# Patient Record
Sex: Male | Born: 1945 | Race: Black or African American | Hispanic: No | Marital: Married | Smoking: Never smoker
Health system: Southern US, Community
[De-identification: ages and names within clinical notes are randomized; demographics above are authoritative.]

## PROBLEM LIST (undated history)

## (undated) DIAGNOSIS — Z7401 Bed confinement status: Secondary | ICD-10-CM

## (undated) DIAGNOSIS — H409 Unspecified glaucoma: Secondary | ICD-10-CM

## (undated) DIAGNOSIS — J189 Pneumonia, unspecified organism: Secondary | ICD-10-CM

## (undated) DIAGNOSIS — R4182 Altered mental status, unspecified: Secondary | ICD-10-CM

## (undated) DIAGNOSIS — E119 Type 2 diabetes mellitus without complications: Secondary | ICD-10-CM

## (undated) DIAGNOSIS — R195 Other fecal abnormalities: Secondary | ICD-10-CM

## (undated) DIAGNOSIS — F32A Depression, unspecified: Secondary | ICD-10-CM

## (undated) DIAGNOSIS — E039 Hypothyroidism, unspecified: Secondary | ICD-10-CM

## (undated) DIAGNOSIS — E162 Hypoglycemia, unspecified: Secondary | ICD-10-CM

## (undated) DIAGNOSIS — B1920 Unspecified viral hepatitis C without hepatic coma: Secondary | ICD-10-CM

## (undated) DIAGNOSIS — I639 Cerebral infarction, unspecified: Secondary | ICD-10-CM

## (undated) DIAGNOSIS — I679 Cerebrovascular disease, unspecified: Secondary | ICD-10-CM

## (undated) DIAGNOSIS — K759 Inflammatory liver disease, unspecified: Secondary | ICD-10-CM

## (undated) DIAGNOSIS — R519 Headache, unspecified: Secondary | ICD-10-CM

## (undated) DIAGNOSIS — N179 Acute kidney failure, unspecified: Secondary | ICD-10-CM

## (undated) DIAGNOSIS — E7800 Pure hypercholesterolemia, unspecified: Secondary | ICD-10-CM

## (undated) DIAGNOSIS — I6990 Unspecified sequelae of unspecified cerebrovascular disease: Secondary | ICD-10-CM

## (undated) DIAGNOSIS — I10 Essential (primary) hypertension: Secondary | ICD-10-CM

## (undated) DIAGNOSIS — N189 Chronic kidney disease, unspecified: Secondary | ICD-10-CM

## (undated) DIAGNOSIS — I5042 Chronic combined systolic (congestive) and diastolic (congestive) heart failure: Secondary | ICD-10-CM

## (undated) DIAGNOSIS — N184 Chronic kidney disease, stage 4 (severe): Secondary | ICD-10-CM

## (undated) DIAGNOSIS — K219 Gastro-esophageal reflux disease without esophagitis: Secondary | ICD-10-CM

## (undated) DIAGNOSIS — R51 Headache: Secondary | ICD-10-CM

## (undated) DIAGNOSIS — F329 Major depressive disorder, single episode, unspecified: Secondary | ICD-10-CM

## (undated) HISTORY — DX: Hypoglycemia, unspecified: E16.2

## (undated) HISTORY — DX: Chronic kidney disease, unspecified: N18.9

## (undated) HISTORY — DX: Other fecal abnormalities: R19.5

## (undated) HISTORY — DX: Unspecified sequelae of unspecified cerebrovascular disease: I69.90

## (undated) HISTORY — DX: Unspecified viral hepatitis C without hepatic coma: B19.20

## (undated) HISTORY — DX: Altered mental status, unspecified: R41.82

## (undated) HISTORY — DX: Acute kidney failure, unspecified: N17.9

## (undated) HISTORY — PX: COLON SURGERY: SHX602

## (undated) HISTORY — DX: Chronic kidney disease, stage 4 (severe): N18.4

---
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
## 1999-11-01 ENCOUNTER — Ambulatory Visit (HOSPITAL_COMMUNITY): Admission: RE | Admit: 1999-11-01 | Discharge: 1999-11-01 | Payer: Self-pay | Admitting: Surgery

## 1999-11-01 ENCOUNTER — Encounter: Payer: Self-pay | Admitting: Surgery

## 2000-02-22 ENCOUNTER — Encounter: Admission: RE | Admit: 2000-02-22 | Discharge: 2000-02-22 | Payer: Self-pay | Admitting: Nephrology

## 2000-02-22 ENCOUNTER — Encounter: Payer: Self-pay | Admitting: Nephrology

## 2000-03-21 ENCOUNTER — Ambulatory Visit (HOSPITAL_COMMUNITY): Admission: RE | Admit: 2000-03-21 | Discharge: 2000-03-21 | Payer: Self-pay | Admitting: *Deleted

## 2000-03-21 ENCOUNTER — Encounter (INDEPENDENT_AMBULATORY_CARE_PROVIDER_SITE_OTHER): Payer: Self-pay | Admitting: Specialist

## 2000-03-25 HISTORY — PX: COLONOSCOPY W/ BIOPSIES AND POLYPECTOMY: SHX1376

## 2000-04-28 ENCOUNTER — Inpatient Hospital Stay (HOSPITAL_COMMUNITY): Admission: RE | Admit: 2000-04-28 | Discharge: 2000-05-05 | Payer: Self-pay | Admitting: General Surgery

## 2000-04-28 ENCOUNTER — Encounter (INDEPENDENT_AMBULATORY_CARE_PROVIDER_SITE_OTHER): Payer: Self-pay | Admitting: *Deleted

## 2000-05-01 ENCOUNTER — Encounter (HOSPITAL_BASED_OUTPATIENT_CLINIC_OR_DEPARTMENT_OTHER): Payer: Self-pay | Admitting: General Surgery

## 2000-10-10 ENCOUNTER — Encounter: Payer: Self-pay | Admitting: Emergency Medicine

## 2000-10-10 ENCOUNTER — Emergency Department (HOSPITAL_COMMUNITY): Admission: EM | Admit: 2000-10-10 | Discharge: 2000-10-10 | Payer: Self-pay | Admitting: Emergency Medicine

## 2006-10-24 DIAGNOSIS — J189 Pneumonia, unspecified organism: Secondary | ICD-10-CM

## 2006-10-24 HISTORY — DX: Pneumonia, unspecified organism: J18.9

## 2006-11-05 ENCOUNTER — Emergency Department (HOSPITAL_COMMUNITY): Admission: EM | Admit: 2006-11-05 | Discharge: 2006-11-05 | Payer: Self-pay | Admitting: Emergency Medicine

## 2006-11-11 ENCOUNTER — Ambulatory Visit: Payer: Self-pay | Admitting: Cardiology

## 2006-11-11 ENCOUNTER — Inpatient Hospital Stay (HOSPITAL_COMMUNITY): Admission: EM | Admit: 2006-11-11 | Discharge: 2006-11-14 | Payer: Self-pay | Admitting: Emergency Medicine

## 2006-11-12 ENCOUNTER — Encounter (INDEPENDENT_AMBULATORY_CARE_PROVIDER_SITE_OTHER): Payer: Self-pay | Admitting: Internal Medicine

## 2006-11-13 ENCOUNTER — Encounter (INDEPENDENT_AMBULATORY_CARE_PROVIDER_SITE_OTHER): Payer: Self-pay | Admitting: Interventional Radiology

## 2006-11-20 ENCOUNTER — Ambulatory Visit (HOSPITAL_COMMUNITY): Admission: RE | Admit: 2006-11-20 | Discharge: 2006-11-20 | Payer: Self-pay | Admitting: Internal Medicine

## 2007-01-24 DIAGNOSIS — I639 Cerebral infarction, unspecified: Secondary | ICD-10-CM

## 2007-01-24 HISTORY — DX: Cerebral infarction, unspecified: I63.9

## 2007-02-02 ENCOUNTER — Ambulatory Visit: Payer: Self-pay | Admitting: *Deleted

## 2007-02-02 ENCOUNTER — Encounter (INDEPENDENT_AMBULATORY_CARE_PROVIDER_SITE_OTHER): Payer: Self-pay | Admitting: Internal Medicine

## 2007-02-02 ENCOUNTER — Inpatient Hospital Stay (HOSPITAL_COMMUNITY): Admission: EM | Admit: 2007-02-02 | Discharge: 2007-02-05 | Payer: Self-pay | Admitting: Emergency Medicine

## 2007-02-04 ENCOUNTER — Ambulatory Visit: Payer: Self-pay | Admitting: Physical Medicine & Rehabilitation

## 2007-02-05 ENCOUNTER — Inpatient Hospital Stay (HOSPITAL_COMMUNITY)
Admission: RE | Admit: 2007-02-05 | Discharge: 2007-02-18 | Payer: Self-pay | Admitting: Physical Medicine & Rehabilitation

## 2007-03-17 ENCOUNTER — Encounter
Admission: RE | Admit: 2007-03-17 | Discharge: 2007-06-04 | Payer: Self-pay | Admitting: Physical Medicine & Rehabilitation

## 2007-03-17 ENCOUNTER — Ambulatory Visit: Payer: Self-pay | Admitting: Physical Medicine & Rehabilitation

## 2007-04-07 ENCOUNTER — Encounter
Admission: RE | Admit: 2007-04-07 | Discharge: 2007-07-06 | Payer: Self-pay | Admitting: Physical Medicine & Rehabilitation

## 2007-05-15 ENCOUNTER — Encounter
Admission: RE | Admit: 2007-05-15 | Discharge: 2007-05-15 | Payer: Self-pay | Admitting: Physical Medicine & Rehabilitation

## 2009-10-30 ENCOUNTER — Encounter: Admission: RE | Admit: 2009-10-30 | Discharge: 2009-10-30 | Payer: Self-pay | Admitting: Infectious Diseases

## 2010-04-15 ENCOUNTER — Encounter: Payer: Self-pay | Admitting: Internal Medicine

## 2010-08-07 NOTE — H&P (Signed)
[name], [name]          ACCOUNT NO.:  [id number]   MEDICAL RECORD NO.:  [id number]          PATIENT TYPE:  INP   LOCATION:  4731                         FACILITY:  [facility]   PHYSICIAN:  Fleet Contras, M.D.    DATE OF BIRTH:  Jun 18, 1945   DATE OF ADMISSION:  11/11/2006  DATE OF DISCHARGE:                              HISTORY & PHYSICAL   PRESENTING COMPLAINTS:  Left-sided chest pain and chest congestion.   HISTORY OF PRESENTING COMPLAINT:  [name] is a 65 year old  African-American gentleman with past medical history significant for  type 2 diabetes mellitus, hypertension.  He was seen at the emergency  room on November 05, 2006 with 1-week history of progressively worsening  symptoms of pain on the left side associated with chest congestion,  cough productive of yellowish sputum as well as generally feeling  unwell, fatigued, and worn out.  He denied any specific fevers or  chills.  He stated that the pain in the chest was worse with coughing,  with deep breathing or with any movement.  At that time he was evaluated  and found to have left lower lobe infiltrate on chest x-ray and was  treated with antibiotics, Zithromax for five days as well as Vicodin for  pain.  He returned to the emergency room today with complaints of  persistent symptoms not completely resolved with the appropriate therapy  prescribed.  He denied any palpitations, nausea or vomiting or  diaphoresis.  He had no orthopnea or PND.  Repeat x-ray today showed  worsening of the left lower lobe infiltrates as well as parapneumonic  effusion on the left side.  In view of his comorbid medical conditions  and worsening of his symptoms and chest x-ray findings in spite of  outpatient therapy, he is being hospitalized for  further intravenous therapy and close monitoring.   PAST MEDICAL HISTORY:  Significant for:  1. Type 2 diabetes mellitus.  2. Hypertension systemic.  3. Glaucoma.  4. Lumbar  spondylosis.   MEDICATION:  He is on Avandia 4 mg daily, aspirin 81 mg daily, Ambien 10  mg one q.h.s. p.r.n., glipizide 5 mg b.i.d., metformin 500 mg b.i.d.,  Accupril 40 mg daily.   ALLERGIES:  He has no known drug allergies.   SURGICAL HISTORY:  Colonoscopy, polypectomy in 2002.   FAMILY HISTORY/SOCIAL HISTORY:  He is employed.  He is married and lives  with his family.  He has four healthy children and four siblings.  His  mother is alive with hypertension and diabetes.  His father is deceased  of natural causes.  He does not use any alcohol, tobacco or illicit  drugs.  Unfortunately he has been very noncompliant with his medical  therapy as well as follow-up. He was last seen in my office in September  2007.   REVIEW OF SYSTEMS:  Essentially as above.   PHYSICAL EXAMINATION:  GENERAL:  He is an elderly gentleman not in acute  respiratory or painful distress.  He is not pale.  He is not icteric.  He is not cyanosed.  He is well-hydrated.  VITAL SIGNS:  Stable.  HEENT:  His pupils are equal and react to light and accommodation.  NECK:  Supple with no elevated JVD.  No cervical adenopathy.  No carotid  bruit.  CHEST:  Shows reduced air entry in the left base with some rales.  There  are no wheezes or rhonchi.  HEART:  Heart sounds 1 and 2 are heard with no murmurs, no S3 gallops.  ABDOMEN:  Soft, nontender, no masses.  Bowel sounds are present.  EXTREMITIES:  Show no edema, no calf tenderness or swelling.  CNS:  He is alert, oriented x3 with no focal neurological deficits.   LABORATORY DATA:  Chest x-ray from this morning showed increased air  space disease involving the left lower lobe and increase in left  effusion.  Blood cultures x2 sets were obtained in the emergency room.   Sodium is 131, potassium 5.3, chloride 100, bicarbonate 26, BUN 13,  creatinine 1.1, glucose 570.   ASSESSMENT:  [name] is a 65 year old African-American gentleman  with past medical  history significant for hypertension, diabetes, and  noncompliance with prescribed therapy.  He presents to the emergency  room with left-sided chest pain and chest congestion associated with  cough productive of yellowish sputum.  He has failed outpatient therapy  with Zithromax, and chest x-ray today revealed progression of left lower  lobe infiltrates and effusion.  He is being hospitalized for inpatient  therapy.   ADMISSION DIAGNOSES:  1. Pneumonia.  2. Type 2 diabetes mellitus uncontrolled.  His last hemoglobin A1c at      his last office visit on November 27, 2005 was 9.1.  3. Hyperkalemia.  This is most likely due to uncontrolled diabetes and      is expected to correct once his sugars are better controlled.   PLAN OF CARE:  He will be admitted to a telemetry bed.  He will be on 2  g sodium, carbohydrate modified diet.  Will be on activity as tolerated.  Vital signs q.4 h.  CBGs will be checked a.c. and h.s., strict input and  output monitoring.  He will be on IV Rocephin 1 g q.24 h. and IV  Zithromax 100 mg daily.  His home medication will be continued as above.  Insulin 20 units subcutaneously stat followed by NovoLog insulin per  resistance scale, IV fluids 1/2 normal saline at 100 cc an hour, Vicodin  5/500 1-2 tablets q.6 h. p.r.n. for pain.  The plan of care has been  discussed with him and his questions answered.      Fleet Contras, M.D.  Electronically Signed     EA/MEDQ  D:  11/11/2006  T:  11/12/2006  Job:  [id number]

## 2010-08-07 NOTE — Discharge Summary (Signed)
NAMEHENSON, FRATICELLI          ACCOUNT NO.:  [id number]   MEDICAL RECORD NO.:  [id number]          PATIENT TYPE:  INP   LOCATION:  4731                         FACILITY:  [facility]   PHYSICIAN:  Fleet Contras, M.D.    DATE OF BIRTH:  02/15/1946   DATE OF ADMISSION:  11/11/2006  DATE OF DISCHARGE:  11/14/2006                               DISCHARGE SUMMARY   HISTORY OF PRESENT ILLNESS:  Please see complete history and physical  performed on November 11, 2006 for full details.  In summary, Mr.  Curci is a 61-year African-American gentleman with past medical  history significant for type 2 diabetes mellitus and hypertension.  He  was admitted via the emergency room at [facility] after his  second visit there, for a cough productive of yellowish sputum, chest  congestion and left-sided chest pain.  He had been treated with Z-Pak, a  week prior to room his return.  His symptoms did not completely resolve,  and a repeat chest x-ray performed in the emergency room revealed a  worsening of his left lower lobe infiltrates, as well as effusion on the  left side.  He was therefore admitted to the hospital for failed  outpatient therapy.  On admission, his vital signs were stable.  He was  afebrile.  He was not icteric.  He was not cyanotic, and his chest  showed reduced air entry in the left base with rales.   INITIAL LABORATORY DATA:  Showed white count of 11,000.  Potassium was  5.3, chloride was 100, glucose was 570, sodium was 131, BUN was 13 and  creatinine was 1.1.   HOSPITAL COURSE:  On admission, he was started on intravenous  antibiotics with Rocephin 1 gram q.24 h. and Zithromax 500 mg daily.  This was subsequently changed to oral Avelox 400 mg daily.  He had a low  grade fever at the hospital of 100.9 maximum, and his condition began to  improve.  His chest pain also improved on Vicodin 5/500 1-2 tablets q.6  p.r.n.  Repeat chest x-ray on 11/12/2006 showed persistent  infiltrates,  as well as effusion, and this suggested ultrasound scan and  thoracentesis which was performed on November 13, 2006, with removal of  180 mL of clear fluid, and this was sent for laboratory evaluation.  Other laboratory tests performed included a lipid profile, which showed  a total cholesterol 109, LDL of 55, HDL 42, triglycerides of 58.  His  liver function test was within normal limits.  Hemoglobin A1c was 12.1.  A 2-D echocardiogram showed normal LV ejection systolic function of 55%  to 60%.  Post- procedure chest x-ray showed no pneumothorax.  This  morning, he continues to feel better, and he has been ambulating.  He  had some left-sided chest pain which is mild with cough.  He is eating  well.  He has regular bowel movements.  He is afebrile.  Blood pressure  is 130/79, heart rate is 89, respiratory rate is 20, temperature is  98.2, and O2 sats on room air is 95%.  His chest shows still reduced air  entry  in the left base, with few rales.  Heart sounds 1 and 2 are heard  with no murmurs.   LABORATORY DATA:  From this morning, shows a white count of 7.6,  hemoglobin 12.8, hematocrit 37.1, platelet count of 320.  Sodium is 139,  potassium 3.7, chloride 104, bicarbonate of 27, BUN is 11, creatinine  0.9 and glucose is 101.   ASSESSMENT:  Mr. Riecke has improved from his left lower lobe  pneumonia and effusion.  He is considered stable for discharge home  today.   DISCHARGE DIAGNOSES:  1. Left lower lobe pneumonia.  2. Left-sided pleural effusion, parapneumonic.  Evaluation of his      pleural fluids, so far showed a negative Gram's stain, protein of      4.2, WBC of 1,270 with 58 polys.  This is considered to be      transudative at this point, and culture is still pending.  3. Type 2 diabetes mellitus.  He required sliding scale NovoLog      insulin per persistent scale in the hospital, and his home regimen      of glipizide 5 mg b.i.d. was increased to  glipizide 10 mg b.i.d.,      and he was started on Actos 15 mg daily.  These were monitored in      the outpatient department.   DISCHARGE MEDICATIONS:  Includes:  1. Quinapril 40 mg daily.  2. Metformin 500 mg b.i.d.  3. Glipizide 10 mg b.i.d.  4. Actos 15 mg daily.  5. Vicodin 5/500 1-2 tablets q.6 h. p.r.n.  6. Avelox 400 mg once a day for 10 days.   He will follow up with me in the office next week and also obtain a  chest x-ray to evaluate the pneumonia and effusion.  This plan of care  has been discussed with him and his questions answered.      Fleet Contras, M.D.  Electronically Signed     EA/MEDQ  D:  11/14/2006  T:  11/15/2006  Job:  [id number]

## 2010-08-07 NOTE — Discharge Summary (Signed)
NAMEJHORDAN, [name] NO.:  [id number]   MEDICAL RECORD NO.:  [id number]          PATIENT TYPE:  IPS   LOCATION:  4037                         FACILITY:  [facility]   PHYSICIAN:  Ranelle Oyster, M.D.DATE OF BIRTH:  Dec 30, 1945   DATE OF ADMISSION:  02/05/2007  DATE OF DISCHARGE:  02/18/2007                               DISCHARGE SUMMARY   DISCHARGE DIAGNOSES:  1. Left corona radiata infarct.  2. Intracranial sclerosis.  3. Diabetes mellitus type 2.  4. Hypertension.  5. Dyslipidemia.   HISTORY OF PRESENT ILLNESS:  [name] is a 65 year old male with  history of hypertension, diabetes mellitus, admitted February 02, 2007  with 2- to 3-day history of right-sided weakness and unsteadiness as  well as fall. CCT done showed left corona radiata infarct, likely  subacute.  Carotid Dopplers done showed no ICA stenosis.  A 2-D echo  done showed EF of 60-65% with mild diastolic dysfunction.  MRI/MRA of  brain showed acute infarct in left corona radiata, probable small vessel  disease type changes, moderate sinus disease, severe tight focal  stenosis in segment of left ICA and moderate stenosis question left MCA,  right MCA and vertebral basilar junction. Cerebral angio done showed the  patient with 70-75% stenosis left MCA, 50% stenosis right MCA,  approximately 25% stenosis of distal left ICA cavernous segment and  severe preocclusive stenosis left ACA at origin.  Patient was placed on  Plavix for CVA prophylaxis with question of SAMRISS trial past  discharge.  Therapies initiated and patient is noted to have problems  with mobility requiring min assist with setup plus for ambulating few  feet.  Continues with right hemiparesis, lower extremity greater than  upper extremity, with OT working on upper extremity exercises.  Secondary to impairments in mobility and self-care, rehab consulted for  further therapies.   PAST MEDICAL HISTORY:  Significant for lumbar  spondylosis, DM type 2,  hypertension, history of left thoracocentesis, past pneumonia in August  2008, colon polyps and negative exploratory lap in 2004.   ALLERGIES:  NO KNOWN DRUG ALLERGIES.   FAMILY HISTORY:  Unknown.   SOCIAL HISTORY:  The patient is married.  Lives in two-level home with  no steps at entry, 14 steps inside.  He is a native of [place].  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  Does not use any tobacco or alcohol.   HOSPITAL COURSE:  [name] was admitted to rehab on  February 05, 2007 for inpatient therapies to consist of PT, OT daily.  Labs done past admission revealed hemoglobin 14.0, hematocrit 40.7,  white count 5.2, platelets 155.  Check of lytes revealed sodium 139,  potassium 3.7, chloride 105, CO2 of 28, BUN 9, creatinine 0.94, glucose  134.  Patient's diabetes has been monitored with a.c. and at bedtime CBG  checks.  Lantus insulin has been adjusted for tighter blood sugar  control.  Actos was deceased secondary to episodic hypoglycemic  episodes.  Blood sugars at time of discharge ranging from 80-130 range.  P.o. intake has been good.  Blood pressures at time of admission were  noted to be elevated with systolics in 170s.  HCTZ 12.5 was added and  titrated to 25 mg a day.  Patient's blood pressures were better improved  on this; however, patient was noted to have problems with urgency and  incontinence.  HCTZ was dropped down to 12.5 mg p.o. per day.  Blood  pressures at time of discharge ranging from 110-120 systolic, 60s-70s  diastolic.  A UA/UC was done to rule out a UTI and culture currently  pending.  Patient has been maintained on Plavix throughout his stay for  CVA prophylaxis.  Patient's right upper extremity strength has been  improving with patient having initiation of shoulder movement as well as  activation of biceps/triceps.  Right lower extremity strength is slowly  improving with patient continuing to have problems with knee  flexion as  well as decrease in dorsiflexion.  An AFO was ordered to help with gait.  Patient's mood has been stable.  He has been motivated and has  progressed along well in therapies.  At time of discharge, patient is at  supervision for bathing and dressing, modified independent for toilet  transfers.  He is at modified independent for basic transfers, min  supervision for ambulating 150 feet, min assist for car transfers, min  assist to navigate 15 stairs.  He will continue to have further follow-  up home health PT, OT by [facility] past discharge.  On February 18, 2007, patient is discharged to home.   DISCHARGE MEDICATIONS:  Prinivil 40 mg a day, Prilosec OTC 20 mg per day  p.r.n., Lipitor 10 mg at bedtime, Plavix 75 mg a day, metformin 500 mg  b.i.d., Lantus insulin 28 units subcu at bedtime, HCTZ 12.5 mg a day.  Use sliding scale insulin per home protocol.   DIET:  Diabetic diet.   ACTIVITY:  Is 24-hour supervision.  No strenuous activity.  No alcohol,  no smoking, no driving.   SPECIAL INSTRUCTIONS:  To not use Actos.   HOME HEALTH:  PT, OT by [facility].   FOLLOW UP:  The patient to follow up with Dr. Concepcion Elk for routine check  in 2 weeks.  Follow up with Dr. Pearlean Brownie in 4-6 weeks for input regarding  repeat angio as well as SAMRISS trial.  Follow up with Dr. Riley Kill  December 29 at 11:20.      Greg Cutter, P.A.      Ranelle Oyster, M.D.  Electronically Signed    PP/MEDQ  D:  02/18/2007  T:  02/19/2007  Job:  [id number]   cc:   Pramod P. Pearlean Brownie, MD  Fleet Contras, M.D.

## 2010-08-07 NOTE — Consult Note (Signed)
NAMELEONIDUS, ROWAND          ACCOUNT NO.:  [id number]   MEDICAL RECORD NO.:  [id number]          PATIENT TYPE:  INP   LOCATION:  3003                         FACILITY:  [facility]   PHYSICIAN:  Bevelyn Buckles. Champey, M.D.DATE OF BIRTH:  [birth year]   DATE OF CONSULTATION:  DATE OF DISCHARGE:                                 CONSULTATION   TYPE OF CONSULT:  Neurology.   REFERRING PHYSICIAN:  Fleet Contras, M.D.   REASON FOR CONSULTATION:  Stroke.   HISTORY OF PRESENT ILLNESS:  Mr. Nghiem is a 64 year old African-  American male with multiple medical problems presents with a 2 to 3 day  history of right-sided weakness.  The patient stated his symptoms  started last Friday when he woke up in the morning with right lower  extremity weakness and he went to his primary care physician, got a CT  of the head, results of which are unknown.  The patient's symptoms  persisted.  When he woke up Sunday morning, he had right and lower  extremity weakness and was very unsteady on his feet and actually fell  after breakfast.  Went to the emergency room and CT of the head showed a  left corona radiata deep white matter infarct and chronic white matter  disease.  The patient was admitted for evaluation.  He denied any other  symptoms such as numbness, tingling, vision changes, speech changes,  swallowing problems, chewing problems, dizziness, vertigo or loss of  consciousness.   PAST MEDICAL HISTORY:  Positive for:  1. Hypertension.  2. Diabetes.  3. Lumbar spondylosis.   CURRENT MEDICATIONS:  1. Accupril.  2. Metformin.  3. Actos.  4. Lipitor.  5. Aspirin.   ALLERGIES:  NO KNOWN DRUG ALLERGIES.   FAMILY HISTORY:  Positive for hypertension and diabetes.   SOCIAL HISTORY:  The patient lives with his daughter.  Denies any  tobacco, alcohol or drug use.   REVIEW OF SYSTEMS:  Positive as per HPI.  Review of systems negative as  per HPI and greater than 7 other organ systems.   PHYSICAL  EXAMINATION:  VITAL SIGNS:  Temperature is 98.0, blood pressure  149/88, respirations 18, pulse 75.  HEENT:  Normocephalic, atraumatic.  Extraocular muscles intact. Visual  fields intact.  Face symmetrical.  NECK: Supple.  No carotid bruits are heard.  HEART:  Regular.  LUNGS:  Clear.  ABDOMEN:  Soft.  EXTREMITIES:  Show good pulses.  NEUROLOGIC:  The patient is awake, alert, language is fluent.  The  patient is following commands appropriately.  Cranial nerves II-XII are  grossly intact.  Motor examination the patient has 3 to 4 out of 5  strength in the right upper extremity, 1 to 2 out of 5 strength in right  lower extremity.  Left side strength is 5/5.  Sensory examination is  within normal limits to light touch.  Reflexes are trace, right toes are  upgoing, on the left is downgoing.  Cerebellar function, no ataxia is  noted.  Gait was not assessed secondary to safety.   IMAGING/LABS:  CT of the head showed a deep left corona radiata infarct  with small vessel ischemia.  Labs: WBC 5.0, hemoglobin 14.9, hematocrit  43.5, platelets 139, sodium 137, potassium 3.5, chloride 103, CO2 26,  BUN 16, creatinine 1.0, glucose 197, PT 13.1.  INR is 1.0.   IMPRESSION:  This is a 65 year old African-American male with right-  sided weakness times 2 to 3 days, consistent with a subcortical infarct.  The patient not a candidate for IV tissue plasminogen activator as his  symptoms are greater than 3 hours out. Recommend getting an MRI/MRA of  the brain, due echocardiogram, carotid Dopplers, lipids, homocysteine  level, hemoglobin A1c.  We will start Plavix 75 mg per day and  discontinue his aspirin.  We will get physical therapy and occupational  therapy consults.  We will follow the patient while he is in the  hospital.      Bevelyn Buckles. Nash Shearer, M.D.  Electronically Signed     DRC/MEDQ  D:  02/02/2007  T:  02/02/2007  Job:  [id number]

## 2010-08-07 NOTE — Discharge Summary (Signed)
NAMESWAYZE, PRIES NO.:  [id number]   MEDICAL RECORD NO.:  [id number]          PATIENT TYPE:  IPS   LOCATION:  4037                         FACILITY:  [facility]   PHYSICIAN:  Ellwood Dense, M.D.   DATE OF BIRTH:  Aug 23, 1945   DATE OF ADMISSION:  02/05/2007  DATE OF DISCHARGE:                               DISCHARGE SUMMARY   ADMITTING PHYSICIAN:  Faith Rogue, M.D.   PRIMARY CARE DOCTOR:  Dr. Concepcion Elk.   NEUROLOGIST:  Dr. Pearlean Brownie.   HISTORY OF PRESENT ILLNESS:  Mr. Whitford is a 65 year old [place]  male with history of hypertension, along with diabetes mellitus.  He ws  admitted February 02, 2007 with a 2 to 3-day history of right-sided  weakness with unsteadiness in gait and pulse prior to admission.   On admission, cranial CT showed left corona radiata infarct, likely  subacute.  Carotid Dopplers showed no internal carotid artery stenosis.  A 2-D echocardiogram showed an ejection fraction of 60% to 65% with mild  diastolic dysfunction.  MRA and MRI study of the brain showed acute  infarct in the left carotid radiata, with probable small vessel type  changes, moderate sinusitis, severe to tight focal stenosis of the  segment of the left internal carotid artery with moderate stenosis of  the left middle cerebral artery, right middle cerebral artery, and  vertebral body junction.  Cerebral angiogram was done, and the patient  had 70% to 75% stenosis of the left middle cerebral artery and 50%  stenosis of the right middle cerebral artery, with approximately 25%  stenosis of the left internal carotid artery in the distal catheter  segment.  There was also severe pre-occlusive stenosis in the left  anterior cerebral artery, at the origin.  The patient was placed on  Plavix for stroke prophylaxis.  There is a plan for possible SAMRISS  trial after discharge.   Therapies have been initiated, and the patient is able to ambulate 6  feet with Sera Plus with min  to guard assist and verbal cues for the  right lower extremity, weight shifting and posturing.  Occupational  therapy has working on right upper extremity exercises with persistent  right hemiparesis with no dysarthria or dysphagia.   The patient was evaluated by the rehabilitation physicians and felt to  be an appropriate candidate for inpatient dilatation.   REVIEW OF SYSTEMS:  Positive for weakness.   PAST MEDICAL HISTORY:  1. History of lumbar spondylosis.  2. Type 2 diabetes mellitus.  3. Hypertension.  4. Left thoracentesis after pneumonia in August 2008.  5. Prior colon polyps in 2004.  6. Excision laparotomy which was negative in 2004   FAMILY HISTORY:  Unknown.   SOCIAL HISTORY:  The patient lives with his wife in a two level home  with approximately 14 steps to enter.  He is a native of [place], but  has been living here for several years.  He does not use alcohol or  tobacco.  Two daughters also live in the house with he and his wife.  One daughter attends high school, and another daughter works from home  in  a consulting business.  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   FUNCTIONAL HISTORY:  Prior to admission:  Independent and working.   ALLERGIES:  NO KNOWN DRUG ALLERGIES.   MEDICATIONS:  Prior to admission:  1. Actos 15 mg daily.  2. Aspirin 325 mg.  3. Accupril 40 mg daily.  4. Lipitor 10 mg daily.  5. Metformin 500 mg b.i.d.  6. Lantus insulin 20 units daily.  7. Sliding scale insulin for a blood sugar greater than 150.   LABORATORY:  Recent hemoglobin was 14.9 with hematocrit of 43.5,  platelet count of 139,000, white count of 5.0.  Recent hemoglobin A1c  was 9.6 (elevated).  Recent cholesterol was 161 with LDL cholesterol  100, HDL cholesterol 50, triglycerides of 53.  Recent sodium was 135,  potassium 3.5, chloride 103, bicarbonate 25, BUN 10, creatinine 0.18 and  glucose 176 with AST of 55, ALT of 44 and albumin  of 3.3.   PHYSICAL EXAM:  GENERAL:  Well-appearing, fit adult male lying in bed in  no acute discomfort.  VITAL SIGNS:  Blood pressure 138/89 with pulse 74, respiratory rate 18  and temperature 9.2.  Recent CBGs have been 140, 159, and 164.  HEENT:  Normocephalic, nontraumatic.  CARDIOVASCULAR:  Regular rate and rhythm, S1-S2 without murmurs.  ABDOMEN:  Soft, nontender with positive bowel sounds.  LUNGS:  Clear to auscultation bilaterally.  NEUROLOGIC:  Alert and oriented x3.  Cranial nerves II-XII were grossly  intact with tongue in the midline and extraocular muscles intact.  EXTREMITIES:  Examination of the left side showed 5/5 strength  throughout.  Bulk and tone were normal, and reflexes 2+ and symmetrical.  Right upper extremity exam showed elbow movement of approximately 2-/5  with grip of 2/5.  Otherwise, strength was 1-2/5 in the right upper  extremity, with normal sensation.  Right lower extremity exam showed hip  flexion at 1/5 and extension 1+/5.  Ankle dorsiflexion was 0-1/5.  Sensation was intact to light touch at the right lower extremity.   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  Presently, the  patient has deficits in ADLs, transfers and ambulation related to the  above-noted left corona radiata infarct with right hemiparesis.   PLAN:  1. Admit to the rehabilitation unit for daily therapies to include      physical therapy, full range of motion, strengthening, bed      mobility, transfers, pre-gait training, gait training and equipment      eval.  Occupational therapy for range of motion, strengthening,      ADLs, cognitive/perceptual training, splinting and equipment      evaluate.  2. Rehab nursing for skin care, wound care and bowel and bladder      training, as necessary.  3. Speech therapy for high-level cognition and evaluation of facial      weakness as necessary.  4. Case  management to assess home environment, assist with discharge      planning and arrange for appropriate follow-up care.  5. Social worker to assess family and social support and assist in      discharge planning.  6. Continue carbohydrate-modified medium, dietary restriction.  7. Check admission labs including:  CBC and CMET in the a.m. February 06, 2007.  8. OxyContin 5 mg 1 to 2 tablets p.o. q.4 h. p.r.n. for pain.  9. Sliding  scale NovoLog insulin.  10.Prinivil 40 mg p.o.  11.Protonix 40 mg p.o. q.  12.Lipitor 10 mg p.o. daily.  13.Actos 15 mg p.o.  14.Plavix 75 mg p.o. daily.  15.Lantus insulin 20 units subcu q.h.s.  16.Lovenox 40 mg subcu daily.  17.Glucophage 500 mg p.o. b.i.d., resuming February 06, 2007.  18.Registered dietician, consult for carbohydrate-modified diet to be      carried on at home.  19.Grounds pass with staff and family p.r.n.   PROGNOSIS:  Good.   ESTIMATED LENGTH OF STAY:  Two to three weeks.   GOALS:  Standby assist for ADLs and min assist transfers with modified  independent to standby assist wheelchair mobility and moderate-assist  ambulation.           ______________________________  Ellwood Dense, M.D.     DC/MEDQ  D:  02/05/2007  T:  02/06/2007  Job:  [id number]

## 2010-08-07 NOTE — Discharge Summary (Signed)
NAMEJAHKARI, [name] NO.:  [id number]   MEDICAL RECORD NO.:  [id number]          PATIENT TYPE:  INP   LOCATION:  3003                         FACILITY:  [facility]   PHYSICIAN:  Fleet Contras, M.D.    DATE OF BIRTH:  05-30-1945   DATE OF ADMISSION:  02/01/2007  DATE OF DISCHARGE:  02/05/2007                               DISCHARGE SUMMARY   HISTORY OF PRESENTING ILLNESS:  [name] is a 65 year old [place]-  American gentleman with a past medical history significant for type 2  diabetes mellitus, systemic hypertension, glaucoma and poor compliance  with medical therapy.  He presented to the emergency room at [facility] on February 02, 2007 with a 2-day history of progressively  worsening weakness of the right leg with unsteadiness in gait and two  episodes of falls.  He had been seen in the office with slight weakness  on the right leg which apparently resolved spontaneously, but his  symptoms recurred the day after and progressively worsened until he got  to the emergency room.  He did not have any slurring of speech or  blurring of vision, headaches, seizures or syncope.  At the emergency  room, his blood pressure was elevated and initial CT scan of the head  showed a subacute infarct involving the left coronary radiata and he had  a right-sided hemiparesis.  He was therefore admitted to the stroke unit  for further evaluation and appropriate therapy.   HOSPITAL COURSE:  On admission, he was admitted to the stroke unit of  [facility].  There, he was started on his home medications of  antihypertensives and diabetic medications.  He was on Lantus insulin 15  units q.h.s. and sliding scale NovoLog insulin.  DVT and GI prophylaxis  was also started.  A 2-D echocardiogram of the heart was performed and  this showed a normal ejection fraction at 60 to 65% with mild diastolic  dysfunction.  Carotid Doppler showed no internal carotid artery  stenosis.  MRI and MRA of the brain were performed and these confirmed  an acute infarct in the left coronary radiata with tight stenosis of the  M1 segment of the left internal carotid artery and left middle cerebral  artery, right anterior cerebral artery and vertebral body junction.  A  cerebral angiogram was performed and showed 70 to 75% stenosis of the  left middle cerebral artery and 50% stenosis of the right middle  cerebral artery with approximately 25% stenosis of the left internal  carotid artery in a distal cavernous segment.  There was also severe pre  occlusive stenosis in the left anterior cerebral artery and the origin.  The patient was placed on Plavix and aspirin was discontinued.  There is  a plan for him to undergo a possible Samriss trial after discharge from  rehabilitation.  Swallow evaluation was performed and the patient was  able to swallow without difficulty.  A PT and OT consult was requested  and the patient was recommended for rehabilitation.  Dr. Hermelinda Medicus  evaluated the patient and accepted the patient for admission to the  rehab unit  of the hospital.  Other laboratory data performed showed his  total cholesterol was 161, LDL 100, HDL 50, triglycerides of 53.  TSH  was 1.626, hemoglobin A1c was 9.6, homocysteine was 7.3.   On the day of discharge, he was sitting in the chair not in acute  respiratory or painful distress.  His vital signs showed a blood  pressure of 138/89, heart rate of 74, respiratory rate of 18,  temperature 97.2.  O2 sat on room air was 96%.  HEENT was normocephalic  and atraumatic.  Pupils were equal and reactive to light and  accommodation.  His neck was supple with no elevated JVD, no cervical  lymphadenopathy, no carotid bruit.  His chest shows good air entry  bilaterally with no rales, rhonchi or wheezes.  Heart sounds 1 and 2  were heard with no murmurs.  Abdomen was soft, nontender, no masses.  Bowel sounds were present.   Extremities showed no edema, no calf  tenderness or swelling.  CNS showed he was alert, oriented x3 with  cranial nerves II-XII grossly intact.  There was a right-sided  hemiparesis with power of about 2/5 in the upper extremity and 3/5 in  the lower extremity with weakness mostly distally.   ASSESSMENT:  [name] is a 65 year old African-American African-American gentleman  with multiple risk factors for coronary artery disease and  cerebrovascular disease who presented to the emergency room with right-  sided weakness and he has been found to have an infarct involving the  left coronary radiata.  He has remained hemodynamically stable and is  now being discharged to rehab.   DISCHARGE DIAGNOSES:  1. 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  2. Systemic hypertension.  3. Type 2 diabetes mellitus.  4. Poor compliance.   DISCHARGE MEDICATIONS:  1. Actos 50 mg daily.  2. Plavix 75 mg daily.  3. Protonix 40 mg daily.  4. Lipitor 10 mg daily.  5. Lantus insulin 20 units q.h.s.  6. Lovenox 40 mg subcutaneously daily.  7. Metformin 500 mg b.i.d.  8. Sliding scale NovoLog insulin per emergency protocol.   DISPOSITION:  Will be to inpatient rehabilitation.   CONDITION ON DISCHARGE:  Stable.   His plan of care was discussed with him and his questions were answered.      Fleet Contras, M.D.  Electronically Signed     EA/MEDQ  D:  02/07/2007  T:  02/09/2007  Job:  [id number]

## 2010-08-07 NOTE — Assessment & Plan Note (Signed)
The patient is back regarding his left corona radiata infarct.  He has  been home with therapy and doing well.  Still needs supervision for  impulsivity.  His daughter is with him all the time.  He denies pain.  He uses his walker generally for mobility.  He has had one fall, and  perhaps near misses otherwise.  He is sleeping well.  He does complain  of some urinary urgency.  He is working on his sugar control with Dr.  Concepcion Elk.  His insulin was recently decreased.  His sugars are running  low in the 60s throughout the day.   REVIEW OF SYSTEMS:  Notable for the above.  Full review is in the  written health history section in the chart.   SOCIAL HISTORY:  The patient is married, living with his wife and  children.  He recently received his Medicaid.   PHYSICAL EXAMINATION:  VITAL SIGNS:  Blood pressure 146/77, pulse 93,  respirations 18, he is satting 97% on room air.  GENERAL:  The patient is pleasant, alert and oriented x3.  Affect seems  generally bright and appropriate.  He walks with his walker, but we  tried some ambulation without and he tends to stumble mightily on the  left leg, especially with changing directions.  He is wearing his left  ASO.  He does much better with the walker in hand.  HEART:  Regular.  CHEST:  Clear.  ABDOMEN:  Soft, nontender.  NEUROLOGIC:  The patient's strength is near 4 to 4+/5 right upper  extremity.  He has a mild right pronator drift and some decreased fine  motor coordination in the right hand.  Right leg is 1+ to 2/5 proximal  to 0 to trace distally.  Left upper and lower extremities are 5/5.  Cognition is good.  Speech is intelligible.  Cranial nerves essentially  are intact today.   ASSESSMENT:  1. Left corona radiata infarct.  2. Diabetes.  3. Hypertension.   PLAN:  1. We will transition to outpatient therapy.  I think he will do well      there.  He may benefit from a Bioness trial.  Encouraged use of      walker for now as he is at  high risk for falling otherwise.  2. We will cut his nighttime Lantus insulin down to 12 units at night.      He may be able to come off this entirely.  I discussed tapering      plan with his daughter.  Consider titrating up his oral medications      to cover insulin alone.  3. Consider treating urinary urgency, but the patient elects to      observe for now.  4. I will see him back in two months' time.      Ranelle Oyster, M.D.  Electronically Signed     ZTS/MedQ  D:  03/23/2007 11:51:06  T:  03/23/2007 12:13:46  Job #:  [id number]   cc:   Fleet Contras, M.D.  Fax: [phone]

## 2010-08-10 NOTE — H&P (Signed)
[place]. [facility]  Patient:    [name], [name]                   MRN: [id number] Adm. Date:  [id number] Attending:  Sonda Primes CC:         Mardene Celeste. Lurene Shadow, M.D. (2)   History and Physical  ADMISSION DIAGNOSIS:  Status post sigmoid colectomy for adenomatous polyp of the sigmoid colon.  HISTORY OF PRESENT ILLNESS:  This patient is a 65 year old African man who was referred for the presence of a polypoid lesion of the sigmoid lesion, noted to be at approximately 35.0 to 33.0 cm away from the anal verge, as seen originally on screening barium enema.  Subsequently he underwent a colonoscopy, where the lesion was seen to be at approximately 35.0, and noted to be a rather sessile lesion.  Biopsies were taken at that time, showing no evidence of dysplasia or malignancy.  The patient is prepared then for an operation, and was taken to the operating room today, where he underwent a sigmoid colectomy.  At the time of operation, the previously-noted polyp was not identified.  PAST MEDICAL HISTORY: 1. History of adult onset diabetes mellitus, for which he takes 1.25 mg    of GlucoVance q.d. 2. Hypertension, for which he takes Accupril 2.5 mg q.d.  PAST SURGICAL HISTORY:  He has not had any previous surgery.  ALLERGIES:   He has No known allergies to medications or other substances.  SOCIAL HISTORY:  He does not use tobacco.  He does not use alcohol.  The patient is a married black male, living in the [place].  REVIEW OF SYSTEMS:  Negative in detail, except as outlined above.  PHYSICAL EXAMINATION:  GENERAL:  The patient is a well-developed, married black male.  VITAL SIGNS:  Temperature 98 degrees, pulse 82, respirations 12, blood pressure 120/70.  The patient is 5 feet 5 inches tall, weighing 162 pounds.  HEENT/NECK:  No thyromegaly.  No carotid bruits.  No adenopathy noted.  HEENT:  Benign oropharynx.  Pupils equal,  reactive.  CHEST:  Clear to auscultation.  Normal chest excursions.  No abnormal pulmonary sounds.  HEART:  A regular rate and rhythm.  No murmurs, rubs, or gallops.  ABDOMEN:  Soft, flat, scaphoid, with no masses or visceromegaly.  EXTREMITIES:  A full range of motion.  No cyanosis, clubbing, or edema.  IMPRESSION:  Postoperative admission following a sigmoid colectomy, at which point the briefly-noted polyp of the sigmoid colon was not identified.  PLAN:  The patient is now admitted for postoperative care. DD:  04/28/00 TD:  04/28/00 Job: [id number]

## 2010-08-10 NOTE — Discharge Summary (Signed)
[place]. [facility]  Patient:    [name], [name]                   MRN: [id number] Adm. Date:  [id number] Disc. Date: 05/05/00 Attending:  Sonda Primes                           Discharge Summary  ADMISSION DIAGNOSES:  Adenomatous polyp of the sigmoid colon at approximately 30-35 cm.  POSTOPERATIVE DIAGNOSIS:  Small adenomatous polyp of the sigmoid colon.  The polyp seen on colonoscopy and the barium enema was not located.  PROCEDURES IN HOSPITAL:  Sigmoid colectomy with colorectal anastomosis.  COMPLICATIONS:  Prolonged postoperative ileus.  CONDITION ON DISCHARGE:  Improved.  HOSPITAL COURSE:  The patient is a 65 year old man, who, on routine colonoscopy was noted to have a large sessile, what appeared to be a sessile polyp of the sigmoid colon located at approximately 30 cm.  He also had undergone previous air contrast barium enema, which he showed a lesion in this area.  The patient was admitted and brought to the operating room on the day of admission for exploration and polypectomy.  However, at the time of surgery, the polyp could not be found surgically or from endoscopic evaluation intraoperatively.  During the course of the operation, the gastroenterologist was also brought into the room and he colonoscoped the patient from the rectum to the ileocecal valve without any additional polyps being seen.  A sigmoid colectomy was carried out prophylactically and upon opening the bowel, only a very small polyp was noted.  The patient was reanastomosed and closed and his postoperative course has been benign, except for somewhat of a prolonged ileus, which I think was occasioned by the very extensive exploration that he went under during surgery.  His bowel activity is now normal.  His wounds are healing well and he is tolerating a regular diet.  He is being discharged to be followed in the office in two weeks.  DISCHARGE MEDICATIONS:   Vicodin 1-2 q.4h. p.r.n. pain.  ACTIVITIES:  As tolerated.   Patient is asked not to lift any weight greater than 20 pounds for the next four weeks.  DIET:  Not restricted. DD:  05/05/00 TD:  05/05/00 Job: [id number]

## 2010-08-10 NOTE — Op Note (Signed)
. [facility]  Patient:    [name], [name]                   MRN: [id number] Proc. Date: 04/28/00 Adm. Date:  [id number] Attending:  Sonda Primes CC:         Sharyn Dross., M.D.  Jarome Matin, M.D.   Operative Report  PREOPERATIVE DIAGNOSIS:  Sessile polyp visualized at 30 to 35 cm from the anal verge.  POSTOPERATIVE DIAGNOSIS:  No polypoid lesions found.  OPERATION PERFORMED:  Sigmoid colectomy.  Intraoperative rigid colonoscopy from rectum to splenic flexure.  Intraoperative colonoscopy to cecum and ileocecal valve.  SURGEON:  Mardene Celeste. Lurene Shadow, M.D.  ASSISTANT:  Marnee Spring. Wiliam Ke, M.D.  INTRAOPERATIVE CONSULTATION:  Sharyn Dross., M.D.  ANESTHESIA:  INDICATIONS FOR PROCEDURE:  The patient is a 65 year old who on routine barium enema was noted to have a sessile lesion of the colon located in sigmoid at approximately 30 to 35 cm away from the anal verge.  He was subsequently evaluated by colonoscopy and on colonoscopy the lesion was noted and biopsied and showed no evidence of malignancy; however, because of its sessile nature, the patient was referred for open polypectomy.  At the time of colonoscopy, lesion was also noted to be at approximately 30 to 35 cm from the anal verge. The patient was brought to the operating room now for open polypectomy.  DESCRIPTION OF PROCEDURE:  Following the induction of satisfactory anesthesia with the patient positioned supinely, the abdomen was prepped and draped to be included in a sterile operative field.  Exploratory laparotomy was carried out through a midline incision extending from the pubic symphysis up to and above the umbilicus.  On entering the abdomen, the rather redundant sigmoid colon was mobilized and the colon was palpated from the rectum all the way up to the splenic flexure, around the splenic flexure and across the transverse colon down to the cecu.  There were no  polyps identified.  A colotomy was made at the midsigmoid colon and a rigid sigmoidoscope was inserted into the colon and advanced down into the rectum and ____________ advanced proximally to the splenic flexure.  Again, no area of polypoid formation was noted.  The colotomy was closed.  At that point, we elected to do a sigmoid colectomy seeing the polyp on x-ray being located well within the midsigmoid.  The bowel was transected with a GIA stapler at the proximal sigmoid colon and again at the distal sigmoid colon.  The intervening mesentery was taken between clamps and secured with ties of 2-0 silk.  The specimen was then removed and opened up on the table.  We did not see any polypoid lesions within the removed colon corresponding to the lesions seen on colonoscopy or sigmoidoscopy.  There was one small polyp which was marked with a suture for further evaluation.  A functional end-to-end anastomosis was then carried out with a GIA stapler and a TA-55 stapling devices leaving a widely patent anastomosis.  The intervening mesenteric defect was then closed with interrupted 3-0 silk sutures.  At that point, Dr. Tad Moore entered the room and a colonoscopic set up was used and Dr. Tad Moore advanced the colonoscope from the anus all the way around to the ileocecal valve without encountering the previously noted polyp.  The colostomy then being complete, the sponge, instrument and sharp counts were verified.  The wound was closed in layers as follows.  The midline closed with a  running #1 Novofil suture.  Subcutaneous tissues irrigated and the skin closed wtih staples.  Sterile dressings applied.  Anesthetic reversed. Patient removed from the operating room to the recovery room in stable condition having tolerated the procedure well. DD:  04/28/00 TD:  04/28/00 Job: [id number]

## 2011-01-01 LAB — URINE MICROSCOPIC-ADD ON

## 2011-01-01 LAB — BASIC METABOLIC PANEL
Calcium: 8.5
Creatinine, Ser: 0.92
GFR calc Af Amer: >60
Sodium: 139

## 2011-01-01 LAB — I-STAT 8, (EC8 V) (CONVERTED LAB)
Acid-base deficit: 1
Bicarbonate: 24.4 — ABNORMAL HIGH
HCT: 49
Hemoglobin: 16.7
Operator id: 294511
Potassium: 3.5
Sodium: 137
pH, Ven: 7.359 — ABNORMAL HIGH

## 2011-01-01 LAB — COMPREHENSIVE METABOLIC PANEL
ALT: 39
ALT: 44
AST: 55 — ABNORMAL HIGH
CO2: 25
Calcium: 8.9
Chloride: 103
Creatinine, Ser: 0.8
GFR calc Af Amer: >60
GFR calc non Af Amer: >60
Glucose, Bld: 134 — ABNORMAL HIGH
Glucose, Bld: 176 — ABNORMAL HIGH
Sodium: 139
Total Bilirubin: 1
Total Protein: 6

## 2011-01-01 LAB — CBC
HCT: 45.3
Hemoglobin: 14
Hemoglobin: 14.9
Hemoglobin: 15.5
MCHC: 34.3
MCHC: 34.3
MCHC: 34.5
MCV: 90.7
MCV: 91.1
Platelets: 159
RBC: 4.32
RBC: 4.8
RBC: 4.97
RDW: 13.4
RDW: 13.4
WBC: 4.5
WBC: 5
WBC: 5.1

## 2011-01-01 LAB — DIFFERENTIAL
Basophils Absolute: 0
Basophils Relative: 0
Eosinophils Absolute: 0
Eosinophils Absolute: 0 — ABNORMAL LOW
Eosinophils Relative: 0
Lymphocytes Relative: 47 — ABNORMAL HIGH
Lymphs Abs: 2.3
Lymphs Abs: 2.4
Monocytes Absolute: 0.4
Monocytes Relative: 10
Monocytes Relative: 8
Neutro Abs: 2.3
Neutro Abs: 2.3
Neutrophils Relative %: 44
Neutrophils Relative %: 45

## 2011-01-01 LAB — URINALYSIS, ROUTINE W REFLEX MICROSCOPIC
Bilirubin Urine: NEGATIVE
Glucose, UA: NEGATIVE
Glucose, UA: 100 — AB
Glucose, UA: NEGATIVE
Hgb urine dipstick: NEGATIVE
Ketones, ur: NEGATIVE
Leukocytes, UA: NEGATIVE
Nitrite: NEGATIVE
Nitrite: NEGATIVE
Protein, ur: NEGATIVE
Protein, ur: 100 — AB
Specific Gravity, Urine: 1.021
Specific Gravity, Urine: 1.016
Specific Gravity, Urine: 1.025
Urobilinogen, UA: 1
pH: 5.5
pH: 6
pH: 7

## 2011-01-01 LAB — URINE CULTURE: Colony Count: 9000

## 2011-01-01 LAB — HOMOCYSTEINE: Homocysteine: 7.3

## 2011-01-01 LAB — LIPID PANEL
LDL Cholesterol: 100 — ABNORMAL HIGH
VLDL: 11

## 2011-01-01 LAB — PROTIME-INR
INR: 1
Prothrombin Time: 13.1

## 2011-01-01 LAB — HEMOGLOBIN A1C: Hgb A1c MFr Bld: 9.6 — ABNORMAL HIGH

## 2011-01-01 LAB — TSH: TSH: 1.626

## 2011-01-04 LAB — LIPID PANEL
Triglycerides: 58
VLDL: 12

## 2011-01-04 LAB — BASIC METABOLIC PANEL
Calcium: 8.5
Creatinine, Ser: 0.9
GFR calc Af Amer: >60
GFR calc non Af Amer: >60
Glucose, Bld: 101 — ABNORMAL HIGH
Sodium: 139

## 2011-01-04 LAB — I-STAT 8, (EC8 V) (CONVERTED LAB)
BUN: 13
Chloride: 100
pCO2, Ven: 45.2
pH, Ven: 7.382 — ABNORMAL HIGH

## 2011-01-04 LAB — BODY FLUID CELL COUNT WITH DIFFERENTIAL
Neutrophil Count, Fluid: 58 — ABNORMAL HIGH
Total Nucleated Cell Count, Fluid: 1275 — ABNORMAL HIGH

## 2011-01-04 LAB — BODY FLUID CULTURE: Culture: NO GROWTH

## 2011-01-04 LAB — CBC
HCT: 40.3
HCT: 43.3
Hemoglobin: 12.8 — ABNORMAL LOW
Hemoglobin: 14.2
Hemoglobin: 14.9
MCHC: 34.4
MCHC: 34.5
MCHC: 35.2
MCV: 92.4
RBC: 4.41
RBC: 4.68
RDW: 11.5
RDW: 12

## 2011-01-04 LAB — COMPREHENSIVE METABOLIC PANEL
Alkaline Phosphatase: 100
BUN: 12
CO2: 26
GFR calc non Af Amer: >60
Glucose, Bld: 175 — ABNORMAL HIGH
Potassium: 4
Total Bilirubin: 1.1
Total Protein: 6.3

## 2011-01-04 LAB — CULTURE, BLOOD (ROUTINE X 2): Culture: NO GROWTH

## 2011-01-04 LAB — DIFFERENTIAL
Basophils Relative: 0
Eosinophils Absolute: 0
Eosinophils Relative: 0
Monocytes Absolute: 0.3
Monocytes Relative: 3

## 2011-01-04 LAB — HEMOGLOBIN A1C
Hgb A1c MFr Bld: 12.1 — ABNORMAL HIGH
Mean Plasma Glucose: 353

## 2011-01-04 LAB — ALBUMIN, FLUID (OTHER)

## 2013-03-24 ENCOUNTER — Ambulatory Visit: Payer: Medicare Other | Attending: Internal Medicine | Admitting: Physical Therapy

## 2013-03-24 DIAGNOSIS — R269 Unspecified abnormalities of gait and mobility: Secondary | ICD-10-CM | POA: Insufficient documentation

## 2013-03-24 DIAGNOSIS — IMO0001 Reserved for inherently not codable concepts without codable children: Secondary | ICD-10-CM | POA: Insufficient documentation

## 2013-03-24 DIAGNOSIS — Z8673 Personal history of transient ischemic attack (TIA), and cerebral infarction without residual deficits: Secondary | ICD-10-CM | POA: Insufficient documentation

## 2013-03-25 HISTORY — PX: COLONOSCOPY: SHX174

## 2013-03-31 ENCOUNTER — Ambulatory Visit: Payer: Medicare Other

## 2013-03-31 ENCOUNTER — Ambulatory Visit: Payer: Medicare Other | Attending: Internal Medicine | Admitting: Physical Therapy

## 2013-03-31 DIAGNOSIS — IMO0001 Reserved for inherently not codable concepts without codable children: Secondary | ICD-10-CM | POA: Insufficient documentation

## 2013-03-31 DIAGNOSIS — Z8673 Personal history of transient ischemic attack (TIA), and cerebral infarction without residual deficits: Secondary | ICD-10-CM | POA: Insufficient documentation

## 2013-03-31 DIAGNOSIS — R269 Unspecified abnormalities of gait and mobility: Secondary | ICD-10-CM | POA: Insufficient documentation

## 2013-04-08 ENCOUNTER — Ambulatory Visit: Payer: Medicare Other | Admitting: Physical Therapy

## 2013-04-13 ENCOUNTER — Ambulatory Visit: Payer: Medicare Other | Admitting: Physical Therapy

## 2013-04-21 ENCOUNTER — Ambulatory Visit: Payer: Medicare Other | Admitting: Physical Therapy

## 2013-08-17 ENCOUNTER — Other Ambulatory Visit: Payer: Self-pay | Admitting: Ophthalmology

## 2013-08-17 ENCOUNTER — Encounter (HOSPITAL_COMMUNITY): Payer: Self-pay | Admitting: *Deleted

## 2013-08-17 MED ORDER — TETRACAINE HCL 0.5 % OP SOLN: 1.0000 [drp] | OPHTHALMIC | Status: DC

## 2013-08-17 MED ORDER — MITOMYCIN 0.2 MG OP KIT: 0.2000 mg | PACK | Freq: Once | OPHTHALMIC | Status: DC

## 2013-08-17 NOTE — Progress Notes (Signed)
08/17/13 1508  OBSTRUCTIVE SLEEP APNEA  Have you ever been diagnosed with sleep apnea through a sleep study? No  Do you snore loudly (loud enough to be heard through closed doors)?  0  Do you often feel tired, fatigued, or sleepy during the daytime? 1  Has anyone observed you stop breathing during your sleep? 0  Do you have, or are you being treated for high blood pressure? 1  Age over 67 years old? 1  Gender: 1  Obstructive Sleep Apnea Score 4

## 2013-08-17 NOTE — H&P (Signed)
                  History & Physical:   DATE:   08-05-13  NAME:  [name], [name]      [id number]       HISTORY OF PRESENT ILLNESS: Referred by Dr. Hollander     Chief Eye Complaints Glaucoma Patient OS decrease  vision (feels as if OD is effecting )  HPI: EYES: Reports symptoms of  vision sometimes blacks out      LOCATION:   BOTH EYES        QUALITY/COURSE:   Reports condition is worsening.        INTENSITY/SEVERITY:    Reports measurement ( or degree) as severe .      DURATION:   Reports the general length of symptoms to be years.      ONSET/TIMING:   Reports occurrence as   CONTEXT/WHEN:   Reports usually associated with   MODIFIERS/TREATMENTS:  Improved by                 ACTIVE PROBLEMS: Primary open angle glaucoma   ICD#365.11  Onset: 04/26/2013 09:48  Initial Date:   Double  bjerrum with encroachment  good response to SLT but IOP > target 14-15 IOP remains too high despite MTMT & Laser for glaucoma and  patient  notes visual blackouts   Hypertension   ICD#401.9  Onset: 04/26/2013 09:50  Initial Date:   Diabetes - Type 2 - unspecified   ICD#250.00  Onset: 04/26/2013 09:50  Initial Date:    Posterior vitreous detachment   ICD#379.21  Onset: 04/26/2013 12:09  Initial Date:  SURGERIES: Pick List - Surgeries SLT 360 OS  06/17/2013 14:25  MEDICATIONS: Simbrinza: 0.2%-1% suspension SIG-  1 gtt ou bid   Once Daily Men's Health  Multi Vitamin  Aspirin 325 mg  Diclofenac Sodium: Strength-  SIG-  Dose-  Freq-    Metformin (Glucophage):   500 mg tablet  SIG-  1 each    2 times a day    Glipizide (Glucotrol):   5 mg tablet  SIG-  1 each    2 times a day    Quinapril Hydrochloride: Strength-  SIG-  Dose-  Freq-    Lumigan: 0.01% solution SIG-  1 gtt in each affected eye once a day (in the evening) for 30 days   Betagan (Levobunolol ) Opthalmic Solution:   0.5% solution  SIG-  1 drop(s)   once a day   Lumigan: 0.01% solution SIG-  1 gtt in each affected eye once a day (in  the evening) for 30 days   Betagan (Levobunolol ) Opthalmic Solution:   0.5% solution  SIG-  1 drop(s)   once a day    Simbrinza: 0.2%-1% suspension SIG-  1 gtt ou bid  REVIEW OF SYSTEMS: ROS:   GEN- Constitutional: HENT: GEN - Endocrine: Reports symptoms of diabetes.    LUNGS/Respiratory:  HEART/Cardiovascular: Reports symptoms of hypertension.    ABD/Gastrointestinal:   Musculoskeletal (BJE): NEURO/Neurological: PSYCH/Psychiatric:    Is the pt oriented to time, place, person? 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  ALLERGIES: Drug Allergies.  No Known.  PHYSICAL EXAMINATION: VS: BMI: 24.2.  BP: 136/65.  H: 65.00 in.  P: 90 /min.  W: 145lbs 0oz.     Va     OD:cc 20/NLP OS cc 20/40 PH 20/NI  EYEGLASSES: several yrs old  OD    -0.25 combined with +0.25 axis 5                                          OS +4.00 combined with +2.00 axis 111 ADD:   MR   OD OS ADD  VF: OD:   OS:  Motility:  orthophoria and full  PUPILS: +2 afferent pupillary defect OD, round and reactive each eye  EYELIDS & OCULAR ADNEXA normal  SLE: Conjunctiva:pinguecula each eye quiet  Cornea:  arcus each eye   Anterior Chamber:  deep and quiet each eye  Iris: Brown each eye  Lens: +2 nuclear sclerosis each eye  Vitreous: posterior vitreous detachment    CCT:  Ta   in mmHg    OD  40    OS 21 Time: 08/05/2013 15:37   Gonio: the inferior angle open to trabecular meshwork rest of the angle was opened to scleral spur, both eyes   Dilation:  Tropicamide and phenylephrine now with Istalol now  Fundus:  optic nerve   OD: Rim loss                                                   OS:thin temporal w/small nerve    Macula       OD: Dull light reflex ou                                                    OS: dull light reflex ou  Vessels:narrow  Periphery: drusen OU     Exam: GENERAL: Appearance: HEAD, EARS, NOSE AND THROAT: Ears-Nose (external) Inspection: Externally, nose and ears are normal in appearance and without scars, lesions, or nodules.     Carotids no bruits    Hearing assessment shows no problems with normal conversation.      LUNGS and RESPIRATORY: Lung auscultation elicits no wheezing, rhonci, rales or rubs and with equal breath sounds.    Respiratory effort described as breathing is unlabored and chest movement is symmetrical.    HEART (Cardiovascular): Heart auscultation discovers regular rate and rhythm; no murmur, gallop or rub. Normal heart sounds.    ABDOMEN (Gastrointestinal): Mass/Tenderness Exam: Neither are present.     MUSCULOSKELETAL (BJE): Inspection-Palpation: No major bone, joint, tendon, or muscle changes.      NEUROLOGICAL: Alert and oriented. No major deficits of coordination or sensation.      PSYCHIATRIC: Insight and judgment appear  both to be intact and appropriate.    Mood and affect are described as normal mood and full affect.    SKIN: Skin Inspection: No rashes or lesions  ADMITTING DIAGNOSIS: Primary open   angle glaucoma   ICD#365.11  Onset: 04/26/2013 09:48  Initial Date:   Double  bjerrum with encroachment  good response to SLT but IOP > target 14-15 IOP remains too high despite MTMT & Laser for glaucoma and  patient  notes visual blackouts   Hypertension   ICD#401.9  Onset: 04/26/2013 09:50  Initial Date:   Diabetes - Type 2 - unspecified   ICD#250.00  Onset: 04/26/2013 09:50  Initial Date:    Posterior vitreous detachment   ICD#379.21  Onset: 04/26/2013 12:09  Initial  Date:  SURGICAL TREATMENT PLAN: trabeculectomy  with tube with [facility]  asap OS   Risk and benefits of surgery have been reviewed with the patient and the patient agrees to proceed with the surgical procedure.      Actions:     Handouts: Hypertension, glaucoma , what is glaucoma?, glaucoma treatment, Hypertension, glaucoma , what is glaucoma?, glaucoma treatment.    ___________________________ [name], Jr. Starter - Inactive Problems:  Stroke (Right Side) High Cholesterol

## 2013-08-18 ENCOUNTER — Encounter (HOSPITAL_COMMUNITY): Payer: Self-pay | Admitting: Certified Registered Nurse Anesthetist

## 2013-08-18 ENCOUNTER — Encounter (HOSPITAL_COMMUNITY): Payer: Medicare Other | Admitting: Certified Registered Nurse Anesthetist

## 2013-08-18 ENCOUNTER — Ambulatory Visit (HOSPITAL_COMMUNITY)
Admission: RE | Admit: 2013-08-18 | Discharge: 2013-08-18 | Disposition: A | Discharge status: Home / Self Care | Payer: Medicare Other | Source: Ambulatory Visit | Attending: Ophthalmology | Admitting: Ophthalmology

## 2013-08-18 ENCOUNTER — Ambulatory Visit (HOSPITAL_COMMUNITY): Payer: Medicare Other | Admitting: Certified Registered Nurse Anesthetist

## 2013-08-18 ENCOUNTER — Ambulatory Visit (HOSPITAL_COMMUNITY): Payer: Medicare Other

## 2013-08-18 ENCOUNTER — Encounter (HOSPITAL_COMMUNITY)
Admission: RE | Disposition: A | Discharge status: Home / Self Care | Payer: Self-pay | Source: Ambulatory Visit | Attending: Ophthalmology

## 2013-08-18 DIAGNOSIS — H43819 Vitreous degeneration, unspecified eye: Secondary | ICD-10-CM | POA: Insufficient documentation

## 2013-08-18 DIAGNOSIS — H4011X0 Primary open-angle glaucoma, stage unspecified: Secondary | ICD-10-CM | POA: Insufficient documentation

## 2013-08-18 DIAGNOSIS — R29898 Other symptoms and signs involving the musculoskeletal system: Secondary | ICD-10-CM | POA: Insufficient documentation

## 2013-08-18 DIAGNOSIS — Z79899 Other long term (current) drug therapy: Secondary | ICD-10-CM | POA: Insufficient documentation

## 2013-08-18 DIAGNOSIS — I69998 Other sequelae following unspecified cerebrovascular disease: Secondary | ICD-10-CM | POA: Insufficient documentation

## 2013-08-18 DIAGNOSIS — Z7982 Long term (current) use of aspirin: Secondary | ICD-10-CM | POA: Insufficient documentation

## 2013-08-18 DIAGNOSIS — E119 Type 2 diabetes mellitus without complications: Secondary | ICD-10-CM | POA: Insufficient documentation

## 2013-08-18 DIAGNOSIS — I10 Essential (primary) hypertension: Secondary | ICD-10-CM | POA: Insufficient documentation

## 2013-08-18 DIAGNOSIS — H2510 Age-related nuclear cataract, unspecified eye: Secondary | ICD-10-CM | POA: Insufficient documentation

## 2013-08-18 HISTORY — DX: Unspecified glaucoma: H40.9

## 2013-08-18 HISTORY — DX: Pure hypercholesterolemia, unspecified: E78.00

## 2013-08-18 HISTORY — PX: TRABECULECTOMY: SHX107

## 2013-08-18 HISTORY — DX: Cerebral infarction, unspecified: I63.9

## 2013-08-18 HISTORY — DX: Essential (primary) hypertension: I10

## 2013-08-18 LAB — CBC
HCT: 33.5 % — ABNORMAL LOW (ref 39.0–52.0)
Hemoglobin: 11.7 g/dL — ABNORMAL LOW (ref 13.0–17.0)
MCH: 32.8 pg (ref 26.0–34.0)
MCHC: 34.9 g/dL (ref 30.0–36.0)
MCV: 93.8 fL (ref 78.0–100.0)
PLATELETS: 155 10*3/uL (ref 150–400)
RBC: 3.57 MIL/uL — ABNORMAL LOW (ref 4.22–5.81)
RDW: 12.5 % (ref 11.5–15.5)
WBC: 4.8 10*3/uL (ref 4.0–10.5)

## 2013-08-18 LAB — BASIC METABOLIC PANEL
BUN: 26 mg/dL — ABNORMAL HIGH (ref 6–23)
CALCIUM: 8.7 mg/dL (ref 8.4–10.5)
CO2: 24 meq/L (ref 19–32)
CREATININE: 1.49 mg/dL — AB (ref 0.50–1.35)
Chloride: 106 mEq/L (ref 96–112)
GFR calc Af Amer: 54 mL/min — ABNORMAL LOW (ref >90)
GFR calc non Af Amer: 47 mL/min — ABNORMAL LOW (ref >90)
GLUCOSE: 266 mg/dL — AB (ref 70–99)
Potassium: 4.4 mEq/L (ref 3.7–5.3)
Sodium: 142 mEq/L (ref 137–147)

## 2013-08-18 LAB — GLUCOSE, CAPILLARY
GLUCOSE-CAPILLARY: 158 mg/dL — AB (ref 70–99)
Glucose-Capillary: 248 mg/dL — ABNORMAL HIGH (ref 70–99)

## 2013-08-18 SURGERY — TRABECULECTOMY
Anesthesia: Monitor Anesthesia Care | Site: Eye | Laterality: Left

## 2013-08-18 MED ORDER — ACETYLCHOLINE CHLORIDE 1:100 IO SOLR
INTRAOCULAR | Status: AC
  Filled 2013-08-18: qty 1

## 2013-08-18 MED ORDER — FENTANYL CITRATE 0.05 MG/ML IJ SOLN
INTRAMUSCULAR | Status: DC | PRN
  Administered 2013-08-18: 25 ug via INTRAVENOUS

## 2013-08-18 MED ORDER — BSS IO SOLN
INTRAOCULAR | Status: DC | PRN
  Administered 2013-08-18: 500 mL

## 2013-08-18 MED ORDER — TOBRAMYCIN 0.3 % OP OINT
TOPICAL_OINTMENT | OPHTHALMIC | Status: DC | PRN
  Administered 2013-08-18: 1 via OPHTHALMIC

## 2013-08-18 MED ORDER — SODIUM HYALURONATE 10 MG/ML IO SOLN
INTRAOCULAR | Status: AC
  Filled 2013-08-18: qty 0.85

## 2013-08-18 MED ORDER — PROPOFOL 10 MG/ML IV BOLUS
INTRAVENOUS | Status: DC | PRN
  Administered 2013-08-18: 20 mg via INTRAVENOUS

## 2013-08-18 MED ORDER — FLUORESCEIN SODIUM 1 MG OP STRP
ORAL_STRIP | OPHTHALMIC | Status: AC
  Filled 2013-08-18: qty 1

## 2013-08-18 MED ORDER — EPINEPHRINE HCL 1 MG/ML IJ SOLN
INTRAMUSCULAR | Status: AC
  Filled 2013-08-18: qty 1

## 2013-08-18 MED ORDER — HYALURONIDASE HUMAN 150 UNIT/ML IJ SOLN
150.0000 [IU] | Freq: Once | INTRAMUSCULAR | Status: DC
  Filled 2013-08-18: qty 1

## 2013-08-18 MED ORDER — HYALURONIDASE HUMAN 150 UNIT/ML IJ SOLN
INTRAMUSCULAR | Status: AC
  Filled 2013-08-18: qty 1

## 2013-08-18 MED ORDER — PREDNISOLONE ACETATE 1 % OP SUSP
1.0000 [drp] | OPHTHALMIC | Status: AC
  Administered 2013-08-18 (×2): 1 [drp] via OPHTHALMIC

## 2013-08-18 MED ORDER — FLUORESCEIN SODIUM 1 MG OP STRP
ORAL_STRIP | OPHTHALMIC | Status: DC | PRN
  Administered 2013-08-18: 1 via OPHTHALMIC

## 2013-08-18 MED ORDER — BSS IO SOLN
INTRAOCULAR | Status: AC
  Filled 2013-08-18: qty 500

## 2013-08-18 MED ORDER — PILOCARPINE HCL 4 % OP SOLN
OPHTHALMIC | Status: AC
  Filled 2013-08-18: qty 15

## 2013-08-18 MED ORDER — SODIUM CHLORIDE 0.9 % IV SOLN
INTRAVENOUS | Status: DC | PRN
  Administered 2013-08-18: 10:00:00 via INTRAVENOUS

## 2013-08-18 MED ORDER — BSS IO SOLN
INTRAOCULAR | Status: AC
  Filled 2013-08-18: qty 15

## 2013-08-18 MED ORDER — GATIFLOXACIN 0.5 % OP SOLN
1.0000 [drp] | OPHTHALMIC | Status: AC | PRN
  Administered 2013-08-18 (×3): 1 [drp] via OPHTHALMIC

## 2013-08-18 MED ORDER — LIDOCAINE HCL 2 % IJ SOLN
INTRAMUSCULAR | Status: DC | PRN
  Administered 2013-08-18: 10 mL

## 2013-08-18 MED ORDER — ONDANSETRON HCL 4 MG/2ML IJ SOLN
INTRAMUSCULAR | Status: DC | PRN
  Administered 2013-08-18: 4 mg via INTRAVENOUS

## 2013-08-18 MED ORDER — LIDOCAINE HCL 2 % IJ SOLN
INTRAMUSCULAR | Status: AC
  Filled 2013-08-18: qty 20

## 2013-08-18 MED ORDER — ATROPINE SULFATE 1 % OP SOLN
OPHTHALMIC | Status: AC
  Filled 2013-08-18: qty 2

## 2013-08-18 MED ORDER — BUPIVACAINE HCL (PF) 0.75 % IJ SOLN
INTRAMUSCULAR | Status: AC
  Filled 2013-08-18: qty 10

## 2013-08-18 MED ORDER — PREDNISOLONE ACETATE 1 % OP SUSP
OPHTHALMIC | Status: AC
  Administered 2013-08-18: 10:00:00
  Filled 2013-08-18: qty 5

## 2013-08-18 MED ORDER — MIDAZOLAM HCL 5 MG/5ML IJ SOLN
INTRAMUSCULAR | Status: DC | PRN
  Administered 2013-08-18: 2 mg via INTRAVENOUS

## 2013-08-18 MED ORDER — 0.9 % SODIUM CHLORIDE (POUR BTL) OPTIME
TOPICAL | Status: DC | PRN
  Administered 2013-08-18: 1000 mL

## 2013-08-18 MED ORDER — TRIAMCINOLONE ACETONIDE 40 MG/ML IJ SUSP
INTRAMUSCULAR | Status: DC | PRN
  Administered 2013-08-18: 40 mg

## 2013-08-18 MED ORDER — GATIFLOXACIN 0.5 % OP SOLN
OPHTHALMIC | Status: AC
  Administered 2013-08-18: 1 [drp] via OPHTHALMIC
  Filled 2013-08-18: qty 2.5

## 2013-08-18 MED ORDER — MITOMYCIN 0.2 MG OP KIT
PACK | OPHTHALMIC | Status: DC | PRN
  Administered 2013-08-18: 0.2 mg via OPHTHALMIC

## 2013-08-18 MED ORDER — MITOMYCIN 0.2 MG OP KIT
0.2000 mg | PACK | Freq: Once | OPHTHALMIC | Status: DC
  Filled 2013-08-18: qty 1

## 2013-08-18 MED ORDER — TOBRAMYCIN-DEXAMETHASONE 0.3-0.1 % OP OINT
TOPICAL_OINTMENT | OPHTHALMIC | Status: AC
  Filled 2013-08-18: qty 3.5

## 2013-08-18 MED ORDER — SODIUM HYALURONATE 10 MG/ML IO SOLN
INTRAOCULAR | Status: DC | PRN
  Administered 2013-08-18: 0.85 mL via INTRAOCULAR

## 2013-08-18 MED ORDER — LABETALOL HCL 5 MG/ML IV SOLN
INTRAVENOUS | Status: DC | PRN
  Administered 2013-08-18 (×2): 5 mg via INTRAVENOUS
  Administered 2013-08-18: 10 mg via INTRAVENOUS

## 2013-08-18 MED ORDER — TETRACAINE HCL 0.5 % OP SOLN
OPHTHALMIC | Status: DC | PRN
  Administered 2013-08-18: 1 [drp] via OPHTHALMIC

## 2013-08-18 MED ORDER — TRIAMCINOLONE ACETONIDE 40 MG/ML IJ SUSP
INTRAMUSCULAR | Status: AC
  Filled 2013-08-18: qty 5

## 2013-08-18 MED ORDER — TETRACAINE HCL 0.5 % OP SOLN
OPHTHALMIC | Status: AC
  Filled 2013-08-18: qty 2

## 2013-08-18 MED ORDER — BUPIVACAINE HCL (PF) 0.75 % IJ SOLN
INTRAMUSCULAR | Status: DC | PRN
  Administered 2013-08-18: 10 mL

## 2013-08-18 MED ORDER — LIDOCAINE-EPINEPHRINE 2 %-1:100000 IJ SOLN
INTRAMUSCULAR | Status: AC
  Filled 2013-08-18: qty 1

## 2013-08-18 SURGICAL SUPPLY — 46 items
APL SRG 3 HI ABS STRL LF PLS (MISCELLANEOUS) ×1
APPLICATOR COTTON TIP 6IN STRL (MISCELLANEOUS) ×3 IMPLANT
APPLICATOR DR MATTHEWS STRL (MISCELLANEOUS) ×3 IMPLANT
BLADE 10 SAFETY STRL DISP (BLADE) ×1 IMPLANT
BLADE EYE CATARACT 19 1.4 BEAV (BLADE) ×2 IMPLANT
BLADE MINI RND TIP GREEN BEAV (BLADE) IMPLANT
BLADE STAB KNIFE 45DEG (BLADE) ×3 IMPLANT
CANISTER SUCTION 2500CC (MISCELLANEOUS) ×2 IMPLANT
CORDS BIPOLAR (ELECTRODE) ×3 IMPLANT
COVER MAYO STAND STRL (DRAPES) ×3 IMPLANT
DRAPE OPHTHALMIC 40X48 W POUCH (DRAPES) ×3 IMPLANT
DRAPE RETRACTOR (MISCELLANEOUS) ×3 IMPLANT
ERASER HMR WETFIELD 23G BP (MISCELLANEOUS) IMPLANT
GLOVE BIO SURGEON STRL SZ8 (GLOVE) ×3 IMPLANT
GLOVE SURG SS PI 6.0 STRL IVOR (GLOVE) ×2 IMPLANT
GOWN STRL REIN XL XLG (GOWN DISPOSABLE) ×4 IMPLANT
GOWN STRL REUS W/ TWL LRG LVL3 (GOWN DISPOSABLE) ×2 IMPLANT
GOWN STRL REUS W/ TWL XL LVL3 (GOWN DISPOSABLE) ×1 IMPLANT
GOWN STRL REUS W/TWL LRG LVL3 (GOWN DISPOSABLE)
GOWN STRL REUS W/TWL XL LVL3 (GOWN DISPOSABLE)
KIT BASIN OR (CUSTOM PROCEDURE TRAY) ×3 IMPLANT
KIT ROOM TURNOVER OR (KITS) ×3 IMPLANT
KNIFE GRIESHABER SHARP 2.5MM (MISCELLANEOUS) ×3 IMPLANT
MASK EYE SHIELD (GAUZE/BANDAGES/DRESSINGS) ×2 IMPLANT
NDL 25GX 5/8IN NON SAFETY (NEEDLE) ×1 IMPLANT
NDL HYPO 30X.5 LL (NEEDLE) ×1 IMPLANT
NEEDLE 25GX 5/8IN NON SAFETY (NEEDLE) ×3 IMPLANT
NEEDLE HYPO 30X.5 LL (NEEDLE) ×3 IMPLANT
NS IRRIG 1000ML POUR BTL (IV SOLUTION) ×3 IMPLANT
PACK CATARACT CUSTOM (CUSTOM PROCEDURE TRAY) ×3 IMPLANT
PAD ARMBOARD 7.5X6 YLW CONV (MISCELLANEOUS) ×4 IMPLANT
SHUNT EXPRS GLAUCOMA MINI P200 (Intraocular Lens) ×2 IMPLANT
SPEAR EYE SURG WECK-CEL (MISCELLANEOUS) IMPLANT
SPECIMEN JAR SMALL (MISCELLANEOUS) IMPLANT
SUT ETHILON 10 0 CS140 6 (SUTURE) ×3 IMPLANT
SUT ETHILON 9 0 BV100 4 (SUTURE) ×3 IMPLANT
SUT SILK 6 0 G 6 (SUTURE) ×3 IMPLANT
SUT VICRYL 9-0 (SUTURE) IMPLANT
SYR 50ML SLIP (SYRINGE) ×3 IMPLANT
SYR TB 1ML LUER SLIP (SYRINGE) IMPLANT
TAPE SURG TRANSPORE 1 IN (GAUZE/BANDAGES/DRESSINGS) ×1 IMPLANT
TAPE SURGICAL TRANSPORE 1 IN (GAUZE/BANDAGES/DRESSINGS) ×2
TOWEL OR 17X24 6PK STRL BLUE (TOWEL DISPOSABLE) ×6 IMPLANT
TUBE CONNECTING 12'X1/4 (SUCTIONS) ×1
TUBE CONNECTING 12X1/4 (SUCTIONS) ×1 IMPLANT
WIPE INSTRUMENT VISIWIPE 73X73 (MISCELLANEOUS) ×4 IMPLANT

## 2013-08-18 NOTE — Anesthesia Postprocedure Evaluation (Signed)
  Anesthesia Post-op Note  Patient: [name]  Procedure(s) Performed: Procedure(s) (LRB): TRABECULECTOMY WITH TUBE WITH [place] (Left)  Patient Location: PACU  Anesthesia Type: MAC  Level of Consciousness: awake and alert   Airway and Oxygen Therapy: Patient Spontanous Breathing  Post-op Pain: mild  Post-op Assessment: Post-op Vital signs reviewed, Patient's Cardiovascular Status Stable, Respiratory Function Stable, Patent Airway and No signs of Nausea or vomiting  Last Vitals:  Filed Vitals:   08/18/13 1450  BP: 177/91  Pulse: 63  Temp: 36.3 C  Resp: 18    Post-op Vital Signs: stable   Complications: No apparent anesthesia complications

## 2013-08-18 NOTE — Anesthesia Preprocedure Evaluation (Addendum)
Anesthesia Evaluation  Patient identified by MRN, date of birth, ID band Patient awake    Reviewed: Allergy & Precautions, H&P , NPO status , Patient's Chart, lab work & pertinent test results  Airway Mallampati: II TM Distance: >3 FB Neck ROM: Full    Dental no notable dental hx. (+) Missing   Pulmonary neg pulmonary ROS,  breath sounds clear to auscultation  Pulmonary exam normal       Cardiovascular hypertension, Pt. on medications negative cardio ROS  Rhythm:Regular Rate:Normal     Neuro/Psych CVA (R sided weakness), Residual Symptoms negative neurological ROS  negative psych ROS   GI/Hepatic negative GI ROS, Neg liver ROS,   Endo/Other  negative endocrine ROSdiabetes, Type 2, Oral Hypoglycemic Agents  Renal/GU negative Renal ROS  negative genitourinary   Musculoskeletal negative musculoskeletal ROS (+)   Abdominal   Peds negative pediatric ROS (+)  Hematology negative hematology ROS (+)   Anesthesia Other Findings   Reproductive/Obstetrics negative OB ROS                          Anesthesia Physical Anesthesia Plan  ASA: III  Anesthesia Plan: MAC   Post-op Pain Management:    Induction:   Airway Management Planned: Nasal Cannula  Additional Equipment:   Intra-op Plan:   Post-operative Plan:   Informed Consent: I have reviewed the patients History and Physical, chart, labs and discussed the procedure including the risks, benefits and alternatives for the proposed anesthesia with the patient or authorized representative who has indicated his/her understanding and acceptance.   Dental advisory given  Plan Discussed with: CRNA  Anesthesia Plan Comments:         Anesthesia Quick Evaluation

## 2013-08-18 NOTE — Transfer of Care (Signed)
Immediate Anesthesia Transfer of Care Note  Patient: [name]  Procedure(s) Performed: Procedure(s): TRABECULECTOMY WITH TUBE WITH [place] (Left)  Patient Location: PACU  Anesthesia Type:MAC  Level of Consciousness: awake, alert  and oriented  Airway & Oxygen Therapy: Patient Spontanous Breathing and Patient connected to nasal cannula oxygen  Post-op Assessment: Report given to PACU RN, Post -op Vital signs reviewed and stable and Patient moving all extremities X 4  Post vital signs: Reviewed and stable  Complications: No apparent anesthesia complications

## 2013-08-18 NOTE — Interval H&P Note (Signed)
History and Physical Interval Note:  08/18/2013 10:45 AM  [name]  has presented today for surgery, with the diagnosis of Primary open-angle glaucoma  The various methods of treatment have been discussed with the patient and family. After consideration of risks, benefits and other options for treatment, the patient has consented to  Procedure(s): TRABECULECTOMY WITH TUBE WITH [facility] (Left) as a surgical intervention .  The patient's history has been reviewed, patient examined, no change in status, stable for surgery.  I have reviewed the patient's chart and labs.  Questions were answered to the patient's satisfaction.     [name]

## 2013-08-18 NOTE — Op Note (Signed)
Preoperative diagnosis: Uncontrolled glaucoma left eye . The patient's only seeing eye despite previous laser treatment and maximum medical therapy Postop diagnosis: Same Anesthesia: 2% Xylocaine in a 50-50 mixture 7.89% Marcaine Complications: None Procedure: The patient was transferred to the operating room where he was given a peribulbar block with the aforementioned local anesthetic this the patient's face was prepped and draped in the usual sterile fashion. With the surgeon sitting at the 12:00 position and the operating microscope in position a 6-0 nylon suture was passed through clear cornea to infraducted the eye following this using a Hoskins forceps and incision was made in the high superior fornix with a Wescott scissors then used to the bleb blunt Wescott scissors dissection was carried down to the corneal scleral limbus were the limbal tissues were separated using a Tooke blade the tissues were recessed and bleeding was controlled with cautery following this a scleral flap of with the base of 4 mm was fashioned using a 45 blade the scleral flap was then elevated with the Colibri forceps and dissection was carried out to the limbus. Following this mitomycin-C 0.4 mg per cc was placed on a Gelfoam sponge and allowed to stay under the sclera and under the conjunctiva for 3 minutes the sponges were then removed the eye was irrigated with 30 cc of balanced salt solution. Following this a paracentesis tract was formed at the 4:00 position and Provisc was injected in the anterior chamber a 26-gauge needle attached to Provisc was then passed under the scleral flap at the limbus and was noted to into the anterior chamber without touching cornea or iris it was withdrawn with lot injecting Provisc. Then the express glaucoma device was examined and noted to have no defects it was safe glaucoma filtration device versions P-200 SN #[id number]. Glaucoma device was then passed through the tract created by the  26-gauge needle and positioned the scleral flap was then sutured with 3 interrupted 10-0 nylon sutures the conjunctiva was then closed with a 9-0 nylon on a tapered needle the suture line was tightened the anterior chamber was then deepened using balanced salt solution and part of the Provisc was allowed to be removed from the anterior chamber through the paracentesis tract the bleb elevated and the incision line was Seidel negative. A subconjunctival injection of Kenalog 4 mg is given in the inferior nasal conjunctival space following this the sutures removed from the eye the lid speculum removed and topical TobraDex ointment was applied the patient had adequate eye movement at the end of the case and since it was his only seeing eye an eye she'll was placed no patch. Complications: None [name] Junior M.D.

## 2013-08-18 NOTE — Discharge Instructions (Signed)
The patient should keep the eye a shield over his eye or  where his eyeglasses after he returns home. When he goes to sleep tonight he should replace the eye shield over the eye. Do not sleep on the left side do not apply pressure to the eye, avoid heavy lifting bending or straining, do not lift anything over 25 pounds.

## 2013-08-18 NOTE — H&P (View-Only) (Signed)
History & Physical:   DATE:   08-05-13  NAME:  [name], [name]      [id number]       HISTORY OF PRESENT ILLNESS: Referred by Dr. Claudean Kinds     Chief Eye Complaints Glaucoma Patient OS decrease  vision (feels as if OD is effecting )  HPI: EYES: Reports symptoms of  vision sometimes blacks out      LOCATION:   BOTH EYES        QUALITY/COURSE:   Reports condition is worsening.        INTENSITY/SEVERITY:    Reports measurement ( or degree) as severe .      DURATION:   Reports the general length of symptoms to be years.      ONSET/TIMING:   Reports occurrence as   CONTEXT/WHEN:   Reports usually associated with   MODIFIERS/TREATMENTS:  Improved by                 ACTIVE PROBLEMS: Primary open angle glaucoma   ICD#365.11  Onset: 04/26/2013 09:48  Initial Date:   Double  bjerrum with encroachment  good response to SLT but IOP > target 14-15 IOP remains too high despite MTMT & Laser for glaucoma and  patient  notes visual blackouts   Hypertension   ICD#401.9  Onset: 04/26/2013 09:50  Initial Date:   Diabetes - Type 2 - unspecified   ICD#250.00  Onset: 04/26/2013 09:50  Initial Date:    Posterior vitreous detachment   ICD#379.21  Onset: 04/26/2013 12:09  Initial Date:  SURGERIES: Pick List - Surgeries SLT 360 OS  06/17/2013 14:25  MEDICATIONS: Simbrinza: 0.2%-1% suspension SIG-  1 gtt ou bid   Once Daily Men's Health  Multi Vitamin  Aspirin 325 mg  Diclofenac Sodium: Strength-  SIG-  Dose-  Freq-    Metformin (Glucophage):   500 mg tablet  SIG-  1 each    2 times a day    Glipizide (Glucotrol):   5 mg tablet  SIG-  1 each    2 times a day    Quinapril Hydrochloride: Strength-  SIG-  Dose-  Freq-    Lumigan: 0.01% solution SIG-  1 gtt in each affected eye once a day (in the evening) for 30 days   Betagan (Levobunolol ) Opthalmic Solution:   0.5% solution  SIG-  1 drop(s)   once a day   Lumigan: 0.01% solution SIG-  1 gtt in each affected eye once a day (in  the evening) for 30 days   Betagan (Levobunolol ) Opthalmic Solution:   0.5% solution  SIG-  1 drop(s)   once a day    Simbrinza: 0.2%-1% suspension SIG-  1 gtt ou bid  REVIEW OF SYSTEMS: ROS:   GEN- Constitutional: HENT: GEN - Endocrine: Reports symptoms of diabetes.    LUNGS/Respiratory:  HEART/Cardiovascular: Reports symptoms of hypertension.    ABD/Gastrointestinal:   Musculoskeletal (BJE): NEURO/Neurological: PSYCH/Psychiatric:    Is the pt oriented to time, place, person? 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  ALLERGIES: Drug Allergies.  No Known.  PHYSICAL EXAMINATION: VS: BMI: 24.2.  BP: 136/65.  H: 65.00 in.  P: 90 /min.  W: 145lbs 0oz.     Va     OD:cc 20/NLP OS cc 20/40 PH 20/NI  EYEGLASSES: several yrs old  OD    -0.25 combined with +0.25 axis 5                                          OS +4.00 combined with +2.00 axis 111 ADD:   MR   OD OS ADD  VF: OD:   OS:  Motility:  orthophoria and full  PUPILS: +2 afferent pupillary defect OD, round and reactive each eye  EYELIDS & OCULAR ADNEXA normal  SLE: Conjunctiva:pinguecula each eye quiet  Cornea:  arcus each eye   Anterior Chamber:  deep and quiet each eye  Iris: Brown each eye  Lens: +2 nuclear sclerosis each eye  Vitreous: posterior vitreous detachment    CCT:  Ta   in mmHg    OD  40    OS 21 Time: 08/05/2013 15:37   Gonio: the inferior angle open to trabecular meshwork rest of the angle was opened to scleral spur, both eyes   Dilation:  Tropicamide and phenylephrine now with Istalol now  Fundus:  optic nerve   OD: Rim loss                                                   WE:RXVQ temporal w/small nerve    Macula       OD: Dull light reflex ou                                                    OS: dull light reflex ou  Vessels:narrow  Periphery: drusen OU     Exam: GENERAL: Appearance: HEAD, EARS, NOSE AND THROAT: Ears-Nose (external) Inspection: Externally, nose and ears are normal in appearance and without scars, lesions, or nodules.     Carotids no bruits    Hearing assessment shows no problems with normal conversation.      LUNGS and RESPIRATORY: Lung auscultation elicits no wheezing, rhonci, rales or rubs and with equal breath sounds.    Respiratory effort described as breathing is unlabored and chest movement is symmetrical.    HEART (Cardiovascular): Heart auscultation discovers regular rate and rhythm; no murmur, gallop or rub. Normal heart sounds.    ABDOMEN (Gastrointestinal): Mass/Tenderness Exam: Neither are present.     MUSCULOSKELETAL (BJE): Inspection-Palpation: No major bone, joint, tendon, or muscle changes.      NEUROLOGICAL: Alert and oriented. No major deficits of coordination or sensation.      PSYCHIATRIC: Insight and judgment appear  both to be intact and appropriate.    Mood and affect are described as normal mood and full affect.    SKIN: Skin Inspection: No rashes or lesions  ADMITTING DIAGNOSIS: Primary open  angle glaucoma   ICD#365.11  Onset: 04/26/2013 09:48  Initial Date:   Double  bjerrum with encroachment  good response to SLT but IOP > target 14-15 IOP remains too high despite MTMT & Laser for glaucoma and  patient  notes visual blackouts   Hypertension   ICD#401.9  Onset: 04/26/2013 09:50  Initial Date:   Diabetes - Type 2 - unspecified   ICD#250.00  Onset: 04/26/2013 09:50  Initial Date:    Posterior vitreous detachment   ICD#379.21  Onset: 04/26/2013 12:09  Initial  Date:  SURGICAL TREATMENT PLAN: trabeculectomy  with tube with [facility]  asap OS   Risk and benefits of surgery have been reviewed with the patient and the patient agrees to proceed with the surgical procedure.      Actions:     Handouts: Hypertension, glaucoma , what is glaucoma?, glaucoma treatment, Hypertension, glaucoma , what is glaucoma?, glaucoma treatment.    ___________________________ [name], Jr. Starter - Inactive Problems:  Stroke (Right Side) High Cholesterol

## 2013-08-18 NOTE — Anesthesia Procedure Notes (Signed)
Procedure Name: MAC Date/Time: 08/18/2013 1:15 PM Performed by: Blair Heys E Pre-anesthesia Checklist: Patient identified, Emergency Drugs available, Suction available and Patient being monitored Patient Re-evaluated:Patient Re-evaluated prior to inductionOxygen Delivery Method: Nasal cannula Preoxygenation: Pre-oxygenation with 100% oxygen Placement Confirmation: positive ETCO2 and breath sounds checked- equal and bilateral Dental Injury: Teeth and Oropharynx as per pre-operative assessment

## 2013-08-19 ENCOUNTER — Encounter (HOSPITAL_COMMUNITY): Payer: Self-pay | Admitting: Ophthalmology

## 2013-11-10 ENCOUNTER — Other Ambulatory Visit (HOSPITAL_COMMUNITY): Payer: Self-pay | Admitting: Internal Medicine

## 2013-11-10 DIAGNOSIS — R609 Edema, unspecified: Secondary | ICD-10-CM

## 2013-11-11 ENCOUNTER — Encounter (HOSPITAL_COMMUNITY): Payer: Medicare Other

## 2013-11-22 ENCOUNTER — Ambulatory Visit (HOSPITAL_COMMUNITY): Payer: Medicare Other | Attending: Internal Medicine

## 2014-04-09 ENCOUNTER — Inpatient Hospital Stay (HOSPITAL_COMMUNITY): Payer: Commercial Managed Care - HMO

## 2014-04-09 ENCOUNTER — Encounter (HOSPITAL_COMMUNITY): Payer: Self-pay | Admitting: Emergency Medicine

## 2014-04-09 ENCOUNTER — Inpatient Hospital Stay (HOSPITAL_COMMUNITY)
Admission: EM | Admit: 2014-04-09 | Discharge: 2014-04-18 | DRG: 682 | Disposition: A | Discharge status: Skilled Nursing Facility | Payer: Commercial Managed Care - HMO | Attending: Internal Medicine | Admitting: Internal Medicine

## 2014-04-09 DIAGNOSIS — N179 Acute kidney failure, unspecified: Secondary | ICD-10-CM | POA: Diagnosis present

## 2014-04-09 DIAGNOSIS — B1920 Unspecified viral hepatitis C without hepatic coma: Secondary | ICD-10-CM | POA: Diagnosis present

## 2014-04-09 DIAGNOSIS — I639 Cerebral infarction, unspecified: Secondary | ICD-10-CM

## 2014-04-09 DIAGNOSIS — E1151 Type 2 diabetes mellitus with diabetic peripheral angiopathy without gangrene: Secondary | ICD-10-CM | POA: Insufficient documentation

## 2014-04-09 DIAGNOSIS — I420 Dilated cardiomyopathy: Secondary | ICD-10-CM | POA: Diagnosis present

## 2014-04-09 DIAGNOSIS — R748 Abnormal levels of other serum enzymes: Secondary | ICD-10-CM | POA: Diagnosis present

## 2014-04-09 DIAGNOSIS — E785 Hyperlipidemia, unspecified: Secondary | ICD-10-CM | POA: Diagnosis present

## 2014-04-09 DIAGNOSIS — D649 Anemia, unspecified: Secondary | ICD-10-CM | POA: Diagnosis present

## 2014-04-09 DIAGNOSIS — I10 Essential (primary) hypertension: Secondary | ICD-10-CM

## 2014-04-09 DIAGNOSIS — I6340 Cerebral infarction due to embolism of unspecified cerebral artery: Secondary | ICD-10-CM | POA: Diagnosis not present

## 2014-04-09 DIAGNOSIS — I69351 Hemiplegia and hemiparesis following cerebral infarction affecting right dominant side: Secondary | ICD-10-CM | POA: Diagnosis not present

## 2014-04-09 DIAGNOSIS — R7989 Other specified abnormal findings of blood chemistry: Secondary | ICD-10-CM

## 2014-04-09 DIAGNOSIS — E7800 Pure hypercholesterolemia, unspecified: Secondary | ICD-10-CM

## 2014-04-09 DIAGNOSIS — I129 Hypertensive chronic kidney disease with stage 1 through stage 4 chronic kidney disease, or unspecified chronic kidney disease: Secondary | ICD-10-CM | POA: Diagnosis present

## 2014-04-09 DIAGNOSIS — N183 Chronic kidney disease, stage 3 (moderate): Secondary | ICD-10-CM | POA: Diagnosis present

## 2014-04-09 DIAGNOSIS — I6990 Unspecified sequelae of unspecified cerebrovascular disease: Secondary | ICD-10-CM | POA: Diagnosis present

## 2014-04-09 DIAGNOSIS — E1121 Type 2 diabetes mellitus with diabetic nephropathy: Secondary | ICD-10-CM | POA: Diagnosis present

## 2014-04-09 DIAGNOSIS — R778 Other specified abnormalities of plasma proteins: Secondary | ICD-10-CM | POA: Diagnosis present

## 2014-04-09 DIAGNOSIS — R601 Generalized edema: Secondary | ICD-10-CM

## 2014-04-09 DIAGNOSIS — E119 Type 2 diabetes mellitus without complications: Secondary | ICD-10-CM | POA: Diagnosis present

## 2014-04-09 DIAGNOSIS — E1165 Type 2 diabetes mellitus with hyperglycemia: Secondary | ICD-10-CM | POA: Diagnosis present

## 2014-04-09 DIAGNOSIS — G8194 Hemiplegia, unspecified affecting left nondominant side: Secondary | ICD-10-CM | POA: Diagnosis not present

## 2014-04-09 DIAGNOSIS — N19 Unspecified kidney failure: Secondary | ICD-10-CM

## 2014-04-09 DIAGNOSIS — E43 Unspecified severe protein-calorie malnutrition: Secondary | ICD-10-CM | POA: Diagnosis present

## 2014-04-09 DIAGNOSIS — N400 Enlarged prostate without lower urinary tract symptoms: Secondary | ICD-10-CM | POA: Diagnosis present

## 2014-04-09 DIAGNOSIS — R531 Weakness: Secondary | ICD-10-CM

## 2014-04-09 HISTORY — DX: Cerebrovascular disease, unspecified: I67.9

## 2014-04-09 HISTORY — DX: Headache: R51

## 2014-04-09 HISTORY — DX: Headache, unspecified: R51.9

## 2014-04-09 HISTORY — DX: Chronic combined systolic (congestive) and diastolic (congestive) heart failure: I50.42

## 2014-04-09 LAB — TROPONIN I
Troponin I: 0.04 ng/mL — ABNORMAL HIGH (ref <0.031)
Troponin I: 0.08 ng/mL — ABNORMAL HIGH (ref <0.031)

## 2014-04-09 LAB — COMPREHENSIVE METABOLIC PANEL
ALT: 31 U/L (ref 0–53)
AST: 42 U/L — ABNORMAL HIGH (ref 0–37)
Albumin: 1.8 g/dL — ABNORMAL LOW (ref 3.5–5.2)
Alkaline Phosphatase: 102 U/L (ref 39–117)
Anion gap: 5 (ref 5–15)
BUN: 34 mg/dL — ABNORMAL HIGH (ref 6–23)
CO2: 26 mmol/L (ref 19–32)
Calcium: 8.3 mg/dL — ABNORMAL LOW (ref 8.4–10.5)
Chloride: 110 mEq/L (ref 96–112)
Creatinine, Ser: 2.4 mg/dL — ABNORMAL HIGH (ref 0.50–1.35)
GFR calc Af Amer: 31 mL/min — ABNORMAL LOW (ref >90)
GFR calc non Af Amer: 26 mL/min — ABNORMAL LOW (ref >90)
Glucose, Bld: 273 mg/dL — ABNORMAL HIGH (ref 70–99)
Potassium: 3.9 mmol/L (ref 3.5–5.1)
Sodium: 141 mmol/L (ref 135–145)
Total Bilirubin: 0.5 mg/dL (ref 0.3–1.2)
Total Protein: 5.5 g/dL — ABNORMAL LOW (ref 6.0–8.3)

## 2014-04-09 LAB — CBC WITH DIFFERENTIAL/PLATELET
Basophils Absolute: 0 10*3/uL (ref 0.0–0.1)
Basophils Relative: 0 % (ref 0–1)
Eosinophils Absolute: 0 10*3/uL (ref 0.0–0.7)
Eosinophils Relative: 0 % (ref 0–5)
HCT: 35 % — ABNORMAL LOW (ref 39.0–52.0)
Hemoglobin: 11.9 g/dL — ABNORMAL LOW (ref 13.0–17.0)
Lymphocytes Relative: 15 % (ref 12–46)
Lymphs Abs: 1.4 10*3/uL (ref 0.7–4.0)
MCH: 30.5 pg (ref 26.0–34.0)
MCHC: 34 g/dL (ref 30.0–36.0)
MCV: 89.7 fL (ref 78.0–100.0)
Monocytes Absolute: 0.6 10*3/uL (ref 0.1–1.0)
Monocytes Relative: 7 % (ref 3–12)
Neutro Abs: 7.6 10*3/uL (ref 1.7–7.7)
Neutrophils Relative %: 78 % — ABNORMAL HIGH (ref 43–77)
Platelets: 261 10*3/uL (ref 150–400)
RBC: 3.9 MIL/uL — ABNORMAL LOW (ref 4.22–5.81)
RDW: 13.4 % (ref 11.5–15.5)
WBC: 9.7 10*3/uL (ref 4.0–10.5)

## 2014-04-09 LAB — GLUCOSE, CAPILLARY: Glucose-Capillary: 167 mg/dL — ABNORMAL HIGH (ref 70–99)

## 2014-04-09 LAB — BRAIN NATRIURETIC PEPTIDE: B Natriuretic Peptide: 708.3 pg/mL — ABNORMAL HIGH (ref 0.0–100.0)

## 2014-04-09 MED ORDER — SIMVASTATIN 20 MG PO TABS
20.0000 mg | ORAL_TABLET | Freq: Every day | ORAL | Status: DC
  Administered 2014-04-10 – 2014-04-12 (×3): 20 mg via ORAL
  Filled 2014-04-09 (×4): qty 1

## 2014-04-09 MED ORDER — TAMSULOSIN HCL 0.4 MG PO CAPS
0.4000 mg | ORAL_CAPSULE | Freq: Every day | ORAL | Status: DC
  Administered 2014-04-10 – 2014-04-18 (×9): 0.4 mg via ORAL
  Filled 2014-04-09 (×9): qty 1

## 2014-04-09 MED ORDER — ONDANSETRON HCL 4 MG/2ML IJ SOLN: 4.0000 mg | Freq: Four times a day (QID) | INTRAMUSCULAR | Status: DC | PRN

## 2014-04-09 MED ORDER — HYDROCODONE-ACETAMINOPHEN 5-325 MG PO TABS
1.0000 | ORAL_TABLET | ORAL | Status: DC | PRN
  Administered 2014-04-10 (×2): 1 via ORAL
  Filled 2014-04-09 (×2): qty 1

## 2014-04-09 MED ORDER — HYDROMORPHONE HCL 1 MG/ML IJ SOLN: 1.0000 mg | INTRAMUSCULAR | Status: DC | PRN

## 2014-04-09 MED ORDER — ONDANSETRON HCL 4 MG PO TABS: 4.0000 mg | ORAL_TABLET | Freq: Four times a day (QID) | ORAL | Status: DC | PRN

## 2014-04-09 MED ORDER — LABETALOL HCL 100 MG PO TABS
100.0000 mg | ORAL_TABLET | Freq: Two times a day (BID) | ORAL | Status: DC
  Administered 2014-04-09 – 2014-04-12 (×7): 100 mg via ORAL
  Filled 2014-04-09 (×10): qty 1

## 2014-04-09 MED ORDER — HYDRALAZINE HCL 20 MG/ML IJ SOLN
10.0000 mg | Freq: Four times a day (QID) | INTRAMUSCULAR | Status: DC | PRN
  Administered 2014-04-09: 10 mg via INTRAVENOUS
  Filled 2014-04-09 (×2): qty 1

## 2014-04-09 MED ORDER — ALUM & MAG HYDROXIDE-SIMETH 200-200-20 MG/5ML PO SUSP: 30.0000 mL | Freq: Four times a day (QID) | ORAL | Status: DC | PRN

## 2014-04-09 MED ORDER — SODIUM CHLORIDE 0.9 % IJ SOLN
3.0000 mL | Freq: Two times a day (BID) | INTRAMUSCULAR | Status: DC
  Administered 2014-04-09 – 2014-04-18 (×17): 3 mL via INTRAVENOUS

## 2014-04-09 MED ORDER — ACETAMINOPHEN 650 MG RE SUPP: 650.0000 mg | Freq: Four times a day (QID) | RECTAL | Status: DC | PRN

## 2014-04-09 MED ORDER — AMLODIPINE BESYLATE 10 MG PO TABS
10.0000 mg | ORAL_TABLET | Freq: Every day | ORAL | Status: DC
  Administered 2014-04-09 – 2014-04-18 (×10): 10 mg via ORAL
  Filled 2014-04-09 (×10): qty 1

## 2014-04-09 MED ORDER — FUROSEMIDE 10 MG/ML IJ SOLN
80.0000 mg | Freq: Three times a day (TID) | INTRAMUSCULAR | Status: DC
  Administered 2014-04-09 – 2014-04-10 (×2): 80 mg via INTRAVENOUS
  Filled 2014-04-09 (×5): qty 8

## 2014-04-09 MED ORDER — ACETAMINOPHEN 325 MG PO TABS: 650.0000 mg | ORAL_TABLET | Freq: Four times a day (QID) | ORAL | Status: DC | PRN

## 2014-04-09 MED ORDER — ENOXAPARIN SODIUM 30 MG/0.3ML ~~LOC~~ SOLN
30.0000 mg | SUBCUTANEOUS | Status: DC
  Administered 2014-04-09 – 2014-04-17 (×9): 30 mg via SUBCUTANEOUS
  Filled 2014-04-09 (×11): qty 0.3

## 2014-04-09 NOTE — Progress Notes (Signed)
New Admission Note  Arrival: via stretcher Mental Orientation: alert and oriented x4 Telemetry: TX10 NSR Assessment: See flow sheet. IV: LAC saline locked Safety Measures: Side rails in place, fall risk assessment complete with patient verbalizing understanding of risks associated with falls. Pt verbalizes an understanding of how to use the call bell and to call for help before getting out of bed. Call bell within reach, bed in lowest position, non-skid socks applied. [address] Orientation: Pt orientation to unit, room and routine. Information packet given to patient and safety video watched.  Admission armband ID verified with patient and in place.  Family: daughter and wife at bedside.  Orders have been reviewed and implemented. Will continue to monitor and assist as needed.  Velora Mediate, RN 04/09/2014 8:22 PM

## 2014-04-09 NOTE — H&P (Signed)
History and Physical       Hospital Admission Note Date: 04/09/2014  Patient name: [name] Medical record number: [id number] Date of birth: [birth year] Age: 69 y.o. Gender: male  PCP: Philis Fendt, MD    Chief Complaint:  Diffuse Swelling with generalized weakness  HPI: Patient is a 69 year old male with uncontrolled hypertension, diabetes mellitus, prior history of CVA 2008, chronic kidney disease, has not seen any nephrologist presented to ED with worsening swelling of his lower extremities, abdominal distention, overall generalized weakness. History was obtained from the patient who reported that his lower extremity swelling has been worsening in the last 1 year, was placed on Lasix 40 mg daily by his PCP 6 months ago. Denies any orthopnea or PND. Per his wife, he has abdominal distention, has been gaining weight (patient was not able to quantify), facial puffiness. Patient reports that he feels weak and had a fall outside his house as he felt his legs were heavy and weak. Patient also reports incontinence, has difficulty urinating, with urgency and frequency. On Flomax In ED, patient was noted to have creatinine of 2.4, baseline 1.4 in May 2015, troponin +0.04, BNP 708.3  Review of Systems:  Constitutional: Denies fever, chills, diaphoresis, poor appetite and + fatigue.  HEENT: Denies photophobia, eye pain, redness, hearing loss, ear pain, congestion, sore throat, rhinorrhea, sneezing, mouth sores, trouble swallowing, neck pain, neck stiffness and tinnitus.   Respiratory: Denies SOB, DOE, cough, chest tightness,  and wheezing.   Cardiovascular: Denies chest pain, palpitations and leg swelling.  Gastrointestinal: Denies nausea, vomiting, abdominal pain, diarrhea, constipation, blood in stool and abdominal distention.  Genitourinary: Denies dysuria, urgency, frequency, hematuria, flank pain and difficulty urinating.   Musculoskeletal: Denies myalgias, back pain, joint swelling, arthralgias and gait problem.  Skin: Denies pallor, rash and wound.  Neurological: Denies dizziness, seizures, syncope,light-headedness, numbness and headaches. + weakness Hematological: Denies adenopathy. Easy bruising, personal or family bleeding history  Psychiatric/Behavioral: Denies suicidal ideation, mood changes, confusion, nervousness, sleep disturbance and agitation  Past Medical History: Past Medical History  Diagnosis Date  . Hypertension   . Hypercholesterolemia   . Diabetes mellitus without complication   . Glaucoma   . Stroke     HX: of 2008  . [id number](893.8)    Past Surgical History  Procedure Laterality Date  . Colonoscopy w/ biopsies and polypectomy    . Colon surgery    . Trabeculectomy Left 08/18/2013    Procedure: TRABECULECTOMY WITH TUBE WITH [facility];  Surgeon: [name], MD;  Location: [place];  Service: Ophthalmology;  Laterality: Left;    Medications: Prior to Admission medications   Medication Sig Start Date End Date Taking? Authorizing Provider  amLODipine (NORVASC) 5 MG tablet Take 5 mg by mouth daily.    Historical Provider, MD  diclofenac (VOLTAREN) 25 MG EC tablet Take 25 mg by mouth 2 (two) times daily.    Historical Provider, MD  diclofenac sodium (VOLTAREN) 1 % GEL Apply 2 g topically 4 (four) times daily.    Historical Provider, MD  metFORMIN (GLUCOPHAGE) 1000 MG tablet Take 1,000 mg by mouth 2 (two) times daily with a meal.    Historical Provider, MD  quinapril (ACCUPRIL) 40 MG tablet Take 40 mg by mouth at bedtime.    Historical Provider, MD  simvastatin (ZOCOR) 20 MG tablet Take 20 mg by mouth daily.    Historical Provider, MD  tamsulosin (FLOMAX) 0.4 MG CAPS capsule Take 0.4 mg by mouth.    Historical Provider, MD  Allergies:  No Known Allergies  Social History:  reports that he has never smoked. He has never used smokeless tobacco. He reports that he does not drink alcohol or  use illicit drugs.  Family History: History reviewed. No pertinent family history.  Physical Exam: Blood pressure 148/99, pulse 77, temperature 98.3 F (36.8 C), temperature source Oral, resp. rate 16, height 5\' 5"  (1.651 m), weight 72.576 kg (160 lb), SpO2 99 %. General: Alert, awake, oriented x3, in no acute distress. HEENT: normocephalic, atraumatic, anicteric sclera, pink conjunctiva, pupils equal and reactive to light and accomodation, oropharynx clear Neck: supple, no masses or lymphadenopathy, no goiter, no bruits  Heart: Regular rate and rhythm, without murmurs, rubs or gallops. Lungs: Clear to auscultation bilaterally, no wheezing, rales or rhonchi. Abdomen: Soft, nontender, + distended, positive bowel sounds, no masses. Extremities: No clubbing, cyanosis 2-3+ pitting edema to thighs Neuro: no focal neurological deficits, strength 5/5 upper and lower extremities bilaterally Psych: alert and oriented x 3, normal mood and affect Skin: no rashes or lesions, warm and dry   LABS on Admission:  Basic Metabolic Panel:  Recent Labs Lab 04/09/14 1523  NA 141  K 3.9  CL 110  CO2 26  GLUCOSE 273*  BUN 34*  CREATININE 2.40*  CALCIUM 8.3*   Liver Function Tests:  Recent Labs Lab 04/09/14 1523  AST 42*  ALT 31  ALKPHOS 102  BILITOT 0.5  PROT 5.5*  ALBUMIN 1.8*   No results for input(s): LIPASE, AMYLASE in the last 168 hours. No results for input(s): AMMONIA in the last 168 hours. CBC:  Recent Labs Lab 04/09/14 1523  WBC 9.7  NEUTROABS 7.6  HGB 11.9*  HCT 35.0*  MCV 89.7  PLT 261   Cardiac Enzymes:  Recent Labs Lab 04/09/14 1523  TROPONINI 0.04*   BNP: Invalid input(s): POCBNP CBG: No results for input(s): GLUCAP in the last 168 hours.   Radiological Exams on Admission: Dg Chest 2 View  04/09/2014   CLINICAL DATA:  Weakness rete 3 days, leg swelling for few months, dry cough for 2 weeks, hypertension, diabetes  EXAM: CHEST  2 VIEW  COMPARISON:   08/18/2013  FINDINGS: Enlargement of cardiac silhouette.  Mediastinal contours and pulmonary vascularity normal.  Lungs clear.  Tiny LEFT pleural effusion.  No pneumothorax.  Bones unremarkable.  IMPRESSION: Enlargement of cardiac silhouette.  Tiny LEFT pleural effusion.   Electronically Signed   By: Lavonia Dana M.D.   On: 04/09/2014 17:25    Assessment/Plan Principal Problem:   Acute renal failure on CKD stage III: baseline Cr 1.4 in 07/2013, never seen any nephrologist with anasarca, hypoalbuminemia albumin 1.8  - Admit to tele, renal US, urine lytes, SPEP, UPEP, UA for proteinuria - hold diclofenac, metformin, ACE inhibitor. Patient has been on oral Lasix for last 6 months  - renal consult called (Dr Jonnie Finner), will likely need aggressive IV Lasix, +/- albumin infusion   Active Problems:   Hypertension: Uncontrolled accelerated - Hold norvasc and ACEI - placed on hydralazine IV  with parameters     Hypercholesterolemia - obtain lipid panel, cont statin    Diabetes mellitus without complication - obtain WYO3Z, place on sluiding scale insulin   Elevated troponin:  - obtain serial cardiac enzymes, 2Decho - EKG showed normal sinus rhythm, no acute ST-T wave changes suggestive of ischemia   BPH - Continue flomax  Severe protein calorie malnutrition, hypoalbuminemia -  Place nutrition consult  DVT prophylaxis: lovenox  CODE STATUS: Full code  Family Communication: Admission, patients condition and plan of care including tests being ordered have been discussed with the patient and wife who indicates understanding and agree with the plan and Code Status   Further plan will depend as patient's clinical course evolves and further radiologic and laboratory data become available.   Time Spent on Admission: 50 mins  Samaia Iwata M.D. [facility] 04/09/2014, 5:32 PM Pager: [phone]  If 7PM-7AM, please contact night-coverage [email or web address] Password [email or web address]

## 2014-04-09 NOTE — ED Notes (Signed)
From home brought by EMS; Steady increase in lower extremity edema bilaterally; pitting edema up to mid thighs. Denies pain, reports having difficulty walking due to the swelling.

## 2014-04-09 NOTE — ED Notes (Signed)
Pt ready to be transported to x-ray

## 2014-04-09 NOTE — ED Provider Notes (Signed)
CSN: [id number]     Arrival date & time 04/09/14  1418 History   First MD Initiated Contact with Patient 04/09/14 1430     Chief Complaint  Patient presents with  . Leg Swelling     (Consider location/radiation/quality/duration/timing/severity/associated sxs/prior Treatment) HPI Patient presents to the emergency department with increasing lower extremity edema that is been ongoing for quite a while.  The patient is unable to clarify how long that is what he states is been persistent for quite a long period of time.  He states that he is not having any chest pain, shortness of breath, nausea, vomiting, diarrhea, abdominal pain, dizziness, headache, blurred vision, fever, cough, or syncope.  The patient states that he does have some kidney impairment that he is yet to follow up with the nephrologist on patient states nothing seems make his condition, better or worse. Past Medical History  Diagnosis Date  . Hypertension   . Hypercholesterolemia   . Diabetes mellitus without complication   . Glaucoma   . Stroke     HX: of 2008  . [id number](426.8)    Past Surgical History  Procedure Laterality Date  . Colonoscopy w/ biopsies and polypectomy    . Colon surgery    . Trabeculectomy Left 08/18/2013    Procedure: TRABECULECTOMY WITH TUBE WITH [facility];  Surgeon: [name], MD;  Location: [place];  Service: Ophthalmology;  Laterality: Left;   History reviewed. No pertinent family history. History  Substance Use Topics  . Smoking status: Never Smoker   . Smokeless tobacco: Never Used  . Alcohol Use: No    Review of Systems   All other systems negative except as documented in the HPI. All pertinent positives and negatives as reviewed in the HPI.  Allergies  Review of patient's allergies indicates no known allergies.  Home Medications   Prior to Admission medications   Medication Sig Start Date End Date Taking? Authorizing Provider  amLODipine (NORVASC) 5 MG tablet Take 5 mg by mouth  daily.    Historical Provider, MD  diclofenac (VOLTAREN) 25 MG EC tablet Take 25 mg by mouth 2 (two) times daily.    Historical Provider, MD  diclofenac sodium (VOLTAREN) 1 % GEL Apply 2 g topically 4 (four) times daily.    Historical Provider, MD  metFORMIN (GLUCOPHAGE) 1000 MG tablet Take 1,000 mg by mouth 2 (two) times daily with a meal.    Historical Provider, MD  quinapril (ACCUPRIL) 40 MG tablet Take 40 mg by mouth at bedtime.    Historical Provider, MD  simvastatin (ZOCOR) 20 MG tablet Take 20 mg by mouth daily.    Historical Provider, MD  tamsulosin (FLOMAX) 0.4 MG CAPS capsule Take 0.4 mg by mouth.    Historical Provider, MD   BP 148/99 mmHg  Pulse 77  Temp(Src) 98.3 F (36.8 C) (Oral)  Resp 16  Ht 5\' 5"  (1.651 m)  Wt 160 lb (72.576 kg)  BMI 26.63 kg/m2  SpO2 99% Physical Exam  Constitutional: He is oriented to person, place, and time. He appears well-developed and well-nourished. No distress.  HENT:  Head: Normocephalic and atraumatic.  Mouth/Throat: Oropharynx is clear and moist.  Eyes: Pupils are equal, round, and reactive to light.  Neck: Normal range of motion. Neck supple.  Cardiovascular: Normal rate, regular rhythm and normal heart sounds.  Exam reveals no gallop.   No murmur heard. Pulmonary/Chest: Effort normal and breath sounds normal.  Musculoskeletal: He exhibits edema.       Legs: Neurological:  He is alert and oriented to person, place, and time. He exhibits normal muscle tone. Coordination normal.  Skin: Skin is warm and dry. No rash noted. No erythema.    ED Course  Procedures (including critical care time) Labs Review Labs Reviewed  CBC WITH DIFFERENTIAL - Abnormal; Notable for the following:    RBC 3.90 (*)    Hemoglobin 11.9 (*)    HCT 35.0 (*)    Neutrophils Relative % 78 (*)    All other components within normal limits  COMPREHENSIVE METABOLIC PANEL  TROPONIN I  BRAIN NATRIURETIC PEPTIDE    Imaging Review No results found.   EKG  Interpretation   Date/Time:  Saturday April 09 2014 16:25:18 EST Ventricular Rate:  80 PR Interval:  183 QRS Duration: 88 QT Interval:  413 QTC Calculation: 476 R Axis:   63 Text Interpretation:  Sinus rhythm Probable left atrial enlargement  Probable left ventricular hypertrophy Nonspecific T abnormalities, lateral  leads Borderline prolonged QT interval T wave abnormalities seem new since  2008 Confirmed by G[place]ON  MD, [place] [phone]) on 04/09/2014 4:32:33 PM      MDM   Final diagnoses:  None        Brent General, PA-C 04/09/14 [place], MD 04/09/14 1640

## 2014-04-09 NOTE — Consult Note (Signed)
Renal Service Consult Note [facility]  [name] 04/09/2014 [place] Requesting Physician:  Dr [name]  Reason for Consult:  Patient with CKD and fluid overload HPI: The patient is a 69 y.o. year-old with hx of HTN, DM2 and CVA presented with general weakness, inability to get up and swelling in face and extremities.  Found to have increased creatinine at 2.4 and was admitted for renal failure and anasarca with low albumin.  Was on diclofenac scheduled bid, ACEi , metformin and all are being held. Also was on norvasc, lasix 40/day, zocor, flomax at home.   History provided mostly by family, some by patient.  He has had urinary incontinence for about 2 years, he says he saw a urologist and says they said that everything was "ok" but he is very vague about details.  He wears diapers, cannot control his voiding. Denies trouble starting a stream.   He was dx'd with HTN 30 years ago when he had a light stroke and was started on BP meds.  He only took them for a little while and then had another bigger stroke in 2008 with R leg weakness and was dx'd again with HTN and started again on BP medications.  He has had DM2 for about 10 years and takes pills mostly but is prescribed to take insulin "as needed".    He denies OTC meds, but is on standing scheduled Voltaren. Also takes ACEi.  Denies hx of kidney failure in the past, no fam hx.      Date   Creat  eGFR 2008  0.80  >60 07/2013  1.49  47 04/09/14 2.40  26   Chart review: 2/02 - sigmoid colectomy with colorectal anastomosis, post op ileus 8/08 - 69 yo [place] w DM2, HTN with cough , yellow sputum > rx for PNA w LLL infiltrate 11/08 - acute R sided weakness, gait change > CT +for CVA left corona radiata. Had sig disease by dopplers so went for cerebral angiogram with no intervention  Past Medical History  Past Medical History  Diagnosis Date  . Hypertension   . Hypercholesterolemia   . Diabetes mellitus without  complication   . Glaucoma   . Stroke     HX: of 2008  . HUTMLYYT(035.4)    Past Surgical History  Past Surgical History  Procedure Laterality Date  . Colonoscopy w/ biopsies and polypectomy    . Colon surgery    . Trabeculectomy Left 08/18/2013    Procedure: TRABECULECTOMY WITH TUBE WITH [facility];  Surgeon: Matthew Pearson, MD;  Location: [place];  Service: Ophthalmology;  Laterality: Left;   Family History History reviewed. No pertinent family history. Social History  reports that he has never smoked. He has never used smokeless tobacco. He reports that he does not drink alcohol or use illicit drugs. Allergies No Known Allergies Home medications Prior to Admission medications   Medication Sig Start Date End Date Taking? Authorizing Provider  furosemide (LASIX) 40 MG tablet Take 40 mg by mouth daily.   Yes Historical Provider, MD  amLODipine (NORVASC) 5 MG tablet Take 5 mg by mouth daily.    Historical Provider, MD  diclofenac (VOLTAREN) 25 MG EC tablet Take 25 mg by mouth 2 (two) times daily.    Historical Provider, MD  diclofenac sodium (VOLTAREN) 1 % GEL Apply 2 g topically 4 (four) times daily.    Historical Provider, MD  metFORMIN (GLUCOPHAGE) 1000 MG tablet Take 1,000 mg by mouth 2 (two) times daily  with a meal.    Historical Provider, MD  quinapril (ACCUPRIL) 40 MG tablet Take 40 mg by mouth at bedtime.    Historical Provider, MD  simvastatin (ZOCOR) 20 MG tablet Take 20 mg by mouth daily.    Historical Provider, MD  tamsulosin (FLOMAX) 0.4 MG CAPS capsule Take 0.4 mg by mouth.    Historical Provider, MD   Liver Function Tests  Recent Labs Lab 04/09/14 1523  AST 42*  ALT 31  ALKPHOS 102  BILITOT 0.5  PROT 5.5*  ALBUMIN 1.8*   No results for input(s): LIPASE, AMYLASE in the last 168 hours. CBC  Recent Labs Lab 04/09/14 1523  WBC 9.7  NEUTROABS 7.6  HGB 11.9*  HCT 35.0*  MCV 89.7  PLT 503   Basic Metabolic Panel  Recent Labs Lab 04/09/14 1523  NA 141  K 3.9  CL  110  CO2 26  GLUCOSE 273*  BUN 34*  CREATININE 2.40*  CALCIUM 8.3*    Filed Vitals:   04/09/14 1427 04/09/14 1500 04/09/14 1619 04/09/14 1645  BP: 204/106 204/98 148/99 208/102  Pulse: 90 79 77 81  Temp: 98.3 F (36.8 C)     TempSrc: Oral     Resp: 16 0 16 15  Height:      Weight:      SpO2: 100% 98% 99% 93%   Exam Swollen face, not in distress, small-framed pleasant man No rash, cyanosis or gangrene Sclera anicteric, throat clear Mild jvd Chest R basilar rales, L clear RRR no MRG noted Abd soft, NTND, no HSM or masses GU w condom cath Lower ext bilat 2-3+ pitting edema mostly below the knees Neuro R leg 2/5, L leg 4/5, bilat UE's 4/5, Ox 3  UA pending CXR CM no edema   Assessment: 1. Renal failure - suspect CKD, needs initial w/u with UA, renal US to start.  2. Vol overload / anasarca - needs diuresis 3. DM 2 4. HTN longstanding 5. Hx CVA / RLE weakness (2008)   Plan - avoid NSAID's, ACEi for now and other nephrotoxins.  UA, renal US and further renal w/u as needed pending those results. Diuresis with moderately high dose lasix IV.  Will follow. Will need CKA f/u after dc also.   Matthew Splinter MD (pgr) [phone]    [phone] 04/09/2014, 6:11 PM

## 2014-04-10 ENCOUNTER — Encounter (HOSPITAL_COMMUNITY): Payer: Self-pay

## 2014-04-10 DIAGNOSIS — N179 Acute kidney failure, unspecified: Principal | ICD-10-CM

## 2014-04-10 LAB — BASIC METABOLIC PANEL
Anion gap: 4 — ABNORMAL LOW (ref 5–15)
BUN: 35 mg/dL — ABNORMAL HIGH (ref 6–23)
CALCIUM: 8.2 mg/dL — AB (ref 8.4–10.5)
CHLORIDE: 108 meq/L (ref 96–112)
CO2: 27 mmol/L (ref 19–32)
CREATININE: 2.38 mg/dL — AB (ref 0.50–1.35)
GFR calc non Af Amer: 27 mL/min — ABNORMAL LOW (ref >90)
GFR, EST AFRICAN AMERICAN: 31 mL/min — AB (ref >90)
GLUCOSE: 284 mg/dL — AB (ref 70–99)
POTASSIUM: 3.7 mmol/L (ref 3.5–5.1)
SODIUM: 139 mmol/L (ref 135–145)

## 2014-04-10 LAB — GLUCOSE, CAPILLARY
GLUCOSE-CAPILLARY: 344 mg/dL — AB (ref 70–99)
GLUCOSE-CAPILLARY: 85 mg/dL (ref 70–99)
Glucose-Capillary: 261 mg/dL — ABNORMAL HIGH (ref 70–99)
Glucose-Capillary: 219 mg/dL — ABNORMAL HIGH (ref 70–99)

## 2014-04-10 LAB — URINALYSIS, ROUTINE W REFLEX MICROSCOPIC
Bilirubin Urine: NEGATIVE
GLUCOSE, UA: 250 mg/dL — AB
Ketones, ur: NEGATIVE mg/dL
LEUKOCYTES UA: NEGATIVE
NITRITE: NEGATIVE
Protein, ur: >300 mg/dL — AB
SPECIFIC GRAVITY, URINE: 1.012 (ref 1.005–1.030)
UROBILINOGEN UA: 0.2 mg/dL (ref 0.0–1.0)
pH: 6 (ref 5.0–8.0)

## 2014-04-10 LAB — URINE MICROSCOPIC-ADD ON

## 2014-04-10 LAB — CBC
HEMATOCRIT: 32.4 % — AB (ref 39.0–52.0)
Hemoglobin: 11 g/dL — ABNORMAL LOW (ref 13.0–17.0)
MCH: 30.3 pg (ref 26.0–34.0)
MCHC: 34 g/dL (ref 30.0–36.0)
MCV: 89.3 fL (ref 78.0–100.0)
Platelets: 238 10*3/uL (ref 150–400)
RBC: 3.63 MIL/uL — ABNORMAL LOW (ref 4.22–5.81)
RDW: 13.3 % (ref 11.5–15.5)
WBC: 10.1 10*3/uL (ref 4.0–10.5)

## 2014-04-10 LAB — SODIUM, URINE, RANDOM: SODIUM UR: 91 mmol/L

## 2014-04-10 LAB — PROTEIN / CREATININE RATIO, URINE
CREATININE, URINE: 58.55 mg/dL
Protein Creatinine Ratio: 5.69 — ABNORMAL HIGH (ref 0.00–0.15)
TOTAL PROTEIN, URINE: 333 mg/dL

## 2014-04-10 LAB — PHOSPHORUS: Phosphorus: 5 mg/dL — ABNORMAL HIGH (ref 2.3–4.6)

## 2014-04-10 LAB — TROPONIN I
TROPONIN I: 0.05 ng/mL — AB (ref <0.031)
TROPONIN I: 0.15 ng/mL — AB (ref <0.031)
Troponin I: 0.23 ng/mL — ABNORMAL HIGH (ref <0.031)
Troponin I: 0.05 ng/mL — ABNORMAL HIGH (ref <0.031)

## 2014-04-10 LAB — HEMOGLOBIN A1C
HEMOGLOBIN A1C: 8.3 % — AB (ref <5.7)
Mean Plasma Glucose: 192 mg/dL — ABNORMAL HIGH (ref <117)

## 2014-04-10 MED ORDER — INSULIN ASPART 100 UNIT/ML ~~LOC~~ SOLN
0.0000 [IU] | Freq: Every day | SUBCUTANEOUS | Status: DC
  Administered 2014-04-12: 3 [IU] via SUBCUTANEOUS

## 2014-04-10 MED ORDER — INSULIN ASPART 100 UNIT/ML ~~LOC~~ SOLN
0.0000 [IU] | Freq: Three times a day (TID) | SUBCUTANEOUS | Status: DC
  Administered 2014-04-10: 7 [IU] via SUBCUTANEOUS
  Administered 2014-04-10: 3 [IU] via SUBCUTANEOUS
  Administered 2014-04-11 (×3): 1 [IU] via SUBCUTANEOUS
  Administered 2014-04-12 (×2): 3 [IU] via SUBCUTANEOUS
  Administered 2014-04-12: 2 [IU] via SUBCUTANEOUS
  Administered 2014-04-13 – 2014-04-15 (×3): 3 [IU] via SUBCUTANEOUS
  Administered 2014-04-15: 1 [IU] via SUBCUTANEOUS
  Administered 2014-04-16: 2 [IU] via SUBCUTANEOUS
  Administered 2014-04-17: 5 [IU] via SUBCUTANEOUS
  Administered 2014-04-17 – 2014-04-18 (×2): 2 [IU] via SUBCUTANEOUS
  Administered 2014-04-18: 1 [IU] via SUBCUTANEOUS
  Administered 2014-04-18: 2 [IU] via SUBCUTANEOUS

## 2014-04-10 MED ORDER — DEXTROSE 5 % IV SOLN
120.0000 mg | Freq: Three times a day (TID) | INTRAVENOUS | Status: DC
  Administered 2014-04-10 – 2014-04-12 (×6): 120 mg via INTRAVENOUS
  Filled 2014-04-10 (×8): qty 12

## 2014-04-10 MED ORDER — INSULIN ASPART 100 UNIT/ML ~~LOC~~ SOLN
3.0000 [IU] | Freq: Three times a day (TID) | SUBCUTANEOUS | Status: DC
  Administered 2014-04-10 – 2014-04-18 (×20): 3 [IU] via SUBCUTANEOUS

## 2014-04-10 MED ORDER — DEXTROMETHORPHAN POLISTIREX 30 MG/5ML PO LQCR
15.0000 mg | Freq: Two times a day (BID) | ORAL | Status: AC | PRN
  Administered 2014-04-11 – 2014-04-13 (×3): 15 mg via ORAL
  Filled 2014-04-10 (×7): qty 5

## 2014-04-10 NOTE — Plan of Care (Signed)
Problem: Phase I Progression Outcomes Goal: Hemodynamically stable Outcome: Not Progressing Blood pressure elevated yesterday.  Filed Vitals:    04/09/14 1830 04/09/14 1841 04/09/14 2000 04/09/14 2116  BP: 204/109 207/96   188/96  Pulse: 94 90   106  Temp:       98.7 F (37.1 C)  TempSrc:       Oral  Resp:          Height:          Weight:          SpO2: 93%   93%

## 2014-04-10 NOTE — Progress Notes (Signed)
  [name] Progress Note   Subjective: not diuresing very well on current lasix of 80 iv tid  Filed Vitals:   04/09/14 2000 04/09/14 2116 04/10/14 0612 04/10/14 0930  BP:  188/96 138/84 168/70  Pulse:  106 88 82  Temp:  98.7 F (37.1 C) 99.4 F (37.4 C) 97.8 F (36.6 C)  TempSrc:  Oral  Oral  Resp:   17 20  Height:      Weight:      SpO2: 93%  98% 100%   Exam: Alert, no distress, still mild facial edema Mild jvd Chest R basilar rales, L clear RRR no MRG noted Abd soft, NTND, no HSM or masses GU w condom cath 2+ bilat LE pitting edema Neuro R leg weakness, o/w nf, Ox 3  UA >300 prot, 3-6 rbc CXR CM no edema Renal US 10.3 and 11.9 cm kidneys, no hydro, ^'d echo mild Urine PCR - 5.6 gm  Assessment: 1. Renal failure - looks like CKD stage 3b/4.  Has proteinuria 5 gm, will order some serologies, just to eval for a treatable systemic disease (HIV, hepatitis,etc) as his renal failure I'm afraid is advanced stage at this time.   2. Vol overload / anasarca - cont diuresis 3. DM 2 - 10 yr hx 4. HTN longstanding 5. Hx CVA / RLE weakness (2008)  Plan - increase lasix 120 IV tid, check HIV, hep B/C, ANA, ANCA.  Myeloma studies already sent and pending.  Will need CKD f/u after dc.      Kelly Splinter MD  pager [phone]    cell [phone]  04/10/2014, 12:20 PM     Recent Labs Lab 04/09/14 1523 04/10/14 0249  NA 141 139  K 3.9 3.7  CL 110 108  CO2 26 27  GLUCOSE 273* 284*  BUN 34* 35*  CREATININE 2.40* 2.38*  CALCIUM 8.3* 8.2*    Recent Labs Lab 04/09/14 1523  AST 42*  ALT 31  ALKPHOS 102  BILITOT 0.5  PROT 5.5*  ALBUMIN 1.8*    Recent Labs Lab 04/09/14 1523 04/10/14 0249  WBC 9.7 10.1  NEUTROABS 7.6  --   HGB 11.9* 11.0*  HCT 35.0* 32.4*  MCV 89.7 89.3  PLT 261 238   . amLODipine  10 mg Oral Daily  . enoxaparin (LOVENOX) injection  30 mg Subcutaneous Q24H  . furosemide  80 mg Intravenous 3 times per day  . insulin aspart  0-5  Units Subcutaneous QHS  . insulin aspart  0-9 Units Subcutaneous TID WC  . insulin aspart  3 Units Subcutaneous TID WC  . labetalol  100 mg Oral BID  . simvastatin  20 mg Oral q1800  . sodium chloride  3 mL Intravenous Q12H  . tamsulosin  0.4 mg Oral Daily     acetaminophen **OR** acetaminophen, alum & mag hydroxide-simeth, hydrALAZINE, HYDROcodone-acetaminophen, HYDROmorphone (DILAUDID) injection, ondansetron **OR** ondansetron (ZOFRAN) IV

## 2014-04-10 NOTE — Progress Notes (Signed)
[name]  male  SWF:[id number]    DOB: 1946/02/17    DOA: 04/09/2014  PCP: Philis Fendt, MD  Brief history of present illness  Patient is a 69 year old male with uncontrolled hypertension, diabetes mellitus, prior history of CVA 2008, chronic kidney disease, has not seen any nephrologist presented to ED with worsening swelling of his lower extremities, abdominal distention, overall generalized weakness. History was obtained from the patient who reported that his lower extremity swelling has been worsening in the last 1 year, was placed on Lasix 40 mg daily by his PCP 6 months ago. Denied any orthopnea or PND. Per his wife, he has abdominal distention, has been gaining weight (patient was not able to quantify), facial puffiness. Patient reported weakness and had a fall outside his house as he felt his legs were heavy and weak. Patient also reports incontinence, has difficulty urinating, with urgency and frequency. 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, baseline creatinine 1.4 in 07/2013, never seen any nephrologist, presenting with anasarca, hypoalbuminemia, albumin 1.8 - Renal ultrasound negative for any hydronephrosis or obstruction, shows chronic renal disease - UA > proteinuria, SPEP UPEP pending, and nephrology consulted - Continue IV Lasix per renal - Insert Foley for strict I's and O's  Active Problems:   Hypertension - Restarted Norvasc, labetalol, continue hydralazine IV with parameters as needed    Hypercholesterolemia - Continue statin  Elevated troponin: Possibly due to #1, patient has no chest pain or shortness of breath or any cardiac symptoms - Follow 2-D echo, if any regional wall motion abnormalities or depressed EF, will consult cardiology    Diabetes mellitus uncontrolled with renal  complications - Placed on sliding scale insulin, obtain hemoglobin A1c, added meal coverage  BPH: Continue Flomax  DVT Prophylaxis:  Code Status:  Family Communication:  Disposition:  Consultants:  renal  Procedures:  Renal US  Antibiotics:  none    Subjective: Patient seen and examined, frustrated over multiple times he has to urinate, okay with Foley catheter, no fevers or chills or any pain  Objective: Weight change:   Intake/Output Summary (Last 24 hours) at 04/10/14 1044 Last data filed at 04/10/14 0900  Gross per 24 hour  Intake    600 ml  Output   1200 ml  Net   -600 ml   Blood pressure 168/70, pulse 82, temperature 97.8 F (36.6 C), temperature source Oral, resp. rate 20, height 5\' 5"  (1.651 m), weight 72.576 kg (160 lb), SpO2 100 %.  Physical Exam: General: Alert and awake, oriented x3, not in any acute distress. CVS: S1-S2 clear, no murmur rubs or gallops Chest: clear to auscultation bilaterally, no wheezing, rales or rhonchi Abdomen: soft nontender, nondistended, normal bowel sounds  Extremities: no cyanosis, clubbing, 2+ edema noted bilaterally Neuro: Cranial nerves II-XII intact, no focal neurological deficits  Lab Results: Basic Metabolic Panel:  Recent Labs Lab 04/09/14 1523 04/10/14 0249  NA 141 139  K 3.9 3.7  CL 110 108  CO2 26 27  GLUCOSE 273* 284*  BUN 34* 35*  CREATININE 2.40* 2.38*  CALCIUM 8.3* 8.2*   Liver Function Tests:  Recent Labs Lab 04/09/14 1523  AST 42*  ALT 31  ALKPHOS 102  BILITOT 0.5  PROT 5.5*  ALBUMIN 1.8*   No results for input(s): LIPASE, AMYLASE in the last 168  hours. No results for input(s): AMMONIA in the last 168 hours. CBC:  Recent Labs Lab 04/09/14 1523 04/10/14 0249  WBC 9.7 10.1  NEUTROABS 7.6  --   HGB 11.9* 11.0*  HCT 35.0* 32.4*  MCV 89.7 89.3  PLT 261 238   Cardiac Enzymes:  Recent Labs Lab 04/09/14 2145 04/10/14 0249 04/10/14 0938  TROPONINI 0.08* 0.23* 0.05*    BNP: Invalid input(s): POCBNP CBG:  Recent Labs Lab 04/09/14 2117 04/10/14 0725  GLUCAP 167* 261*     Micro Results: No results found for this or any previous visit (from the past 240 hour(s)).  Studies/Results: Dg Chest 2 View  04/09/2014   CLINICAL DATA:  Weakness rete 3 days, leg swelling for few months, dry cough for 2 weeks, hypertension, diabetes  EXAM: CHEST  2 VIEW  COMPARISON:  08/18/2013  FINDINGS: Enlargement of cardiac silhouette.  Mediastinal contours and pulmonary vascularity normal.  Lungs clear.  Tiny LEFT pleural effusion.  No pneumothorax.  Bones unremarkable.  IMPRESSION: Enlargement of cardiac silhouette.  Tiny LEFT pleural effusion.   Electronically Signed   By: Lavonia Dana M.D.   On: 04/09/2014 17:25   US Renal  04/10/2014   CLINICAL DATA:  Acute renal failure.  Initial encounter.  EXAM: RENAL/URINARY TRACT ULTRASOUND COMPLETE  COMPARISON:  None.  FINDINGS: Right Kidney:  Length: 10.3 cm. Increased renal parenchymal echogenicity is noted. Mild right-sided perinephric fluid is noted. No mass or hydronephrosis visualized.  Left Kidney:  Length: 11.9 cm. Mildly increased renal parenchymal echogenicity is noted. Mild left-sided perinephric fluid is seen. No mass or hydronephrosis visualized.  Bladder:  Minimal apparent echoes near the anterior bladder wall may be artifactual in nature. The bladder is otherwise unremarkable. Bilateral ureteral jets are visualized.  IMPRESSION: 1. Increased renal cortical echogenicity raises concern for medical renal disease. 2. No evidence of hydronephrosis.  Bilateral ureteral jets are seen. 3. Mild bilateral perinephric fluid noted, nonspecific in nature.   Electronically Signed   By: Garald Balding M.D.   On: 04/10/2014 04:31    Medications: Scheduled Meds: . amLODipine  10 mg Oral Daily  . enoxaparin (LOVENOX) injection  30 mg Subcutaneous Q24H  . furosemide  80 mg Intravenous 3 times per day  . labetalol  100 mg Oral BID  .  simvastatin  20 mg Oral q1800  . sodium chloride  3 mL Intravenous Q12H  . tamsulosin  0.4 mg Oral Daily      LOS: 1 day   Jameia Makris M.D. [facility] 04/10/2014, 10:44 AM Pager: [phone]  If 7PM-7AM, please contact night-coverage [email or web address] Password [email or web address]

## 2014-04-10 NOTE — Progress Notes (Signed)
UR Completed.  [phone]  

## 2014-04-11 ENCOUNTER — Encounter (HOSPITAL_COMMUNITY): Payer: Self-pay | Admitting: Nurse Practitioner

## 2014-04-11 DIAGNOSIS — I517 Cardiomegaly: Secondary | ICD-10-CM

## 2014-04-11 DIAGNOSIS — I6990 Unspecified sequelae of unspecified cerebrovascular disease: Secondary | ICD-10-CM | POA: Diagnosis present

## 2014-04-11 DIAGNOSIS — I420 Dilated cardiomyopathy: Secondary | ICD-10-CM | POA: Diagnosis present

## 2014-04-11 LAB — GLUCOSE, CAPILLARY
GLUCOSE-CAPILLARY: 140 mg/dL — AB (ref 70–99)
Glucose-Capillary: 123 mg/dL — ABNORMAL HIGH (ref 70–99)
Glucose-Capillary: 126 mg/dL — ABNORMAL HIGH (ref 70–99)
Glucose-Capillary: 95 mg/dL (ref 70–99)

## 2014-04-11 LAB — HEPATITIS PANEL, ACUTE
HCV Ab: REACTIVE — AB
HEP A IGM: NONREACTIVE
HEP B C IGM: NONREACTIVE
HEP B S AG: NEGATIVE

## 2014-04-11 LAB — BASIC METABOLIC PANEL
Anion gap: 4 — ABNORMAL LOW (ref 5–15)
BUN: 37 mg/dL — AB (ref 6–23)
CO2: 25 mmol/L (ref 19–32)
CREATININE: 2.57 mg/dL — AB (ref 0.50–1.35)
Calcium: 8 mg/dL — ABNORMAL LOW (ref 8.4–10.5)
Chloride: 108 mEq/L (ref 96–112)
GFR, EST AFRICAN AMERICAN: 28 mL/min — AB (ref >90)
GFR, EST NON AFRICAN AMERICAN: 24 mL/min — AB (ref >90)
Glucose, Bld: 108 mg/dL — ABNORMAL HIGH (ref 70–99)
Potassium: 3.3 mmol/L — ABNORMAL LOW (ref 3.5–5.1)
Sodium: 137 mmol/L (ref 135–145)

## 2014-04-11 LAB — URINE CULTURE: Colony Count: 70000

## 2014-04-11 LAB — MPO/PR-3 (ANCA) ANTIBODIES: Serine Protease 3: <1

## 2014-04-11 LAB — ANA: Anti Nuclear Antibody(ANA): NEGATIVE

## 2014-04-11 LAB — HEMOGLOBIN A1C
Hgb A1c MFr Bld: 8.2 % — ABNORMAL HIGH (ref <5.7)
Mean Plasma Glucose: 189 mg/dL — ABNORMAL HIGH (ref <117)

## 2014-04-11 MED ORDER — POTASSIUM CHLORIDE CRYS ER 20 MEQ PO TBCR
40.0000 meq | EXTENDED_RELEASE_TABLET | Freq: Once | ORAL | Status: AC
  Administered 2014-04-11: 40 meq via ORAL
  Filled 2014-04-11: qty 2

## 2014-04-11 NOTE — Progress Notes (Signed)
[name]  male  DXI:[id number]    DOB: 05-23-1945    DOA: 04/09/2014  PCP: Philis Fendt, MD  Brief history of present illness  Patient is a 69 year old male with uncontrolled hypertension, diabetes mellitus, prior history of CVA 2008, chronic kidney disease, has not seen any nephrologist presented to ED with worsening swelling of his lower extremities, abdominal distention, overall generalized weakness. History was obtained from the patient who reported that his lower extremity swelling has been worsening in the last 1 year, was placed on Lasix 40 mg daily by his PCP 6 months ago. Denied any orthopnea or PND. Per his wife, he has abdominal distention, has been gaining weight (patient was not able to quantify), facial puffiness. Patient reported weakness and had a fall outside his house as he felt his legs were heavy and weak. Patient also reports incontinence, has difficulty urinating, with urgency and frequency. 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, baseline creatinine 1.4 in 07/2013, never seen any nephrologist, presenting with anasarca, hypoalbuminemia, albumin 1.8 - Renal ultrasound negative for any hydronephrosis or obstruction, shows chronic renal disease - UA > proteinuria, SPEP UPEP pending, and nephrology following - Continue IV Lasix per renal, creatinine worsened to 2.57 today, instructed RN to hold Lasix dose until nephrology revaluation today (peripheral edema significantly improved) - Insert Foley for strict I's and O's  Active Problems:   Hypertension - Restarted Norvasc, labetalol, continue hydralazine IV with parameters as needed    Hypercholesterolemia - Continue statin  Elevated troponin: Possibly due to #1, patient has no chest pain or shortness of breath or any cardiac symptoms -2-D echo still  pending, however troponin rising, requested cardiology consult     Diabetes mellitus uncontrolled with renal complications - Placed on sliding scale insulin, hemoglobin A1c 8.2, added meal coverage  BPH: Continue Flomax  DVT Prophylaxis:  Code Status:  Family Communication:  Disposition:  Consultants:  renal  Procedures:  Renal US  Antibiotics:  none    Subjective: Patient seen and examined, frustrated over multiple times he has to urinate, okay with Foley catheter, no fevers or chills or any pain  Objective: Weight change:   Intake/Output Summary (Last 24 hours) at 04/11/14 1059 Last data filed at 04/11/14 0925  Gross per 24 hour  Intake    590 ml  Output   1475 ml  Net   -885 ml   Blood pressure 137/70, pulse 69, temperature 97.4 F (36.3 C), temperature source Oral, resp. rate 17, height 5\' 5"  (1.651 m), weight 72.576 kg (160 lb), SpO2 95 %.  Physical Exam: General: Alert and awake, oriented x3, not in any acute distress. CVS: S1-S2 clear, no murmur rubs or gallops Chest: clear to auscultation bilaterally, no wheezing, rales or rhonchi Abdomen: soft nontender, nondistended, normal bowel sounds  Extremities: no cyanosis, clubbing, trace edema noted bilaterally (significant improvement)   Lab Results: Basic Metabolic Panel:  Recent Labs Lab 04/10/14 0249 04/10/14 1552 04/11/14 0515  NA 139  --  137  K 3.7  --  3.3*  CL 108  --  108  CO2 27  --  25  GLUCOSE 284*  --  108*  BUN 35*  --  37*  CREATININE 2.38*  --  2.57*  CALCIUM 8.2*  --  8.0*  PHOS  --  5.0*  --  Liver Function Tests:  Recent Labs Lab 04/09/14 1523  AST 42*  ALT 31  ALKPHOS 102  BILITOT 0.5  PROT 5.5*  ALBUMIN 1.8*   No results for input(s): LIPASE, AMYLASE in the last 168 hours. No results for input(s): AMMONIA in the last 168 hours. CBC:  Recent Labs Lab 04/09/14 1523 04/10/14 0249  WBC 9.7 10.1  NEUTROABS 7.6  --   HGB 11.9* 11.0*  HCT 35.0* 32.4*  MCV  89.7 89.3  PLT 261 238   Cardiac Enzymes:  Recent Labs Lab 04/10/14 0938 04/10/14 1552 04/10/14 2002  TROPONINI 0.05* 0.05* 0.15*   BNP: Invalid input(s): POCBNP CBG:  Recent Labs Lab 04/10/14 0725 04/10/14 1147 04/10/14 1648 04/10/14 2051 04/11/14 0808  GLUCAP 261* 344* 219* 85 123*     Micro Results: No results found for this or any previous visit (from the past 240 hour(s)).  Studies/Results: Dg Chest 2 View  04/09/2014   CLINICAL DATA:  Weakness rete 3 days, leg swelling for few months, dry cough for 2 weeks, hypertension, diabetes  EXAM: CHEST  2 VIEW  COMPARISON:  08/18/2013  FINDINGS: Enlargement of cardiac silhouette.  Mediastinal contours and pulmonary vascularity normal.  Lungs clear.  Tiny LEFT pleural effusion.  No pneumothorax.  Bones unremarkable.  IMPRESSION: Enlargement of cardiac silhouette.  Tiny LEFT pleural effusion.   Electronically Signed   By: Lavonia Dana M.D.   On: 04/09/2014 17:25   US Renal  04/10/2014   CLINICAL DATA:  Acute renal failure.  Initial encounter.  EXAM: RENAL/URINARY TRACT ULTRASOUND COMPLETE  COMPARISON:  None.  FINDINGS: Right Kidney:  Length: 10.3 cm. Increased renal parenchymal echogenicity is noted. Mild right-sided perinephric fluid is noted. No mass or hydronephrosis visualized.  Left Kidney:  Length: 11.9 cm. Mildly increased renal parenchymal echogenicity is noted. Mild left-sided perinephric fluid is seen. No mass or hydronephrosis visualized.  Bladder:  Minimal apparent echoes near the anterior bladder wall may be artifactual in nature. The bladder is otherwise unremarkable. Bilateral ureteral jets are visualized.  IMPRESSION: 1. Increased renal cortical echogenicity raises concern for medical renal disease. 2. No evidence of hydronephrosis.  Bilateral ureteral jets are seen. 3. Mild bilateral perinephric fluid noted, nonspecific in nature.   Electronically Signed   By: Garald Balding M.D.   On: 04/10/2014 04:31     Medications: Scheduled Meds: . amLODipine  10 mg Oral Daily  . enoxaparin (LOVENOX) injection  30 mg Subcutaneous Q24H  . furosemide  120 mg Intravenous 3 times per day  . insulin aspart  0-5 Units Subcutaneous QHS  . insulin aspart  0-9 Units Subcutaneous TID WC  . insulin aspart  3 Units Subcutaneous TID WC  . labetalol  100 mg Oral BID  . simvastatin  20 mg Oral q1800  . sodium chloride  3 mL Intravenous Q12H  . tamsulosin  0.4 mg Oral Daily      LOS: 2 days   Takiah Maiden M.D. [facility] 04/11/2014, 10:59 AM Pager: [phone]  If 7PM-7AM, please contact night-coverage [email or web address] Password [email or web address]

## 2014-04-11 NOTE — Progress Notes (Signed)
Chaplain paged to assist family with AD. Chaplain explained the document, its nature and scenarios for its use. Emphasized the priority of the patients decisions etc.   Family interested in seeing one, brought the packet. The pt was sleeping.   Delford Field, Chaplain 04/11/2014

## 2014-04-11 NOTE — Progress Notes (Signed)
[facility] Rounding Note Subjective:  Has a persistent cough Eating Wife says getting weaker on his left side Edema not much change Wife says much worse swelling since October Says primary Dr. Jeanie Cooks was aware and had him on small dose of lasix  Objective Vital signs in last 24 hours: Filed Vitals:   04/10/14 1813 04/10/14 2058 04/11/14 0459 04/11/14 0945  BP: 159/71 168/83 143/61 137/70  Pulse:  92 75 69  Temp: 98 F (36.7 C) 98.2 F (36.8 C) 98.7 F (37.1 C) 97.4 F (36.3 C)  TempSrc: Oral   Oral  Resp: 20 18 18 17   Height:      Weight:      SpO2: 97% 99% 100% 95%   Weight change:   Intake/Output Summary (Last 24 hours) at 04/11/14 1208 Last data filed at 04/11/14 0925  Gross per 24 hour  Intake    590 ml  Output   1475 ml  Net   -885 ml   Physical Exam:  Blood pressure 137/70, pulse 69, temperature 97.4 F (36.3 C), temperature source Oral, resp. rate 17, height 5\' 5"  (1.651 m), weight 72.576 kg (160 lb), SpO2 95 %. Alert, no distress Mild jvd Chest decreased BS both bases Regular S1S2 No S3  no MRG noted Abd soft, NTND 2+ -3+ bilat LE pitting edema L>R Neuro L leg weakness, o/w nf, Ox 3   Recent Labs Lab 04/09/14 1523 04/10/14 0249 04/10/14 1552 04/11/14 0515  NA 141 139  --  137  K 3.9 3.7  --  3.3*  CL 110 108  --  108  CO2 26 27  --  25  GLUCOSE 273* 284*  --  108*  BUN 34* 35*  --  37*  CREATININE 2.40* 2.38*  --  2.57*  CALCIUM 8.3* 8.2*  --  8.0*  PHOS  --   --  5.0*  --     Recent Labs Lab 04/09/14 1523  AST 42*  ALT 31  ALKPHOS 102  BILITOT 0.5  PROT 5.5*  ALBUMIN 1.8*   Recent Labs Lab 04/09/14 1523 04/10/14 0249  WBC 9.7 10.1  NEUTROABS 7.6  --   HGB 11.9* 11.0*  HCT 35.0* 32.4*  MCV 89.7 89.3  PLT 261 238    Recent Labs Lab 04/09/14 2145 04/10/14 0249 04/10/14 0938 04/10/14 1552 04/10/14 2002  TROPONINI 0.08* 0.23* 0.05* 0.05* 0.15*   CBG:  Recent Labs Lab 04/10/14 1147 04/10/14 1648  04/10/14 2051 04/11/14 0808 04/11/14 1150  GLUCAP 344* 219* 85 123* 126*    Iron Studies: No results for input(s): IRON, TIBC, TRANSFERRIN, FERRITIN in the last 168 hours. Studies/Results: Dg Chest 2 View  04/09/2014   CLINICAL DATA:  Weakness rete 3 days, leg swelling for few months, dry cough for 2 weeks, hypertension, diabetes  EXAM: CHEST  2 VIEW  COMPARISON:  08/18/2013  FINDINGS: Enlargement of cardiac silhouette.  Mediastinal contours and pulmonary vascularity normal.  Lungs clear.  Tiny LEFT pleural effusion.  No pneumothorax.  Bones unremarkable.  IMPRESSION: Enlargement of cardiac silhouette.  Tiny LEFT pleural effusion.   Electronically Signed   By: Lavonia Dana M.D.   On: 04/09/2014 17:25   US Renal  04/10/2014   CLINICAL DATA:  Acute renal failure.  Initial encounter.  EXAM: RENAL/URINARY TRACT ULTRASOUND COMPLETE  COMPARISON:  None.  FINDINGS: Right Kidney:  Length: 10.3 cm. Increased renal parenchymal echogenicity is noted. Mild right-sided perinephric fluid is noted. No mass or hydronephrosis visualized.  Left Kidney:  Length: 11.9 cm. Mildly increased renal parenchymal echogenicity is noted. Mild left-sided perinephric fluid is seen. No mass or hydronephrosis visualized.  Bladder:  Minimal apparent echoes near the anterior bladder wall may be artifactual in nature. The bladder is otherwise unremarkable. Bilateral ureteral jets are visualized.  IMPRESSION: 1. Increased renal cortical echogenicity raises concern for medical renal disease. 2. No evidence of hydronephrosis.  Bilateral ureteral jets are seen. 3. Mild bilateral perinephric fluid noted, nonspecific in nature.   Electronically Signed   By: Garald Balding M.D.   On: 04/10/2014 04:31   Medications:   . amLODipine  10 mg Oral Daily  . enoxaparin (LOVENOX) injection  30 mg Subcutaneous Q24H  . furosemide  120 mg Intravenous 3 times per day  . insulin aspart  0-5 Units Subcutaneous QHS  . insulin aspart  0-9 Units  Subcutaneous TID WC  . insulin aspart  3 Units Subcutaneous TID WC  . labetalol  100 mg Oral BID  . simvastatin  20 mg Oral q1800  . sodium chloride  3 mL Intravenous Q12H  . tamsulosin  0.4 mg Oral Daily    I  have reviewed scheduled and prn medications.  Background 69 y.o. year-old with hx of HTN, DM2 and CVA and CKD (creatinine 0.8 2008, 1.49 07/2013) presented with general weakness, inability to get up and swelling in face and extremities. Found to have increased creatinine at 2.4 and was admitted for renal failure and anasarca with low albumin. Was on diclofenac scheduled bid, ACEi , metformin and all are being held.    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 Certainly c/w diabetic nephropathy althugh the + hep C is a little bit of a ringer (needs quant RNA - and if this were HepC related disease, treatment would consist of treatment of the Hep C). SPEP, UPEP, ANCA and complements are still all pending. Creatinine rising some with diuresis but this is not unexpected and would continue with the lasix.  UOP 1800 yesterday, no weight recorded since admission. Would agree with placement of foley for strict I/O. Unclear where his creatinine will settle out with diuresis. Talked with pt/wife a little about vascular access and the eventual need for HD but have not done vmap yet.   Jamal Maes, MD [facility] [phone] pager 04/11/2014, 12:08 PM

## 2014-04-11 NOTE — Plan of Care (Signed)
Problem: Food- and Nutrition-Related Knowledge Deficit (NB-1.1) Goal: Nutrition education Formal process to instruct or train a patient/client in a skill or to impart knowledge to help patients/clients voluntarily manage or modify food choices and eating behavior to maintain or improve health.  Outcome: Completed/Met Date Met:  04/11/14 Nutrition Education Note  RD consulted for a diet Education. Provided "Healthy Eating with Kidney Disease" and " Carbohydrate Counting for Diabetes" handouts to patient/family. Pt was sleepy during time of visit, but RD was able to educate patient. Reviewed food groups and provided written recommended serving sizes specifically determined for patient's current nutritional status.   Explained why diet restrictions are needed and provided lists of foods to limit/avoid that are high potassium, sodium, and phosphorus. Provided specific recommendations on safer alternatives of these foods.  Discussed importance of protein intake at each meal and snack. Reviewed foods that contain carbohydrates and the importance of consistent carbohydrate intake. Teach back method used.  Expect good compliance.  Body mass index is 26.63 kg/(m^2). Pt meets criteria for overweight based on current BMI.  Current diet order is renal/ carbohydrate modified with 1200 ml fluids, patient is consuming approximately 75-100% of meals at this time. Labs and medications reviewed. No further nutrition interventions warranted at this time. RD contact information provided. If additional nutrition issues arise, please re-consult RD.  Kallie Locks, MS, RD, LDN Pager # [phone] After hours/ weekend pager # [phone]

## 2014-04-11 NOTE — Progress Notes (Signed)
Chaplain went by pt's room from consult about Ad. Pt is sleeping. Will return later.  Delford Field, Chaplain 04/11/2014

## 2014-04-11 NOTE — Progress Notes (Signed)
  Echocardiogram 2D Echocardiogram has been performed.  Diamond Nickel 04/11/2014, 9:54 AM

## 2014-04-11 NOTE — Progress Notes (Signed)
Pts family stated they are concerned pt keeps leaning toward the right because his blood pressure had been elevated.  Paged Dr. Larinda Buttery

## 2014-04-11 NOTE — Progress Notes (Signed)
Chaplain visited with pt and family from consult.   Pt and family stated they know nothing about the AD request.   Wife stated she is just arriving and his children have been with him, but isn't aware of what's going on with her husband.   Delford Field, Chaplain 04/11/2014

## 2014-04-11 NOTE — Progress Notes (Signed)
Paged spiritual care for family to discuss advanced directive.

## 2014-04-11 NOTE — Consult Note (Signed)
CARDIOLOGY CONSULT NOTE   [name] MRN: [id number], DOB/AGE: March 26, 1945   Admit date: 04/09/2014 Date of Consult: 04/11/2014   Primary Physician: Philis Fendt, MD Primary Cardiologist: P. Johnsie Cancel, MD   Pt. Profile  69 year old male without prior cardiac history who presented to ED with diffuse swelling and generalized weakness. No CP or SOB.   Problem List  Past Medical History  Diagnosis Date  . Hypertension   . Hypercholesterolemia   . Diabetes mellitus without complication   . Glaucoma   . Stroke     a. 01/2007 L corona radiata infarct.  . Cerebrovascular disease     a. 01/2007 MRI/A: L MCA 88-82%, R MCA 80%, LICA 03%.  . CKD (chronic kidney disease), stage III   . Chronic combined systolic and diastolic CHF, NYHA class 2     a. 01/2007 Echo: EF 60-65%;  b. 03/2014 Echo: EF 45-50%, Gr 1 DD, severe LVH.  Marland Kitchen Headache     Past Surgical History  Procedure Laterality Date  . Colonoscopy w/ biopsies and polypectomy    . Colon surgery    . Trabeculectomy Left 08/18/2013    Procedure: TRABECULECTOMY WITH TUBE WITH [facility];  Surgeon: [name], MD;  Location: [place];  Service: Ophthalmology;  Laterality: Left;     Allergies  No Known Allergies  HPI   69 year old male whom is a poor historian.  Wife at bedside and able to answer questions. He has history of hypertension, DM, CVA in 2008, and chronic kidney disease. Wife states that over the past year, pt gets short of breath when he walks up 2 flights of stairs in their home and longer distances (unable to provide exact measurement of distance). Over the same period of time, he has been experiencing progressive lower extremity edema and now he has swelling in  his face/hands as well.  His wife is not aware of a prior h/o chest pain and he denies this as well.  Due to progression of symptoms along with severe wkns on 1/16, his wife decided to bring him into the ED.  There, he was markedly hypertensive with  blood pressures > 211mmHg.  He was noted to be volume overloaded on exam and his creatinine was elevated @ 2.4 (was 1.4 07/2013).  Troponin was mildly elevated and he was admitted to medicine for further evaluation and diuresis.  Troponin peaked @ 0.23 and trend has been somewhat variable but overall flat.  He has not had c/p.  An echo was performed and showed EF of 45-50% w/ grade 1 DD and severe LVH. We have been asked to eval.  Nephrology c/s is pending as well.  Inpatient Medications  . amLODipine  10 mg Oral Daily  . enoxaparin (LOVENOX) injection  30 mg Subcutaneous Q24H  . furosemide  120 mg Intravenous 3 times per day  . insulin aspart  0-5 Units Subcutaneous QHS  . insulin aspart  0-9 Units Subcutaneous TID WC  . insulin aspart  3 Units Subcutaneous TID WC  . labetalol  100 mg Oral BID  . simvastatin  20 mg Oral q1800  . sodium chloride  3 mL Intravenous Q12H  . tamsulosin  0.4 mg Oral Daily   Family History Family History  Problem Relation Age of Onset  . Other      Negative for premature CAD.    Social History History   Social History  . Marital Status: Married    Spouse Name: N/A  Number of Children: N/A  . Years of Education: N/A   Occupational History  . Not on file.   Social History Main Topics  . Smoking status: Never Smoker   . Smokeless tobacco: Never Used  . Alcohol Use: No  . Drug Use: No  . Sexual Activity: Not on file   Other Topics Concern  . Not on file   Social History Narrative    Review of Systems  General: + weight gain. +generalize weakness. No chills, fever, or night sweats.  Cardiovascular:  + dyspnea, no chest pain, orthopnea, palpitations, paroxysmal nocturnal dyspnea. Dermatological: No rash, lesions/masses Respiratory: + non-productive cough. + dyspnea with exertion.  Urologic: + Incontinent. No hematuria, dysuria Abdominal:   No nausea, vomiting, diarrhea, bright red blood per rectum, melena, or hematemesis Neurologic:  + LUE  and BLE weakness. No visual changes or changes in mental status. All other systems reviewed and are otherwise negative except as noted above.  Physical Exam  Blood pressure 137/70, pulse 69, temperature 97.4 F (36.3 C), temperature source Oral, resp. rate 17, height 5\' 5"  (1.651 m), weight 160 lb (72.576 kg), SpO2 95 %.  General: Flat affect. Generalized weakness.  Psych: Normal affect. Neuro: Alert and oriented X 3. Moves all extremities except LUE spontaneously. HEENT: Normal  Neck: Supple without bruits or JVD. Lungs:  Rhonchi audible in bil upper and lower lobes. Nonproductive cough. Resp regular and unlabored. Heart: RRR no s3, s4, or murmurs. Abdomen: Soft, non-tender, distended, BS + x 4.  Extremities: No clubbing, cyanosis or edema. DP/PT/Radials 2+ and equal bilaterally.  Labs   Recent Labs  04/10/14 0249 04/10/14 0938 04/10/14 1552 04/10/14 2002  TROPONINI 0.23* 0.05* 0.05* 0.15*   Lab Results  Component Value Date   WBC 10.1 04/10/2014   HGB 11.0* 04/10/2014   HCT 32.4* 04/10/2014   MCV 89.3 04/10/2014   PLT 238 04/10/2014    Recent Labs Lab 04/09/14 1523  04/11/14 0515  NA 141  < > 137  K 3.9  < > 3.3*  CL 110  < > 108  CO2 26  < > 25  BUN 34*  < > 37*  CREATININE 2.40*  < > 2.57*  CALCIUM 8.3*  < > 8.0*  PROT 5.5*  --   --   BILITOT 0.5  --   --   ALKPHOS 102  --   --   ALT 31  --   --   AST 42*  --   --   GLUCOSE 273*  < > 108*  < > = values in this interval not displayed.  Radiology/Studies  Dg Chest 2 View  04/09/2014   CLINICAL DATA:  Weakness rete 3 days, leg swelling for few months, dry cough for 2 weeks, hypertension, diabetes  EXAM: CHEST  2 VIEW  COMPARISON:  08/18/2013  FINDINGS: Enlargement of cardiac silhouette.  Mediastinal contours and pulmonary vascularity normal.  Lungs clear.  Tiny LEFT pleural effusion.  No pneumothorax.  Bones unremarkable.  IMPRESSION: Enlargement of cardiac silhouette.  Tiny LEFT pleural effusion.    Electronically Signed   By: Lavonia Dana M.D.   On: 04/09/2014 17:25   US Renal  04/10/2014   CLINICAL DATA:  Acute renal failure.  Initial encounter.  EXAM: RENAL/URINARY TRACT ULTRASOUND COMPLETE  COMPARISON:  None.  FINDINGS: Right Kidney:  Length: 10.3 cm. Increased renal parenchymal echogenicity is noted. Mild right-sided perinephric fluid is noted. No mass or hydronephrosis visualized.  Left Kidney:  Length: 11.9  cm. Mildly increased renal parenchymal echogenicity is noted. Mild left-sided perinephric fluid is seen. No mass or hydronephrosis visualized.  Bladder:  Minimal apparent echoes near the anterior bladder wall may be artifactual in nature. The bladder is otherwise unremarkable. Bilateral ureteral jets are visualized.  IMPRESSION: 1. Increased renal cortical echogenicity raises concern for medical renal disease. 2. No evidence of hydronephrosis.  Bilateral ureteral jets are seen. 3. Mild bilateral perinephric fluid noted, nonspecific in nature.   Electronically Signed   By: Garald Balding M.D.   On: 04/10/2014 04:31   ECG  Rsr, 80, poor r prog, lvh.  ASSESSMENT AND PLAN  1. Elevated Troponin: Pt presented with a 1 year h/o progressive lower ext edema and dyspnea followed by profound wkns.  He was markedly hypertensive in the ED and found to have and elevated creat and volume overload.  Peak troponin of 0.23 with somewhat variable but overall flat trend.  He has not had c/p.  Echo shows EF of 45-50% w/o regional variations in wall motion.  He is not having chest pain.  As he would not be a candidate for cath at this time 2/2 AKI, we will not pursue a noninvasive ischemic evaluation @ this time, though could consider this in the future if creat stabilizes.  Cont bb, statin.  Add low dose ASA.  2. Congestive dilated cardiomyopathy/Acute combined systolic and diastolic CHF: EF: 38-93% by echo this admission.  Still with some degree of volume overload and abdominal distention.  Wt is up ~12 lbs  since 07/2013.  Cont diuresis at the discretion of nephrology.  Cont bb.  With mild LV dysfxn and volume overload, could add hydralazine/nitrate for afterload reduction.  Consider resumption of acei (was on @ home) if creat improves/returns to prior baseline.  3.  Acute on chronic stage III kidney dzs:  BUN/Creat: 37 and 2.57 respectively - creat was 1.4 in 07/2013.  Renal U/S - medical renal disease.  Nephrology following.  4. Hypertension: improved since admission on labetalol and amlodipine.  5.  DM:  Per IM.  Signed, Murray Hodgkins, NP 04/11/2014, 2:14 PM   Patient examined chart reviewed.  History mostly from wife.  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  Exam with some LUE weakness and LE edema.  Discussed meds with Dr Azzie Roup.  If Cr ends up over 3 with diuresis will add hydralazine and nitrrates for BP control and decreased EF  If under 3 can resume ACE inhibitor Ongoing w/u of weakness, CVA and acute on chronic renal failure per nephrology and primary service    Jenkins Rouge

## 2014-04-12 ENCOUNTER — Inpatient Hospital Stay (HOSPITAL_COMMUNITY): Payer: Commercial Managed Care - HMO

## 2014-04-12 LAB — PROTEIN ELECTROPHORESIS, SERUM
ALBUMIN ELP: 40.3 % — AB (ref 55.8–66.1)
ALPHA-2-GLOBULIN: 23.5 % — AB (ref 7.1–11.8)
Alpha-1-Globulin: 7.6 % — ABNORMAL HIGH (ref 2.9–4.9)
Beta 2: 6.9 % — ABNORMAL HIGH (ref 3.2–6.5)
Beta Globulin: 6.2 % (ref 4.7–7.2)
GAMMA GLOBULIN: 15.5 % (ref 11.1–18.8)
M-SPIKE, %: NOT DETECTED g/dL
TOTAL PROTEIN ELP: 5.3 g/dL — AB (ref 6.0–8.3)

## 2014-04-12 LAB — RENAL FUNCTION PANEL
ALBUMIN: 1.7 g/dL — AB (ref 3.5–5.2)
ANION GAP: 10 (ref 5–15)
BUN: 42 mg/dL — AB (ref 6–23)
CALCIUM: 8 mg/dL — AB (ref 8.4–10.5)
CHLORIDE: 104 meq/L (ref 96–112)
CO2: 23 mmol/L (ref 19–32)
Creatinine, Ser: 3.04 mg/dL — ABNORMAL HIGH (ref 0.50–1.35)
GFR calc Af Amer: 23 mL/min — ABNORMAL LOW (ref >90)
GFR calc non Af Amer: 20 mL/min — ABNORMAL LOW (ref >90)
Glucose, Bld: 170 mg/dL — ABNORMAL HIGH (ref 70–99)
POTASSIUM: 4.1 mmol/L (ref 3.5–5.1)
Phosphorus: 5.1 mg/dL — ABNORMAL HIGH (ref 2.3–4.6)
Sodium: 137 mmol/L (ref 135–145)

## 2014-04-12 LAB — HCV RNA QUANT
HCV QUANT LOG: 5.87 {Log} — AB (ref <1.18)
HCV QUANT: 734935 [IU]/mL — AB (ref <15)

## 2014-04-12 LAB — LIPID PANEL
Cholesterol: 268 mg/dL — ABNORMAL HIGH (ref 0–200)
HDL: 58 mg/dL (ref >39)
LDL CALC: 185 mg/dL — AB (ref 0–99)
Total CHOL/HDL Ratio: 4.6 RATIO
Triglycerides: 124 mg/dL (ref <150)
VLDL: 25 mg/dL (ref 0–40)

## 2014-04-12 LAB — UIFE/LIGHT CHAINS/TP QN, 24-HR UR
ALBUMIN, U: DETECTED
ALPHA 2 UR: DETECTED — AB
Alpha 1, Urine: DETECTED — AB
BETA UR: DETECTED — AB
Gamma Globulin, Urine: DETECTED — AB
TOTAL PROTEIN, URINE-UPE24: 310 mg/dL — AB (ref 5–25)

## 2014-04-12 LAB — GLUCOSE, CAPILLARY
GLUCOSE-CAPILLARY: 179 mg/dL — AB (ref 70–99)
GLUCOSE-CAPILLARY: 206 mg/dL — AB (ref 70–99)
Glucose-Capillary: 229 mg/dL — ABNORMAL HIGH (ref 70–99)
Glucose-Capillary: 158 mg/dL — ABNORMAL HIGH (ref 70–99)
Glucose-Capillary: 276 mg/dL — ABNORMAL HIGH (ref 70–99)

## 2014-04-12 LAB — HIV ANTIBODY (ROUTINE TESTING W REFLEX): HIV-1/HIV-2 Ab: NONREACTIVE

## 2014-04-12 LAB — C4 COMPLEMENT: COMPLEMENT C4, BODY FLUID: 35 mg/dL (ref 10–40)

## 2014-04-12 LAB — C3 COMPLEMENT: C3 Complement: 134 mg/dL (ref 90–180)

## 2014-04-12 MED ORDER — ASPIRIN 325 MG PO TABS
325.0000 mg | ORAL_TABLET | Freq: Every day | ORAL | Status: DC
Start: 2014-04-12 — End: 2014-04-13
  Administered 2014-04-12 – 2014-04-13 (×2): 325 mg via ORAL
  Filled 2014-04-12 (×2): qty 1

## 2014-04-12 MED ORDER — FUROSEMIDE 80 MG PO TABS
160.0000 mg | ORAL_TABLET | Freq: Two times a day (BID) | ORAL | Status: DC
  Administered 2014-04-12 – 2014-04-16 (×8): 160 mg via ORAL
  Filled 2014-04-12 (×10): qty 2

## 2014-04-12 MED ORDER — STARCH (THICKENING) PO POWD
ORAL | Status: DC | PRN
  Filled 2014-04-12: qty 227

## 2014-04-12 NOTE — Progress Notes (Signed)
Good urine output, however, renal function declining. Appreciate nephrology recommendations. Troponin is more consistent with renal failure - catheterization is not an option at this point, as it would be highly likely to lead to dialysis. Hydralazine would be preferred from a CHF standpoint to amlodipine. Consider changing to bidil per IM service.  Will follow-up tomorrow.  Pixie Casino, MD, [facility] Attending Cardiologist [place]

## 2014-04-12 NOTE — Consult Note (Signed)
Referring Physician: Rai    Chief Complaint: Left sided weakness  HPI: [name] is an 69 y.o. male who was admitted due to lower extremity weakness.  Was noted by family to not be moving his left arm as well on Sunday afternoon.  By Monday morning they report that he was not moving his upper extremity at all.  Nursing was notified at that time and work up was initiated. Patient with history of stroke in the past that affected his right side.  Much of that strength has returned.      Date last known well: Date: 04/10/2014 Time last known well: Unable to determine tPA Given: No: Outside time window  Past Medical History  Diagnosis Date  . Hypertension   . Hypercholesterolemia   . Diabetes mellitus without complication   . Glaucoma   . Stroke     a. 01/2007 L corona radiata infarct.  . Cerebrovascular disease     a. 01/2007 MRI/A: L MCA 12-45%, R MCA 80%, LICA 99%.  . CKD (chronic kidney disease), stage III   . Chronic combined systolic and diastolic CHF, NYHA class 2     a. 01/2007 Echo: EF 60-65%;  b. 03/2014 Echo: EF 45-50%, Gr 1 DD, severe LVH.  Marland Kitchen Headache     Past Surgical History  Procedure Laterality Date  . Colonoscopy w/ biopsies and polypectomy    . Colon surgery    . Trabeculectomy Left 08/18/2013    Procedure: TRABECULECTOMY WITH TUBE WITH [facility];  Surgeon: [name], MD;  Location: [place];  Service: Ophthalmology;  Laterality: Left;    Family History  Problem Relation Age of Onset  . Other      Negative for premature CAD.   Social History:  reports that he has never smoked. He has never used smokeless tobacco. He reports that he does not drink alcohol or use illicit drugs.  Allergies: No Known Allergies  Medications:  I have reviewed the patient's current medications. Prior to Admission:  Prescriptions prior to admission  Medication Sig Dispense Refill Last Dose  . amLODipine (NORVASC) 5 MG tablet Take 5 mg by mouth daily.   Past Week at Unknown time   . diclofenac (VOLTAREN) 25 MG EC tablet Take 25 mg by mouth 2 (two) times daily.   Past Week at Unknown time  . diclofenac sodium (VOLTAREN) 1 % GEL Apply 2 g topically 4 (four) times daily.   Past Week at Unknown time  . furosemide (LASIX) 40 MG tablet Take 40 mg by mouth daily.   Past Week at Unknown time  . metFORMIN (GLUCOPHAGE) 1000 MG tablet Take 1,000 mg by mouth 2 (two) times daily with a meal.   Past Week at Unknown time  . quinapril (ACCUPRIL) 40 MG tablet Take 40 mg by mouth at bedtime.   Past Week at Unknown time  . simvastatin (ZOCOR) 20 MG tablet Take 20 mg by mouth daily.   Past Week at Unknown time  . tamsulosin (FLOMAX) 0.4 MG CAPS capsule Take 0.4 mg by mouth.   Past Week at Unknown time   Scheduled: . amLODipine  10 mg Oral Daily  . aspirin  325 mg Oral Daily  . enoxaparin (LOVENOX) injection  30 mg Subcutaneous Q24H  . furosemide  160 mg Oral BID  . insulin aspart  0-5 Units Subcutaneous QHS  . insulin aspart  0-9 Units Subcutaneous TID WC  . insulin aspart  3 Units Subcutaneous TID WC  . labetalol  100  mg Oral BID  . simvastatin  20 mg Oral q1800  . sodium chloride  3 mL Intravenous Q12H  . tamsulosin  0.4 mg Oral Daily    ROS: History obtained from the patient  General ROS: negative for - chills, fatigue, fever, night sweats, weight gain or weight loss Psychological ROS: negative for - behavioral disorder, hallucinations, memory difficulties, mood swings or suicidal ideation Ophthalmic ROS: negative for - blurry vision, double vision, eye pain or loss of vision ENT ROS: negative for - epistaxis, nasal discharge, oral lesions, sore throat, tinnitus or vertigo Allergy and Immunology ROS: negative for - hives or itchy/watery eyes Hematological and Lymphatic ROS: negative for - bleeding problems, bruising or swollen lymph nodes Endocrine ROS: negative for - galactorrhea, hair pattern changes, polydipsia/polyuria or temperature intolerance Respiratory ROS: negative  for - cough, hemoptysis, shortness of breath or wheezing Cardiovascular ROS: edema Gastrointestinal ROS: negative for - abdominal pain, diarrhea, hematemesis, nausea/vomiting or stool incontinence Genito-Urinary ROS: negative for - dysuria, hematuria, incontinence or urinary frequency/urgency Musculoskeletal ROS:  weakness Neurological ROS: as noted in HPI Dermatological ROS: negative for rash and skin lesion changes  Physical Examination: Blood pressure 164/81, pulse 78, temperature 97.7 F (36.5 C), temperature source Axillary, resp. rate 16, height 5\' 5"  (1.651 m), weight 72.576 kg (160 lb), SpO2 100 %.  HEENT-  Normocephalic, no lesions, without obvious abnormality.  Head turned to the right. Normal external eye and conjunctiva.  Normal TM's bilaterally.  Normal auditory canals and external ears. Normal external nose, mucus membranes and septum.  Normal pharynx. Cardiovascular- S1, S2 normal, pulses palpable throughout   Lungs- chest clear, no wheezing, rales, normal symmetric air entry Abdomen- soft, non-tender; bowel sounds normal; no masses,  no organomegaly Extremities- minimal edema Lymph-no adenopathy palpable Musculoskeletal-no joint tenderness, deformity or swelling Skin-warm and dry, no hyperpigmentation, vitiligo, or suspicious lesions  Neurological Examination Mental Status: Alert, oriented, thought content appropriate.  Speech fluent but slurred and with a delay.  Able to follow 3 step commands without difficulty. Cranial Nerves: II: Discs flat bilaterally; LHH, pupils equal, round, reactive to light and accommodation III,IV, VI: ptosis not present, extra-ocular motions intact bilaterally V,VII: left facial droop, facial light touch sensation normal bilaterally VIII: hearing normal bilaterally IX,X: gag reflex present XI: decreased left shoulder shoulder shrug XII: midline tongue extension Motor: Right : Upper extremity   5/5    Left:     Upper extremity   2/5 with  4/5 hand grip  Lower extremity   2-3/5    Lower extremity   0/5 Tone and bulk:normal tone throughout; no atrophy noted Sensory: Pinprick and light touch intact throughout, bilaterally Deep Tendon Reflexes: 2+ on the right, 1+in the LUE and absent in the LLE.  RLE AJ absent.   Plantars: Right: upgoing   Left: downgoing Cerebellar: normal finger-to-nose testing on the right.  Unable to perform otherwise due to weakness Gait: Unable to perform due to weakness    Laboratory Studies:  Basic Metabolic Panel:  Recent Labs Lab 04/09/14 1523 04/10/14 0249 04/10/14 1552 04/11/14 0515 04/12/14 0424  NA 141 139  --  137 137  K 3.9 3.7  --  3.3* 4.1  CL 110 108  --  108 104  CO2 26 27  --  25 23  GLUCOSE 273* 284*  --  108* 170*  BUN 34* 35*  --  37* 42*  CREATININE 2.40* 2.38*  --  2.57* 3.04*  CALCIUM 8.3* 8.2*  --  8.0* 8.0*  PHOS  --   --  5.0*  --  5.1*    Liver Function Tests:  Recent Labs Lab 04/09/14 1523 04/12/14 0424  AST 42*  --   ALT 31  --   ALKPHOS 102  --   BILITOT 0.5  --   PROT 5.5*  --   ALBUMIN 1.8* 1.7*   No results for input(s): LIPASE, AMYLASE in the last 168 hours. No results for input(s): AMMONIA in the last 168 hours.  CBC:  Recent Labs Lab 04/09/14 1523 04/10/14 0249  WBC 9.7 10.1  NEUTROABS 7.6  --   HGB 11.9* 11.0*  HCT 35.0* 32.4*  MCV 89.7 89.3  PLT 261 238    Cardiac Enzymes:  Recent Labs Lab 04/09/14 2145 04/10/14 0249 04/10/14 0938 04/10/14 1552 04/10/14 2002  TROPONINI 0.08* 0.23* 0.05* 0.05* 0.15*    BNP: Invalid input(s): POCBNP  CBG:  Recent Labs Lab 04/11/14 1700 04/11/14 2221 04/12/14 0729 04/12/14 0952 04/12/14 1131  GLUCAP 140* 95 179* 206* 229*    Microbiology: Results for orders placed or performed during the hospital encounter of 04/09/14  Urine culture     Status: None   Collection Time: 04/10/14  6:15 AM  Result Value Ref Range Status   Specimen Description URINE, CLEAN CATCH  Final    Special Requests NONE  Final   Colony Count   Final    70,000 COLONIES/ML Performed at [facility]    Culture   Final    Multiple bacterial morphotypes present, none predominant. Suggest appropriate recollection if clinically indicated. Performed at [facility]    Report Status 04/11/2014 FINAL  Final    Coagulation Studies: No results for input(s): LABPROT, INR in the last 72 hours.  Urinalysis:  Recent Labs Lab 04/10/14 0615  COLORURINE YELLOW  LABSPEC 1.012  PHURINE 6.0  GLUCOSEU 250*  HGBUR TRACE*  BILIRUBINUR NEGATIVE  KETONESUR NEGATIVE  PROTEINUR >300*  UROBILINOGEN 0.2  NITRITE NEGATIVE  LEUKOCYTESUR NEGATIVE    Lipid Panel:    Component Value Date/Time   CHOL 268* 04/12/2014 0424   TRIG 124 04/12/2014 0424   HDL 58 04/12/2014 0424   CHOLHDL 4.6 04/12/2014 0424   VLDL 25 04/12/2014 0424   LDLCALC 185* 04/12/2014 0424    HgbA1C:  Lab Results  Component Value Date   HGBA1C 8.2* 04/10/2014    Urine Drug Screen:  No results found for: LABOPIA, COCAINSCRNUR, LABBENZ, AMPHETMU, THCU, LABBARB  Alcohol Level: No results for input(s): ETH in the last 168 hours.  Other results: EKG: sinus rhythm at 80 bpm.  Imaging: Mr Brain Wo Contrast  04/12/2014   CLINICAL DATA:  69 year old male with left side weakness. Uncontrolled hypertension and diabetes. Worsening extremity swelling. Initial encounter.  EXAM: MRI HEAD WITHOUT CONTRAST  TECHNIQUE: Multiplanar, multiecho pulse sequences of the brain and surrounding structures were obtained without intravenous contrast.  COMPARISON:  Brain MRI 02/02/2007.  Cerebral angiogram 02/04/2007.  FINDINGS: Confluent restricted diffusion tracking from the right corona radiata into the deep gray matter nuclei as well as the right temporal stem. The mesial right temporal lobe is spared. There is some involvement of the right thalamus.  Associated T2 and FLAIR hyperintensity. No associated hemorrhage or significant  mass effect.  Major intracranial vascular flow voids are stable.  No contralateral or posterior fossa restricted diffusion. Cerebral volume has decreased since 2008. Mild ex vacuo ventricle enlargement has occurred. Mildly progressed and widespread Patchy and confluent cerebral white matter T2 and FLAIR hyperintensity.  Occasional small areas of cortical encephalomalacia in both MCA territories. Progressed chronic T2 abnormality in the deep gray matter nuclei, especially the left thalamus. Increased T2 abnormality in the central on left pons. Cerebellum remains within normal limits.  No midline shift, mass effect, or evidence of intracranial mass lesion. No acute intracranial hemorrhage identified. Negative pituitary and cervicomedullary junction. Partially visible cervical spine degeneration. Bone marrow signal within normal limits. Visible internal auditory structures appear normal. Mastoids are clear. Mild paranasal sinus mucosal thickening is similar to the prior exam. Visualized orbit soft tissues are within normal limits. Visualized scalp soft tissues are within normal limits.  IMPRESSION: 1. Patchy acute infarct in the right hemisphere affecting the corona radiata and deep gray matter nuclei. No associated hemorrhage or mass effect. 2. 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   Electronically Signed   By: Lars Pinks M.D.   On: 04/12/2014 09:26    Assessment: 69 y.o. male with new onset left sided weakness.  MRI of the brain personally reviewed and shows an acute right corona radiata infarct.  Patient with multiple vascular risk factors and on no antiplatelet therapy prior to admission.  Echocardiogram shows LVH with an EF of 45-50%.  Further work up recommended.    Stroke Risk Factors - diabetes mellitus, hyperlipidemia and hypertension  Plan: 1. HgbA1c, fasting lipid panel 2. PT consult, OT consult, Speech consult 3. Carotid dopplers 4. Prophylactic  therapy-Antiplatelet med: Aspirin - dose 325mg  daily 5. NPO until RN stroke swallow screen 6. Telemetry monitoring 7. Frequent neuro checks   Alexis Goodell, MD [facility] [phone] 04/12/2014, 4:32 PM

## 2014-04-12 NOTE — Progress Notes (Signed)
[facility] Rounding Note Subjective:  Adequate UOP Swelling and abd distension  improving Feels better with exception of worsened weakness left arm (new R hemispheric CVA) I discussed with patient, wife and other family that pt now Stage 4 CKD - have advised VMap and VVS evaluatin - "they will let me know tomorrow"   Objective Vital signs in last 24 hours: Filed Vitals:   04/11/14 1745 04/11/14 2052 04/12/14 0548 04/12/14 0949  BP: 154/72 142/68 154/76 164/81  Pulse: 65 80 72 78  Temp: 98.4 F (36.9 C) 99.6 F (37.6 C) 97.6 F (36.4 C) 97.7 F (36.5 C)  TempSrc: Oral Oral Axillary Axillary  Resp: 17 16 14 16   Height:      Weight:      SpO2: 96% 96% 95% 100%   Weight change:   Intake/Output Summary (Last 24 hours) at 04/12/14 1144 Last data filed at 04/12/14 1058  Gross per 24 hour  Intake    250 ml  Output   2300 ml  Net  -2050 ml   Physical Exam:  Blood pressure 137/70, pulse 69, temperature 97.4 F (36.3 C), temperature source Oral, resp. rate 17, height 5\' 5"  (1.651 m), weight 72.576 kg (160 lb), SpO2 95 %. Alert, no distress Mild jvd Chest decreased BS both bases Regular S1S2 No S3  no MRG noted Abd soft, NTND 2+ LE edema - some better but still some pitting to the hips Left arm flaccid More alert today and conversant   Recent Labs Lab 04/09/14 1523 04/10/14 0249 04/10/14 1552 04/11/14 0515 04/12/14 0424  NA 141 139  --  137 137  K 3.9 3.7  --  3.3* 4.1  CL 110 108  --  108 104  CO2 26 27  --  25 23  GLUCOSE 273* 284*  --  108* 170*  BUN 34* 35*  --  37* 42*  CREATININE 2.40* 2.38*  --  2.57* 3.04*  CALCIUM 8.3* 8.2*  --  8.0* 8.0*  PHOS  --   --  5.0*  --  5.1*    Recent Labs Lab 04/09/14 1523 04/12/14 0424  AST 42*  --   ALT 31  --   ALKPHOS 102  --   BILITOT 0.5  --   PROT 5.5*  --   ALBUMIN 1.8* 1.7*    Recent Labs Lab 04/09/14 1523 04/10/14 0249  WBC 9.7 10.1  NEUTROABS 7.6  --   HGB 11.9* 11.0*  HCT 35.0*  32.4*  MCV 89.7 89.3  PLT 261 238    Recent Labs Lab 04/09/14 2145 04/10/14 0249 04/10/14 0938 04/10/14 1552 04/10/14 2002  TROPONINI 0.08* 0.23* 0.05* 0.05* 0.15*   CBG:  Recent Labs Lab 04/11/14 1700 04/11/14 2221 04/12/14 0729 04/12/14 0952 04/12/14 1131  GLUCAP 140* 95 179* 206* 229*    Iron Studies: No results for input(s): IRON, TIBC, TRANSFERRIN, FERRITIN in the last 168 hours. Studies/Results: Mr Herby Abraham Contrast  04/12/2014   CLINICAL DATA:  69 year old male with left side weakness. Uncontrolled hypertension and diabetes. Worsening extremity swelling. Initial encounter.  EXAM: MRI HEAD WITHOUT CONTRAST  TECHNIQUE: Multiplanar, multiecho pulse sequences of the brain and surrounding structures were obtained without intravenous contrast.  COMPARISON:  Brain MRI 02/02/2007.  Cerebral angiogram 02/04/2007.  FINDINGS: Confluent restricted diffusion tracking from the right corona radiata into the deep gray matter nuclei as well as the right temporal stem. The mesial right temporal lobe is spared. There is some involvement of the right  thalamus.  Associated T2 and FLAIR hyperintensity. No associated hemorrhage or significant mass effect.  Major intracranial vascular flow voids are stable.  No contralateral or posterior fossa restricted diffusion. Cerebral volume has decreased since 2008. Mild ex vacuo ventricle enlargement has occurred. Mildly progressed and widespread Patchy and confluent cerebral white matter T2 and FLAIR hyperintensity. Occasional small areas of cortical encephalomalacia in both MCA territories. Progressed chronic T2 abnormality in the deep gray matter nuclei, especially the left thalamus. Increased T2 abnormality in the central on left pons. Cerebellum remains within normal limits.  No midline shift, mass effect, or evidence of intracranial mass lesion. No acute intracranial hemorrhage identified. Negative pituitary and cervicomedullary junction. Partially visible  cervical spine degeneration. Bone marrow signal within normal limits. Visible internal auditory structures appear normal. Mastoids are clear. Mild paranasal sinus mucosal thickening is similar to the prior exam. Visualized orbit soft tissues are within normal limits. Visualized scalp soft tissues are within normal limits.  IMPRESSION: 1. Patchy acute infarct in the right hemisphere affecting the corona radiata and deep gray matter nuclei. No associated hemorrhage or mass effect. 2. 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   Electronically Signed   By: Lars Pinks M.D.   On: 04/12/2014 09:26   Medications:   . amLODipine  10 mg Oral Daily  . aspirin  325 mg Oral Daily  . enoxaparin (LOVENOX) injection  30 mg Subcutaneous Q24H  . furosemide  120 mg Intravenous 3 times per day  . insulin aspart  0-5 Units Subcutaneous QHS  . insulin aspart  0-9 Units Subcutaneous TID WC  . insulin aspart  3 Units Subcutaneous TID WC  . labetalol  100 mg Oral BID  . simvastatin  20 mg Oral q1800  . sodium chloride  3 mL Intravenous Q12H  . tamsulosin  0.4 mg Oral Daily    I  have reviewed scheduled and prn medications.  Background 69 y.o. year-old with hx of HTN, DM2 and CVA and CKD (creatinine 0.8 2008, 1.49 07/2013) presented with general weakness, inability to get up and swelling in face and extremities. Found to have increased creatinine at 2.4 and was admitted for renal failure and anasarca with low albumin. Was on diclofenac scheduled bid, ACEi , metformin - all held.    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 Certainly c/w diabetic nephropathy althugh the + hep C is a little bit of a ringer (needs quant RNA - and if this were HepC related disease,  treatment would consist of treatment of the Hep C). SPEP, UPEP, ANCA and ANA negative and normal complements. Creatinine rising some with diuresis but this is not unexpected and would continue with the lasix, though will change to po.  UOP 2300 yesterday, edema is improving. Still with a lot of edema. I have discussed that his kidney disease is advanced and that I would recommend going ahead with vein mapping, VVS evaluation - wife says she will "let me know tomorrow".  She will talk with pt and other family (several nurses in the family - she is well aware of the ramifications of dialysis). Show dialysis videos.   Nephrotic syndrome - as above. 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 Neuro to see  Elevated troponin. EF 45-50%  No CP or cardiac sx. Not good candidate for ACE with advanced CKD  DM - per primary  HTN - meds  HLD statin  Anemia - no ESA indicated at this time  Hep C+ - viral load pending.    Jamal Maes, MD [facility] [phone] pager 04/12/2014, 11:44 AM

## 2014-04-12 NOTE — Progress Notes (Signed)
Pt. wife declined to view dialysis videos.  Attempted X3.  Wife still refused.  Stated she will review tomorrow. Wife has left for today.  Will attempt again tomorrow.  Ellyson Rarick Gala Lewandowsky SN

## 2014-04-12 NOTE — Progress Notes (Addendum)
[name]  male  JSE:[id number]    DOB: 07/26/1945    DOA: 04/09/2014  PCP: Philis Fendt, MD  Brief history of present illness  Patient is a 69 year old male with uncontrolled hypertension, diabetes mellitus, prior history of CVA 2008, chronic kidney disease stage III, has not seen any nephrologist presented to ED with worsening swelling of his lower extremities, abdominal distention, overall generalized weakness. History was obtained from the patient who reported that his lower extremity swelling has been worsening in the last 1 year, was placed on Lasix 40 mg daily by his PCP 6 months ago. Denied any orthopnea or PND. Per his wife, he has abdominal distention, has been gaining weight (patient was not able to quantify), facial puffiness. Patient reported weakness and had a fall outside his house as he felt his legs were heavy and weak. Patient also reports incontinence, has difficulty urinating, with urgency and frequency. On Flomax In ED, patient was noted to have creatinine of 2.4, baseline 1.4 in May 2015, troponin +0.04, BNP 708.3. Patient is admitted for acute on chronic renal failure, and nephrology is on board.  04/12/14- Pt with c/o's of left sided weakness and evidence of new CVA on MRI, neurology is consulted.  Assessment/Plan:  Principal Problem: Acute renal failure on CK D stage III, baseline creatinine 1.4 in 07/2013, never seen any nephrologist, presenting with anasarca, hypoalbuminemia, albumin 1.8 - Renal ultrasound negative for any hydronephrosis or obstruction, shows chronic renal disease -UA > proteinuria, SPEP UPEP pending, and nephrology following - Creatinine worsened to 3.04.  Per renal- rise in creat is expected, transitioned from IV lasix to oral Lasix160mg  BID - Continue strict I's and O's, Foley catheter placed  Acute Right Hemisphere CVA -Pt with left sided weakness. Pt with hx of CVA with minimal right sided weakness deficit, wife states pt  takes ASA 325mg  but patient poor historian did not mention at admission. Currently on aspirin. -MRI brain-acute infarct in right hemisphere affecting corona radiata dn deep gray matter nuclei -Neurology consulted, called Dr Doy Mince, continue neuro checks, ordered lipid panel, hemoglobin A1c - PTOT ST evaluation ordered - 2-D echo done showed EF of 07-37%, grade 1 diastolic dysfunction, cardiology following - Carotid Dopplers ordered  Elevated troponin: Possibly due to #1, pt with no cp, sob, or any cardiac symptoms  -Due to rising troponin, requested cardiology consult  -2D echo- EF 45-50%, severe LVH, w/o variations in wall motion -Per cardiology- Add low dose ASA. Not candidate for cath due to AKI, can consider noninvasive evaluation in future if creat stabilizes. Cont bb, statin  Diabetes mellitus uncontrolled with renal complications - Placed on sliding scale insulin, hemoglobin A1c 8.2, added meal coverage  Hypertension - Restarted Norvasc, labetalol, continue hydralazine IV with parameters as needed  Hypercholesterolemia - Continue statin  BPH -Continue Flomax  Hx of CVA -Deficits of minimal right sided weakness -On ASA 325 at home?   DVT Prophylaxis: SQ Lovenox  Code Status: FULL  Family Communication:  Discussed with patient's daughter and sister-in-law this morning  Disposition:  Consultants:  Renal  Cardiology  Procedures:  Renal US  2D echo- 45-50%, severe LVH, mildly reduced systolic function, no wall motion normal  Antibiotics:  none   Subjective: C/o's of left sided weakness.  Denies any cp, sob, difficulty swallowing Objective: Weight change:   Intake/Output Summary (Last 24 hours) at 04/12/14 1422 Last data filed at 04/12/14 1058  Gross per 24 hour  Intake    250 ml  Output   2300 ml  Net  -2050 ml   Blood pressure 164/81, pulse 78, temperature 97.7 F (36.5 C), temperature source Axillary, resp. rate 16, height 5\' 5"  (1.651 m),  weight 72.576 kg (160 lb), SpO2 100 %.  Physical Exam: General:Alert and awake, oriented x3 male in NAD  CVS: S1-S2 clear, no murmur rubs or gallops Chest: clear to auscultation bilaterally, no wheezing, rales or rhonchi Abdomen: soft nontender, nondistended, normal bowel sounds  Extremities: No cyanosis, clubbing, trace edema noted bilaterally (significant improvement).   Neuro: Right upper and lower extremities 5/5, LUE 2/5, LLE 0/5  Lab Results: Basic Metabolic Panel:  Recent Labs Lab 04/11/14 0515 04/12/14 0424  NA 137 137  K 3.3* 4.1  CL 108 104  CO2 25 23  GLUCOSE 108* 170*  BUN 37* 42*  CREATININE 2.57* 3.04*  CALCIUM 8.0* 8.0*  PHOS  --  5.1*   Liver Function Tests:  Recent Labs Lab 04/09/14 1523 04/12/14 0424  AST 42*  --   ALT 31  --   ALKPHOS 102  --   BILITOT 0.5  --   PROT 5.5*  --   ALBUMIN 1.8* 1.7*   No results for input(s): LIPASE, AMYLASE in the last 168 hours. No results for input(s): AMMONIA in the last 168 hours. CBC:  Recent Labs Lab 04/09/14 1523 04/10/14 0249  WBC 9.7 10.1  NEUTROABS 7.6  --   HGB 11.9* 11.0*  HCT 35.0* 32.4*  MCV 89.7 89.3  PLT 261 238   Cardiac Enzymes:  Recent Labs Lab 04/10/14 0938 04/10/14 1552 04/10/14 2002  TROPONINI 0.05* 0.05* 0.15*   BNP: Invalid input(s): POCBNP CBG:  Recent Labs Lab 04/11/14 1700 04/11/14 2221 04/12/14 0729 04/12/14 0952 04/12/14 1131  GLUCAP 140* 95 179* 206* 229*     Micro Results: Recent Results (from the past 240 hour(s))  Urine culture     Status: None   Collection Time: 04/10/14  6:15 AM  Result Value Ref Range Status   Specimen Description URINE, CLEAN CATCH  Final   Special Requests NONE  Final   Colony Count   Final    70,000 COLONIES/ML Performed at [facility]    Culture   Final    Multiple bacterial morphotypes present, none predominant. Suggest appropriate recollection if clinically indicated. Performed at [facility]     Report Status 04/11/2014 FINAL  Final    Studies/Results: Dg Chest 2 View  04/09/2014   CLINICAL DATA:  Weakness rete 3 days, leg swelling for few months, dry cough for 2 weeks, hypertension, diabetes  EXAM: CHEST  2 VIEW  COMPARISON:  08/18/2013  FINDINGS: Enlargement of cardiac silhouette.  Mediastinal contours and pulmonary vascularity normal.  Lungs clear.  Tiny LEFT pleural effusion.  No pneumothorax.  Bones unremarkable.  IMPRESSION: Enlargement of cardiac silhouette.  Tiny LEFT pleural effusion.   Electronically Signed   By: Lavonia Dana M.D.   On: 04/09/2014 17:25   Mr Brain Wo Contrast  04/12/2014   CLINICAL DATA:  69 year old male with left side weakness. Uncontrolled hypertension and diabetes. Worsening extremity swelling. Initial encounter.  EXAM: MRI HEAD WITHOUT CONTRAST  TECHNIQUE: Multiplanar, multiecho pulse sequences of the brain and surrounding structures were obtained without intravenous contrast.  COMPARISON:  Brain MRI 02/02/2007.  Cerebral angiogram 02/04/2007.  FINDINGS: Confluent restricted diffusion tracking from the right corona radiata into the deep gray matter nuclei as well as the right temporal stem. The mesial right temporal lobe is spared.  There is some involvement of the right thalamus.  Associated T2 and FLAIR hyperintensity. No associated hemorrhage or significant mass effect.  Major intracranial vascular flow voids are stable.  No contralateral or posterior fossa restricted diffusion. Cerebral volume has decreased since 2008. Mild ex vacuo ventricle enlargement has occurred. Mildly progressed and widespread Patchy and confluent cerebral white matter T2 and FLAIR hyperintensity. Occasional small areas of cortical encephalomalacia in both MCA territories. Progressed chronic T2 abnormality in the deep gray matter nuclei, especially the left thalamus. Increased T2 abnormality in the central on left pons. Cerebellum remains within normal limits.  No midline shift, mass effect,  or evidence of intracranial mass lesion. No acute intracranial hemorrhage identified. Negative pituitary and cervicomedullary junction. Partially visible cervical spine degeneration. Bone marrow signal within normal limits. Visible internal auditory structures appear normal. Mastoids are clear. Mild paranasal sinus mucosal thickening is similar to the prior exam. Visualized orbit soft tissues are within normal limits. Visualized scalp soft tissues are within normal limits.  IMPRESSION: 1. Patchy acute infarct in the right hemisphere affecting the corona radiata and deep gray matter nuclei. No associated hemorrhage or mass effect. 2. 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   Electronically Signed   By: Lars Pinks M.D.   On: 04/12/2014 09:26   US Renal  04/10/2014   CLINICAL DATA:  Acute renal failure.  Initial encounter.  EXAM: RENAL/URINARY TRACT ULTRASOUND COMPLETE  COMPARISON:  None.  FINDINGS: Right Kidney:  Length: 10.3 cm. Increased renal parenchymal echogenicity is noted. Mild right-sided perinephric fluid is noted. No mass or hydronephrosis visualized.  Left Kidney:  Length: 11.9 cm. Mildly increased renal parenchymal echogenicity is noted. Mild left-sided perinephric fluid is seen. No mass or hydronephrosis visualized.  Bladder:  Minimal apparent echoes near the anterior bladder wall may be artifactual in nature. The bladder is otherwise unremarkable. Bilateral ureteral jets are visualized.  IMPRESSION: 1. Increased renal cortical echogenicity raises concern for medical renal disease. 2. No evidence of hydronephrosis.  Bilateral ureteral jets are seen. 3. Mild bilateral perinephric fluid noted, nonspecific in nature.   Electronically Signed   By: Garald Balding M.D.   On: 04/10/2014 04:31    Medications: Scheduled Meds: . amLODipine  10 mg Oral Daily  . aspirin  325 mg Oral Daily  . enoxaparin (LOVENOX) injection  30 mg Subcutaneous Q24H  . furosemide  160 mg  Oral BID  . insulin aspart  0-5 Units Subcutaneous QHS  . insulin aspart  0-9 Units Subcutaneous TID WC  . insulin aspart  3 Units Subcutaneous TID WC  . labetalol  100 mg Oral BID  . simvastatin  20 mg Oral q1800  . sodium chloride  3 mL Intravenous Q12H  . tamsulosin  0.4 mg Oral Daily      LOS: 3 days   Robben Jagiello M.D. [facility] 04/12/2014, 2:22 PM Pager: [phone]  If 7PM-7AM, please contact night-coverage [email or web address] Password [email or web address]    I have personally examined the patient and reviewed the entire database. Agree with the above note and plan as outlined, any necessary changes made in italics. Continue diuresis per renal recommendations. Patient noted to have left lower extremity weakness, stat MRI positive for acute right sided CVA. Neurology consulted, d/w Dr Doy Mince, stroke workup initiated. Discussed with family at the bedside.  Chong January M.D. [facility] 04/12/2014, 2:22 PM Pager: [phone]  If 7PM-7AM, please contact night-coverage [email or web address] Password [email or web address]

## 2014-04-12 NOTE — Evaluation (Signed)
Clinical/Bedside Swallow Evaluation Patient Details  Name: [name] MRN: [id number] Date of Birth: [birth year]  Today's Date: 04/12/2014 Time: 4098-1191 SLP Time Calculation (min) (ACUTE ONLY): 23 min  Past Medical History:  Past Medical History  Diagnosis Date  . Hypertension   . Hypercholesterolemia   . Diabetes mellitus without complication   . Glaucoma   . Stroke     a. 01/2007 L corona radiata infarct.  . Cerebrovascular disease     a. 01/2007 MRI/A: L MCA 47-82%, R MCA 95%, LICA 62%.  . CKD (chronic kidney disease), stage III   . Chronic combined systolic and diastolic CHF, NYHA class 2     a. 01/2007 Echo: EF 60-65%;  b. 03/2014 Echo: EF 45-50%, Gr 1 DD, severe LVH.  Marland Kitchen Headache    Past Surgical History:  Past Surgical History  Procedure Laterality Date  . Colonoscopy w/ biopsies and polypectomy    . Colon surgery    . Trabeculectomy Left 08/18/2013    Procedure: TRABECULECTOMY WITH TUBE WITH [facility];  Surgeon: [name], MD;  Location: [place];  Service: Ophthalmology;  Laterality: Left;   HPI:  69 year old male with uncontrolled hypertension, diabetes mellitus, prior history of CVA 2008, chronic kidney disease, has not seen any nephrologist presented to ED with worsening swelling of his lower extremities, abdominal distention, overall generalized weakness. Dx acute renal failure; new symptoms left sided weakness 1/19 MRI confirming acute right CVA (corona radiata, deep gray matter nuclei). Now NPO - prior diet renal/ carbohydrate modified with 1200 ml fluids   Assessment / Plan / Recommendation Clinical Impression  Pt lethargic after acute infarct right corona radiata, deep gray matter nuclei, some involvement right thalamus.  Presents with s/s of dysphagia with concerns for aspiration given potential for sensory loss - adequate mastication noted; likely delayed swallow; delayed cough after consumption of thin liquids.  Recommend dysphagia 3 diet with nectar-thick  liquids tonight and MBS next date.  Pt/wife in agreement.  SLP will follow.  Please order OT evaluation given LUE and visual deficits - thanks.     Aspiration Risk  Moderate    Diet Recommendation Dysphagia 3 (Mechanical Soft);Nectar-thick liquid   Liquid Administration via: Cup Medication Administration: Whole meds with puree Supervision: Patient able to self feed;Full supervision/cueing for compensatory strategies Compensations: Slow rate;Small sips/bites Postural Changes and/or Swallow Maneuvers: Seated upright 90 degrees    Other  Recommendations Recommended Consults: MBS Oral Care Recommendations: Oral care BID Other Recommendations: Order thickener from pharmacy   Follow Up Recommendations   (tba)    Frequency and Duration   tba pending MBS         SLP Swallow Goals     Swallow Study Prior Functional Status       General Date of Onset: 04/12/14 HPI: 69 year old male with uncontrolled hypertension, diabetes mellitus, prior history of CVA 2008, chronic kidney disease, has not seen any nephrologist presented to ED with worsening swelling of his lower extremities, abdominal distention, overall generalized weakness. Dx acute renal failure; new symptoms left sided weakness 1/19 MRI confirming acute right CVA (corona radiata, deep gray matter nuclei). Now NPO - prior diet renal/ carbohydrate modified with 1200 ml fluids Type of Study: Bedside swallow evaluation Previous Swallow Assessment: none per records Diet Prior to this Study: NPO Temperature Spikes Noted: No Respiratory Status: Room air History of Recent Intubation: No Behavior/Cognition: Lethargic Oral Cavity - Dentition: Adequate natural dentition Self-Feeding Abilities: Needs assist;Able to feed self Patient Positioning: Upright in bed Baseline Vocal  Quality: Wet;Low vocal intensity Volitional Cough: Strong Volitional Swallow: Able to elicit    Oral/Motor/Sensory Function Overall Oral Motor/Sensory Function:  Appears within functional limits for tasks assessed   Ice Chips Ice chips: Not tested   Thin Liquid Thin Liquid: Impaired Presentation: Cup;Straw Oral Phase Functional Implications: Left anterior spillage Pharyngeal  Phase Impairments: Suspected delayed Swallow;Wet Vocal Quality;Cough - Delayed    Nectar Thick Nectar Thick Liquid: Within functional limits Presentation: Cup   Honey Thick Honey Thick Liquid: Not tested   Puree Puree: Within functional limits   Solid     Solid: Within functional limits      Sahira Cataldi L. Tivis Ringer, [place] CCC/SLP Pager [phone]  Juan Quam Laurice 04/12/2014,2:03 PM

## 2014-04-12 NOTE — Progress Notes (Signed)
PT Cancellation Note  Patient Details Name: [name] MRN: [id number] DOB: Jul 31, 1945   Cancelled Treatment:    Reason Eval/Treat Not Completed: Patient unavailable--Nursing attempting to administer medications. Will return this evening or tomorrow morning as schedule permits.   [name] 04/12/2014, 5:24 PM Pager [phone]

## 2014-04-12 NOTE — Evaluation (Signed)
Physical Therapy Evaluation Patient Details Name: [name] MRN: [id number] DOB: 10-22-1945 Today's Date: 04/12/2014   History of Present Illness  Adm 04/09/14 with h/o gradual progressive weakness and incr edema; +renal failure. 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 Patient currently with severe decline in functional mobility with significant impairments, however cognitively able to follow commands and very motivated. Pt currently with functional limitations due to the deficits listed below (see PT Problem List). Pt will benefit from skilled PT to increase their independence and safety with mobility to allow discharge to the venue listed below.       Follow Up Recommendations CIR;Supervision/Assistance - 24 hour    Equipment Recommendations   (TBA)    Recommendations for Other Services OT consult     Precautions / Restrictions Precautions Precautions: Fall Precaution Comments: Lt shoulder at risk for subluxation Required Braces or Orthoses: Other Brace/Splint Other Brace/Splint: wears Rt AFO (post L CVA '08)      Mobility  Bed Mobility Overal bed mobility: Needs Assistance Bed Mobility: Rolling Rolling: Total assist         General bed mobility comments: roll Lt with max assist partially due to Lt visual field cut and difficulty finding Lt side/rail/etc; roll Rt total assist with RUE using Rt rail  Transfers                    Ambulation/Gait                Stairs            Wheelchair Mobility    Modified Rankin (Stroke Patients Only) Modified Rankin (Stroke Patients Only) Pre-Morbid Rankin Score: No significant disability ([facility] Modified Rankin: Severe disability     Balance                                              Pertinent Vitals/Pain Pain Assessment: No/denies pain    Home Living Family/patient expects to be discharged to:: Private residence Living Arrangements: Spouse/significant other;Children                    Prior Function Level of Independence: Independent with assistive device(s)         Comments: per daughter was walking with cane and Rt AFO      Hand Dominance        Extremity/Trunk Assessment   Upper Extremity Assessment: Defer to OT evaluation (Rt AROM, grossly 3+ to 4; Lt grossly 2/5)           Lower Extremity Assessment: RLE deficits/detail;LLE deficits/detail RLE Deficits / Details: incr extensor/plantarflexion tone noted; after PROM, pt able to activate hip extensors and knee extensors 3-/5; hip flexion, ankle PF/DF 0/5 LLE Deficits / Details: PROM WFL; hip flexion 0/5, extension 1+; knee extension 2/5; ankle 0/5  Cervical / Trunk Assessment: Other exceptions  Communication   Communication: No difficulties  Cognition Arousal/Alertness: Awake/alert Behavior During Therapy: WFL for tasks assessed/performed (? slightly labile) Overall Cognitive Status: Within Functional Limits for tasks assessed                      General Comments  General comments (skin integrity, edema, etc.): Daughter present    Exercises        Assessment/Plan    PT Assessment Patient needs continued PT services  PT Diagnosis Hemiplegia non-dominant side   PT Problem List Decreased strength;Decreased activity tolerance;Decreased balance;Decreased mobility;Decreased knowledge of use of DME;Impaired sensation;Impaired tone  PT Treatment Interventions DME instruction;Functional mobility training;Therapeutic activities;Balance training;Neuromuscular re-education;Patient/family education   PT Goals (Current goals can be found in the Care Plan section) Acute Rehab PT Goals Patient Stated Goal: agrees he wants to get stronger PT Goal Formulation: With patient Time  For Goal Achievement: 04/26/14 Potential to Achieve Goals: Good    Frequency Min 4X/week   Barriers to discharge        Co-evaluation               End of Session   Activity Tolerance: Patient limited by fatigue Patient left: in bed;with call bell/phone within reach;with family/visitor present           Time: 1722-1759 PT Time Calculation (min) (ACUTE ONLY): 37 min   Charges:   PT Evaluation $Initial PT Evaluation Tier I: 1 Procedure PT Treatments $Therapeutic Activity: 8-22 mins   PT G Codes:        [name] 04/26/14, 6:24 PM Pager [phone]

## 2014-04-13 ENCOUNTER — Inpatient Hospital Stay (HOSPITAL_COMMUNITY): Payer: Commercial Managed Care - HMO

## 2014-04-13 DIAGNOSIS — I69398 Other sequelae of cerebral infarction: Secondary | ICD-10-CM

## 2014-04-13 DIAGNOSIS — I420 Dilated cardiomyopathy: Secondary | ICD-10-CM

## 2014-04-13 DIAGNOSIS — I679 Cerebrovascular disease, unspecified: Secondary | ICD-10-CM

## 2014-04-13 DIAGNOSIS — I10 Essential (primary) hypertension: Secondary | ICD-10-CM

## 2014-04-13 DIAGNOSIS — G8190 Hemiplegia, unspecified affecting unspecified side: Secondary | ICD-10-CM

## 2014-04-13 DIAGNOSIS — E1151 Type 2 diabetes mellitus with diabetic peripheral angiopathy without gangrene: Secondary | ICD-10-CM | POA: Insufficient documentation

## 2014-04-13 DIAGNOSIS — I639 Cerebral infarction, unspecified: Secondary | ICD-10-CM | POA: Diagnosis not present

## 2014-04-13 DIAGNOSIS — H547 Unspecified visual loss: Secondary | ICD-10-CM

## 2014-04-13 DIAGNOSIS — E1159 Type 2 diabetes mellitus with other circulatory complications: Secondary | ICD-10-CM

## 2014-04-13 LAB — RENAL FUNCTION PANEL
ALBUMIN: 1.5 g/dL — AB (ref 3.5–5.2)
ANION GAP: 11 (ref 5–15)
BUN: 52 mg/dL — ABNORMAL HIGH (ref 6–23)
CHLORIDE: 104 meq/L (ref 96–112)
CO2: 24 mmol/L (ref 19–32)
CREATININE: 3.06 mg/dL — AB (ref 0.50–1.35)
Calcium: 7.9 mg/dL — ABNORMAL LOW (ref 8.4–10.5)
GFR calc Af Amer: 23 mL/min — ABNORMAL LOW (ref >90)
GFR calc non Af Amer: 20 mL/min — ABNORMAL LOW (ref >90)
Glucose, Bld: 226 mg/dL — ABNORMAL HIGH (ref 70–99)
PHOSPHORUS: 4.6 mg/dL (ref 2.3–4.6)
POTASSIUM: 4.2 mmol/L (ref 3.5–5.1)
SODIUM: 139 mmol/L (ref 135–145)

## 2014-04-13 LAB — CBC
HCT: 29.2 % — ABNORMAL LOW (ref 39.0–52.0)
HEMOGLOBIN: 10 g/dL — AB (ref 13.0–17.0)
MCH: 30.7 pg (ref 26.0–34.0)
MCHC: 34.2 g/dL (ref 30.0–36.0)
MCV: 89.6 fL (ref 78.0–100.0)
Platelets: 240 10*3/uL (ref 150–400)
RBC: 3.26 MIL/uL — ABNORMAL LOW (ref 4.22–5.81)
RDW: 13 % (ref 11.5–15.5)
WBC: 8 10*3/uL (ref 4.0–10.5)

## 2014-04-13 LAB — PARATHYROID HORMONE, INTACT (NO CA): PTH: 40 pg/mL (ref 15–65)

## 2014-04-13 LAB — GLUCOSE, CAPILLARY
GLUCOSE-CAPILLARY: 233 mg/dL — AB (ref 70–99)
GLUCOSE-CAPILLARY: 133 mg/dL — AB (ref 70–99)
Glucose-Capillary: 202 mg/dL — ABNORMAL HIGH (ref 70–99)
Glucose-Capillary: 224 mg/dL — ABNORMAL HIGH (ref 70–99)

## 2014-04-13 LAB — HEPATITIS C VRS RNA DETECT BY PCR-QUAL: Hepatitis C Vrs RNA by PCR-Qual: POSITIVE — AB

## 2014-04-13 MED ORDER — CLOPIDOGREL BISULFATE 75 MG PO TABS
75.0000 mg | ORAL_TABLET | Freq: Every day | ORAL | Status: DC
  Administered 2014-04-14 – 2014-04-18 (×5): 75 mg via ORAL
  Filled 2014-04-13 (×5): qty 1

## 2014-04-13 MED ORDER — INSULIN GLARGINE 100 UNIT/ML ~~LOC~~ SOLN
5.0000 [IU] | Freq: Every day | SUBCUTANEOUS | Status: DC
  Administered 2014-04-13 – 2014-04-17 (×4): 5 [IU] via SUBCUTANEOUS
  Filled 2014-04-13 (×6): qty 0.05

## 2014-04-13 MED ORDER — CARVEDILOL 12.5 MG PO TABS
12.5000 mg | ORAL_TABLET | Freq: Two times a day (BID) | ORAL | Status: DC
  Administered 2014-04-13 – 2014-04-18 (×11): 12.5 mg via ORAL
  Filled 2014-04-13 (×13): qty 1

## 2014-04-13 MED ORDER — POLYETHYLENE GLYCOL 3350 17 G PO PACK
17.0000 g | PACK | Freq: Every day | ORAL | Status: DC
  Administered 2014-04-13 – 2014-04-17 (×5): 17 g via ORAL
  Filled 2014-04-13 (×8): qty 1

## 2014-04-13 MED ORDER — ATORVASTATIN CALCIUM 80 MG PO TABS
80.0000 mg | ORAL_TABLET | Freq: Every day | ORAL | Status: DC
  Administered 2014-04-13 – 2014-04-18 (×6): 80 mg via ORAL
  Filled 2014-04-13 (×6): qty 1

## 2014-04-13 NOTE — Progress Notes (Signed)
Bilateral carotid artery duplex completed:  1-39% ICA stenosis.  Vertebral artery flow is antegrade.     

## 2014-04-13 NOTE — Progress Notes (Signed)
Rehab Admissions Coordinator Note:  Patient was screened by Retta Diones for appropriateness for an Inpatient Acute Rehab Consult.  At this time, we are recommending Inpatient Rehab consult.  Retta Diones 04/13/2014, 8:39 AM  I can be reached at [phone].

## 2014-04-13 NOTE — Procedures (Signed)
Objective Swallowing Evaluation: Modified Barium Swallowing Study  Patient Details  Name: [name] MRN: [id number] Date of Birth: 08/11/1945  Today's Date: 04/13/2014 Time: SLP Start Time (ACUTE ONLY): 0830-SLP Stop Time (ACUTE ONLY): 0850 SLP Time Calculation (min) (ACUTE ONLY): 20 min  Past Medical History:  Past Medical History  Diagnosis Date  . Hypertension   . Hypercholesterolemia   . Diabetes mellitus without complication   . Glaucoma   . Stroke     a. 01/2007 L corona radiata infarct.  . Cerebrovascular disease     a. 01/2007 MRI/A: L MCA 56-31%, R MCA 49%, LICA 70%.  . CKD (chronic kidney disease), stage III   . Chronic combined systolic and diastolic CHF, NYHA class 2     a. 01/2007 Echo: EF 60-65%;  b. 03/2014 Echo: EF 45-50%, Gr 1 DD, severe LVH.  Marland Kitchen Headache    Past Surgical History:  Past Surgical History  Procedure Laterality Date  . Colonoscopy w/ biopsies and polypectomy    . Colon surgery    . Trabeculectomy Left 08/18/2013    Procedure: TRABECULECTOMY WITH TUBE WITH [facility];  Surgeon: [name], MD;  Location: [place];  Service: Ophthalmology;  Laterality: Left;   HPI:  HPI: 69 year old male with uncontrolled hypertension, diabetes mellitus, prior history of CVA 2008, chronic kidney disease, has not seen any nephrologist presented to ED with worsening swelling of his lower extremities, abdominal distention, overall generalized weakness. Dx acute renal failure; new symptoms left sided weakness 1/19 MRI confirming acute right CVA (corona radiata, deep gray matter nuclei). Now NPO - prior diet renal/ carbohydrate modified with 1200 ml fluids .  Bedside swallow eval 1/19 with concerns for aspiration.  MBS ordered.  No Data Recorded  Assessment / Plan / Recommendation CHL IP CLINICAL IMPRESSIONS 04/13/2014  Dysphagia Diagnosis Mild oral phase dysphagia;Mild pharyngeal phase dysphagia  Clinical impression Pt presents with a mild oropharyngeal dysphagia marked  by decreased oral coordination for mastication, oral pocketing on left, and premature spillage of thin/nectar liquids to pyriforms prior to swallow initiation.  However, there was adequate airway protection with no penetration nor aspiration, despite taxing swallowing mechanism with large, successive thin liquid boluses.  Recommend resuming regular diet with thin liquids; give meds whole in puree.  SLP will f/u briefly for functional toleration during a meal to address inattention, oral pocketing.       CHL IP TREATMENT RECOMMENDATION 04/13/2014  Treatment Plan Recommendations Therapy as outlined in treatment plan below     CHL IP DIET RECOMMENDATION 04/13/2014  Diet Recommendations Regular;Thin liquid  Liquid Administration via Cup;Straw  Medication Administration Whole meds with puree  Compensations Slow rate;Small sips/bites;Check for pocketing  Postural Changes and/or Swallow Maneuvers Seated upright 90 degrees     CHL IP OTHER RECOMMENDATIONS 04/13/2014  Recommended Consults (None)  Oral Care Recommendations Oral care BID  Other Recommendations (None)     CHL IP FOLLOW UP RECOMMENDATIONS 04/13/2014  Follow up Recommendations Inpatient Rehab     CHL IP FREQUENCY AND DURATION 04/13/2014  Speech Therapy Frequency (ACUTE ONLY) min 1 x/week  Treatment Duration 1 week     SLP Swallow Goals No flowsheet data found.  No flowsheet data found.    CHL IP REASON FOR REFERRAL 04/13/2014  Reason for Referral Objectively evaluate swallowing function     CHL IP ORAL PHASE 04/13/2014  Lips (None)  Tongue (None)  Mucous membranes (None)  Nutritional status (None)  Other (None)  Oxygen therapy (None)  Oral Phase Impaired  Oral - Pudding Teaspoon (None)  Oral - Pudding Cup (None)  Oral - Honey Teaspoon (None)  Oral - Honey Cup (None)  Oral - Honey Syringe (None)  Oral - Nectar Teaspoon (None)  Oral - Nectar Cup (None)  Oral - Nectar Straw (None)  Oral - Nectar Syringe (None)  Oral - Ice  Chips (None)  Oral - Thin Teaspoon (None)  Oral - Thin Cup (None)  Oral - Thin Straw (None)  Oral - Thin Syringe (None)  Oral - Puree (None)  Oral - Mechanical Soft (None)  Oral - Regular (No Data)  Oral - Multi-consistency (None)  Oral - Pill (None)  Oral Phase - Comment (None)      CHL IP PHARYNGEAL PHASE 04/13/2014  Pharyngeal Phase Impaired  Pharyngeal - Pudding Teaspoon (None)  Penetration/Aspiration details (pudding teaspoon) (None)  Pharyngeal - Pudding Cup (None)  Penetration/Aspiration details (pudding cup) (None)  Pharyngeal - Honey Teaspoon (None)  Penetration/Aspiration details (honey teaspoon) (None)  Pharyngeal - Honey Cup (None)  Penetration/Aspiration details (honey cup) (None)  Pharyngeal - Honey Syringe (None)  Penetration/Aspiration details (honey syringe) (None)  Pharyngeal - Nectar Teaspoon (None)  Penetration/Aspiration details (nectar teaspoon) (None)  Pharyngeal - Nectar Cup Premature spillage to pyriform sinuses  Penetration/Aspiration details (nectar cup) (None)  Pharyngeal - Nectar Straw (None)  Penetration/Aspiration details (nectar straw) (None)  Pharyngeal - Nectar Syringe (None)  Penetration/Aspiration details (nectar syringe) (None)  Pharyngeal - Ice Chips (None)  Penetration/Aspiration details (ice chips) (None)  Pharyngeal - Thin Teaspoon (None)  Penetration/Aspiration details (thin teaspoon) (None)  Pharyngeal - Thin Cup Premature spillage to pyriform sinuses  Penetration/Aspiration details (thin cup) (None)  Pharyngeal - Thin Straw (None)  Penetration/Aspiration details (thin straw) (None)  Pharyngeal - Thin Syringe (None)  Penetration/Aspiration details (thin syringe') (None)  Pharyngeal - Puree (None)  Penetration/Aspiration details (puree) (None)  Pharyngeal - Mechanical Soft (None)  Penetration/Aspiration details (mechanical soft) (None)  Pharyngeal - Regular (None)  Penetration/Aspiration details (regular) (None)  Pharyngeal -  Multi-consistency (None)  Penetration/Aspiration details (multi-consistency) (None)  Pharyngeal - Pill (None)  Penetration/Aspiration details (pill) (None)  Pharyngeal Comment (None)     CHL IP CERVICAL ESOPHAGEAL PHASE 04/13/2014  Cervical Esophageal Phase WFL  Pudding Teaspoon (None)  Pudding Cup (None)  Honey Teaspoon (None)  Honey Cup (None)  Honey Syringe (None)  Nectar Teaspoon (None)  Nectar Cup (None)  Nectar Straw (None)  Nectar Syringe (None)  Thin Teaspoon (None)  Thin Cup (None)  Thin Straw (None)  Thin Syringe (None)  Cervical Esophageal Comment (None)    No flowsheet data found.  Lakeyta Vandenheuvel L. Tivis Ringer, [place] CCC/SLP Pager [phone]        Juan Quam Laurice 04/13/2014, 9:02 AM

## 2014-04-13 NOTE — Progress Notes (Signed)
Physical Therapy Treatment Patient Details Name: [name] MRN: [id number] DOB: Oct 07, 1945 Today's Date: 04/13/2014    History of Present Illness Adm 04/09/14 with h/o gradual progressive weakness and incr edema; +renal failure. 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 Was able to transition to EOB with +2 total assist, however sitting tolerance was limited by extensor tone in the trunk and LE's, which increased risk of sliding off EOB. Noted that tone was increased in the RLE vs the LLE. Pt appeared to be more comfortable in a elevated position for his trunk, as had multiple coughing fits while laying down. Did not cough at all while sitting EOB, and pt was positioned at end of session off R side and with trunk elevated >30. Will continue to follow.   Follow Up Recommendations  CIR;Supervision/Assistance - 24 hour     Equipment Recommendations  Wheelchair (measurements PT);Wheelchair cushion (measurements PT);3in1 (PT);Other (comment) Chief Operating Officer)    Recommendations for Other Services       Precautions / Restrictions Precautions Precautions: Fall Precaution Comments: Lt shoulder at risk for subluxation Required Braces or Orthoses: Other Brace/Splint Other Brace/Splint: wears Rt AFO (post L CVA '08) Restrictions Weight Bearing Restrictions: No    Mobility  Bed Mobility Overal bed mobility: Needs Assistance Bed Mobility: Rolling;Supine to Sit;Sit to Supine Rolling: Total assist   Supine to sit: Total assist;+2 for physical assistance;HOB elevated Sit to supine: Total assist;+2 for physical assistance   General bed mobility comments: roll Lt with max assist partially due to Lt visual field cut and difficulty finding Lt side/rail/etc; roll Rt total assist with RUE using Rt rail. Pt was able to  transition to/from EOB with helicopter technique. While sitting pt demonstrated increased extensor tone bilaterally (R>L). Performed transition to/from EOB x2.   Transfers                 General transfer comment: Did not progress mobility at this time due to pt fatigue and general safety.   Ambulation/Gait                 Stairs            Wheelchair Mobility    Modified Rankin (Stroke Patients Only) Modified Rankin (Stroke Patients Only) Pre-Morbid Rankin Score: No significant disability ([facility] Modified Rankin: Severe disability     Balance Overall balance assessment: Needs assistance Sitting-balance support: Feet supported;Single extremity supported Sitting balance-[name]: Zero Sitting balance - Comments: Max assist for trunk control, and noted extensor tone bilaterally (R>L) Postural control: Posterior lean                          Cognition Arousal/Alertness: Awake/alert Behavior During Therapy: WFL for tasks assessed/performed Overall Cognitive Status: Impaired/Different from baseline (Simultaneous filing. User may not have seen previous data.) Area of Impairment: Attention;Following commands;Awareness;Problem solving   Current Attention Level: Selective   Following Commands: Follows one step commands inconsistently;Follows one step commands with increased time   Awareness: Emergent Problem Solving: Slow processing;Decreased initiation;Difficulty sequencing;Requires verbal cues;Requires tactile cues General Comments: Pt demonstrated problem solving skills with picking up the call bell and pressing the nurse's button.     Exercises      General Comments General comments (skin integrity,  edema, etc.): Daughter present.       Pertinent Vitals/Pain Pain Assessment: No/denies pain    Home Living Family/patient expects to be discharged to:: Private residence Living Arrangements: Spouse/significant  other Available Help at Discharge: Family;Available PRN/intermittently Type of Home: House Home Access: Level entry Entrance Stairs-Rails: Left Home Layout: Two level Home Equipment: Walker - 2 wheels;Cane - quad;Shower seat;Other (comment) (AFO for R LE)      Prior Function Level of Independence: Independent with assistive device(s)      Comments: per daughter was walking with cane and Rt AFO    PT Goals (current goals can now be found in the care plan section) Acute Rehab PT Goals Patient Stated Goal: agrees he wants to get stronger PT Goal Formulation: With patient Time For Goal Achievement: 04/26/14 Potential to Achieve Goals: Good Progress towards PT goals: Progressing toward goals    Frequency  Min 4X/week    PT Plan Current plan remains appropriate    Co-evaluation             End of Session   Activity Tolerance: Patient limited by fatigue Patient left: in bed;with call bell/phone within reach;with family/visitor present     Time: 1129-1201 PT Time Calculation (min) (ACUTE ONLY): 32 min  Charges:  $Therapeutic Activity: 8-22 mins                    G Codes:      Rolinda Roan 2014/04/19, 1:47 PM   Rolinda Roan, PT, DPT Acute Rehabilitation Services Pager: [phone]

## 2014-04-13 NOTE — Progress Notes (Signed)
DAILY PROGRESS NOTE  Subjective:  Coughing  - going for a swallow evaluation. No chest pain or dyspnea.  Objective:  Temp:  [97.7 F (36.5 C)-99 F (37.2 C)] 99 F (37.2 C) (01/20 0634) Pulse Rate:  [76-87] 76 (01/20 0634) Resp:  [16-17] 16 (01/20 0634) BP: (128-164)/(58-81) 128/58 mmHg (01/20 0634) SpO2:  [96 %-100 %] 96 % (01/20 0634) Weight change:   Intake/Output from previous day: 01/19 0701 - 01/20 0700 In: 626 [P.O.:626] Out: 1600 [Urine:1600]  Intake/Output from this shift:    Medications: Current Facility-Administered Medications  Medication Dose Route Frequency Provider Last Rate Last Dose  . acetaminophen (TYLENOL) tablet 650 mg  650 mg Oral Q6H PRN Ripudeep Krystal Eaton, MD       Or  . acetaminophen (TYLENOL) suppository 650 mg  650 mg Rectal Q6H PRN Ripudeep K Rai, MD      . alum & mag hydroxide-simeth (MAALOX/MYLANTA) 200-200-20 MG/5ML suspension 30 mL  30 mL Oral Q6H PRN Ripudeep K Rai, MD      . amLODipine (NORVASC) tablet 10 mg  10 mg Oral Daily Sol Blazing, MD   10 mg at 04/12/14 1049  . aspirin tablet 325 mg  325 mg Oral Daily Ripudeep K Rai, MD   325 mg at 04/12/14 1049  . dextromethorphan (DELSYM) 30 MG/5ML liquid 15 mg  15 mg Oral BID PRN Lacy Duverney, PA-C   15 mg at 04/12/14 1815  . enoxaparin (LOVENOX) injection 30 mg  30 mg Subcutaneous Q24H Ripudeep K Rai, MD   30 mg at 04/12/14 2121  . food thickener (THICK IT) powder   Oral PRN Ripudeep K Rai, MD      . furosemide (LASIX) tablet 160 mg  160 mg Oral BID Jamal Maes, MD   160 mg at 04/12/14 1730  . hydrALAZINE (APRESOLINE) injection 10 mg  10 mg Intravenous Q6H PRN Ripudeep Krystal Eaton, MD   10 mg at 04/09/14 1815  . HYDROcodone-acetaminophen (NORCO/VICODIN) 5-325 MG per tablet 1 tablet  1 tablet Oral Q4H PRN Ripudeep Krystal Eaton, MD   1 tablet at 04/10/14 1717  . HYDROmorphone (DILAUDID) injection 1 mg  1 mg Intravenous Q4H PRN Ripudeep K Rai, MD      . insulin aspart (novoLOG) injection 0-5 Units  0-5  Units Subcutaneous QHS Ripudeep Krystal Eaton, MD   3 Units at 04/12/14 2125  . insulin aspart (novoLOG) injection 0-9 Units  0-9 Units Subcutaneous TID WC Ripudeep Krystal Eaton, MD   2 Units at 04/12/14 1718  . insulin aspart (novoLOG) injection 3 Units  3 Units Subcutaneous TID WC Ripudeep Krystal Eaton, MD   3 Units at 04/12/14 1717  . labetalol (NORMODYNE) tablet 100 mg  100 mg Oral BID Sol Blazing, MD   100 mg at 04/12/14 2209  . ondansetron (ZOFRAN) tablet 4 mg  4 mg Oral Q6H PRN Ripudeep K Rai, MD       Or  . ondansetron (ZOFRAN) injection 4 mg  4 mg Intravenous Q6H PRN Ripudeep K Rai, MD      . simvastatin (ZOCOR) tablet 20 mg  20 mg Oral q1800 Ripudeep K Rai, MD   20 mg at 04/12/14 1724  . sodium chloride 0.9 % injection 3 mL  3 mL Intravenous Q12H Ripudeep Krystal Eaton, MD   3 mL at 04/12/14 2124  . tamsulosin (FLOMAX) capsule 0.4 mg  0.4 mg Oral Daily Ripudeep K Rai, MD   0.4 mg at 04/12/14 1054  Physical Exam: General appearance: alert and no distress Lungs: clear to auscultation bilaterally Heart: regular rate and rhythm, S1, S2 normal, no murmur, click, rub or gallop Extremities: extremities normal, atraumatic, no cyanosis or edema  Lab Results: Results for orders placed or performed during the hospital encounter of 04/09/14 (from the past 48 hour(s))  Glucose, capillary     Status: Abnormal   Collection Time: 04/11/14 11:50 AM  Result Value Ref Range   Glucose-Capillary 126 (H) 70 - 99 mg/dL  Glucose, capillary     Status: Abnormal   Collection Time: 04/11/14  5:00 PM  Result Value Ref Range   Glucose-Capillary 140 (H) 70 - 99 mg/dL  Glucose, capillary     Status: None   Collection Time: 04/11/14 10:21 PM  Result Value Ref Range   Glucose-Capillary 95 70 - 99 mg/dL  Renal function panel     Status: Abnormal   Collection Time: 04/12/14  4:24 AM  Result Value Ref Range   Sodium 137 135 - 145 mmol/L    Comment: Please note change in reference range.   Potassium 4.1 3.5 - 5.1 mmol/L     Comment: Please note change in reference range.   Chloride 104 96 - 112 mEq/L   CO2 23 19 - 32 mmol/L   Glucose, Bld 170 (H) 70 - 99 mg/dL   BUN 42 (H) 6 - 23 mg/dL   Creatinine, Ser 3.04 (H) 0.50 - 1.35 mg/dL   Calcium 8.0 (L) 8.4 - 10.5 mg/dL   Phosphorus 5.1 (H) 2.3 - 4.6 mg/dL   Albumin 1.7 (L) 3.5 - 5.2 g/dL   GFR calc non Af Amer 20 (L) >90 mL/min   GFR calc Af Amer 23 (L) >90 mL/min    Comment: (NOTE) The eGFR has been calculated using the CKD EPI equation. This calculation has not been validated in all clinical situations. eGFR's persistently <90 mL/min signify possible Chronic Kidney Disease.    Anion gap 10 5 - 15  Parathyroid hormone, intact (no Ca)     Status: None   Collection Time: 04/12/14  4:24 AM  Result Value Ref Range   PTH 40 15 - 65 pg/mL    Comment: (NOTE) Performed At: [facility] [place], [place] [id number] Lindon Romp MD KN:[id number]   Lipid panel     Status: Abnormal   Collection Time: 04/12/14  4:24 AM  Result Value Ref Range   Cholesterol 268 (H) 0 - 200 mg/dL   Triglycerides 124 <150 mg/dL   HDL 58 >39 mg/dL   Total CHOL/HDL Ratio 4.6 RATIO   VLDL 25 0 - 40 mg/dL   LDL Cholesterol 185 (H) 0 - 99 mg/dL    Comment:        Total Cholesterol/HDL:CHD Risk Coronary Heart Disease Risk Table                     Men   Women  1/2 Average Risk   3.4   3.3  Average Risk       5.0   4.4  2 X Average Risk   9.6   7.1  3 X Average Risk  23.4   11.0        Use the calculated Patient Ratio above and the CHD Risk Table to determine the patient's CHD Risk.        ATP III CLASSIFICATION (LDL):  <100     mg/dL   Optimal  100-129  mg/dL  Near or Above                    Optimal  130-159  mg/dL   Borderline  160-189  mg/dL   High  >190     mg/dL   Very High   Glucose, capillary     Status: Abnormal   Collection Time: 04/12/14  7:29 AM  Result Value Ref Range   Glucose-Capillary 179 (H) 70 - 99 mg/dL  Glucose, capillary      Status: Abnormal   Collection Time: 04/12/14  9:52 AM  Result Value Ref Range   Glucose-Capillary 206 (H) 70 - 99 mg/dL  Glucose, capillary     Status: Abnormal   Collection Time: 04/12/14 11:31 AM  Result Value Ref Range   Glucose-Capillary 229 (H) 70 - 99 mg/dL  Glucose, capillary     Status: Abnormal   Collection Time: 04/12/14  4:22 PM  Result Value Ref Range   Glucose-Capillary 158 (H) 70 - 99 mg/dL  Glucose, capillary     Status: Abnormal   Collection Time: 04/12/14  9:24 PM  Result Value Ref Range   Glucose-Capillary 276 (H) 70 - 99 mg/dL  CBC     Status: Abnormal   Collection Time: 04/13/14  5:53 AM  Result Value Ref Range   WBC 8.0 4.0 - 10.5 K/uL   RBC 3.26 (L) 4.22 - 5.81 MIL/uL   Hemoglobin 10.0 (L) 13.0 - 17.0 g/dL   HCT 29.2 (L) 39.0 - 52.0 %   MCV 89.6 78.0 - 100.0 fL   MCH 30.7 26.0 - 34.0 pg   MCHC 34.2 30.0 - 36.0 g/dL   RDW 13.0 11.5 - 15.5 %   Platelets 240 150 - 400 K/uL  Renal function panel     Status: Abnormal   Collection Time: 04/13/14  5:53 AM  Result Value Ref Range   Sodium 139 135 - 145 mmol/L    Comment: Please note change in reference range.   Potassium 4.2 3.5 - 5.1 mmol/L    Comment: Please note change in reference range.   Chloride 104 96 - 112 mEq/L   CO2 24 19 - 32 mmol/L   Glucose, Bld 226 (H) 70 - 99 mg/dL   BUN 52 (H) 6 - 23 mg/dL   Creatinine, Ser 3.06 (H) 0.50 - 1.35 mg/dL   Calcium 7.9 (L) 8.4 - 10.5 mg/dL   Phosphorus 4.6 2.3 - 4.6 mg/dL   Albumin 1.5 (L) 3.5 - 5.2 g/dL   GFR calc non Af Amer 20 (L) >90 mL/min   GFR calc Af Amer 23 (L) >90 mL/min    Comment: (NOTE) The eGFR has been calculated using the CKD EPI equation. This calculation has not been validated in all clinical situations. eGFR's persistently <90 mL/min signify possible Chronic Kidney Disease.    Anion gap 11 5 - 15  Glucose, capillary     Status: Abnormal   Collection Time: 04/13/14  7:26 AM  Result Value Ref Range   Glucose-Capillary 202 (H) 70 - 99  mg/dL    Imaging: Mr Brain Wo Contrast  04/12/2014   CLINICAL DATA:  68 year old male with left side weakness. Uncontrolled hypertension and diabetes. Worsening extremity swelling. Initial encounter.  EXAM: MRI HEAD WITHOUT CONTRAST  TECHNIQUE: Multiplanar, multiecho pulse sequences of the brain and surrounding structures were obtained without intravenous contrast.  COMPARISON:  Brain MRI 02/02/2007.  Cerebral angiogram 02/04/2007.  FINDINGS: Confluent restricted diffusion tracking from the right corona radiata  into the deep gray matter nuclei as well as the right temporal stem. The mesial right temporal lobe is spared. There is some involvement of the right thalamus.  Associated T2 and FLAIR hyperintensity. No associated hemorrhage or significant mass effect.  Major intracranial vascular flow voids are stable.  No contralateral or posterior fossa restricted diffusion. Cerebral volume has decreased since 2008. Mild ex vacuo ventricle enlargement has occurred. Mildly progressed and widespread Patchy and confluent cerebral white matter T2 and FLAIR hyperintensity. Occasional small areas of cortical encephalomalacia in both MCA territories. Progressed chronic T2 abnormality in the deep gray matter nuclei, especially the left thalamus. Increased T2 abnormality in the central on left pons. Cerebellum remains within normal limits.  No midline shift, mass effect, or evidence of intracranial mass lesion. No acute intracranial hemorrhage identified. Negative pituitary and cervicomedullary junction. Partially visible cervical spine degeneration. Bone marrow signal within normal limits. Visible internal auditory structures appear normal. Mastoids are clear. Mild paranasal sinus mucosal thickening is similar to the prior exam. Visualized orbit soft tissues are within normal limits. Visualized scalp soft tissues are within normal limits.  IMPRESSION: 1. Patchy acute infarct in the right hemisphere affecting the corona  radiata and deep gray matter nuclei. No associated hemorrhage or mass effect. 2. 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   Electronically Signed   By: Lars Pinks M.D.   On: 04/12/2014 09:26    Assessment:  1. Principal Problem: 2.   Acute renal failure 3. Active Problems: 4.   Hypertension 5.   Hypercholesterolemia 6.   Diabetes mellitus without complication 7.   Elevated troponin 8.   Anasarca 9.   Congestive dilated cardiomyopathy 10.   Cerebrovascular disease 11.   Plan:  1. Neuro reports a new left corona radiata stroke - work-up including carotid dopplers planned. EF 45-50%, no evidence for a-fib.  Now on aspirin with multiple coronary risk factors. Troponin was mildly elevated - could be consistent with renal failure or CNS event. Not a candidate for cath. Agree with aspirin for now. Not sure that TEE would add extra diagnostic utility to cause of stroke - not clear if this is an atheroembolic MRI pattern to suggest a cardiac source. BP well controlled now on amlodipine and labetalol. Could consider bidil if needed for additional hypertension. Would switch labetalol to carvedilol for CHF.  Time Spent Directly with Patient:  15 minutes  Length of Stay:  LOS: 4 days   Pixie Casino, MD, [facility] Attending Cardiologist [facility]  HILTY,Kenneth C 04/13/2014, 8:14 AM

## 2014-04-13 NOTE — Clinical Social Work Psychosocial (Addendum)
Clinical Social Work Department BRIEF PSYCHOSOCIAL ASSESSMENT 04/13/2014  Patient:  [name], [name]     Account Number:  [id number]     Admit date:  04/09/2014  Clinical Social Worker:  Frederico Hamman  Date/Time:  04/13/2014 08:47 AM  Referred by:  CSW  Date Referred:  04/12/2014 Referred for  Psychosocial assessment   Other Referral:   Interview type:  Family Other interview type:    PSYCHOSOCIAL DATA Living Status:  WIFE Admitted from facility:   Level of care:   Primary support name:  [place] Primary support relationship to patient:  SPOUSE Degree of support available:   Strong support. Wife's mobile number [phone].    CURRENT CONCERNS Current Concerns  Other - See comment   Other Concerns:   Financial concerns, husbands medical noncompliance, care for husband at home.    SOCIAL WORK ASSESSMENT / PLAN On 04/12/14, Mrs. Cordts requested to talk with CSW and conversation had outside of patient's room. Mrs. Schroeter gave history of a prior hospital stay, her husband having no insurance, the bill (over $63,000) and a lien eventually being taken out on their home due to non-payment. Wife described her husband as very private and does not talk with her about anything concrerning him, however the lien on their home is affecting he and she is very concerned about finances.    Mrs. Klemmer explained that her husband is medically non-compliant and does what he wants to do. She is aware that patient had a stroke this hospitilazation and that he may go on dialysis this admission. Wife works and indicated that she cannot do anymore that she is doing now and if he goes on dialysis someone would have to help him as she works full-time at a [employer]) from 11 pm to 93 am.    Mrs. Pulsifer reported that although they have a [employer] address, they live within the city limits and her husband does currently use [facility] for his medical  appointments. They have three adult daughters who live outside of the home. Wife explained that one of their daughters really wants to take over in matters concerning her father.    Mrs. Haughey realizes that her husband will need rehab and wants to be involved in that conversation with CSW and patient.   Assessment/plan status:  Psychosocial Support/Ongoing Assessment of Needs Other assessment/ plan:   Possible SNF placement for rehab at discharge.   Information/referral to community resources:   Wife provided with skilled facility list for [facility].    PATIENT'S/FAMILY'S RESPONSE TO PLAN OF CARE: Mrs. Piehl requested to talk with CSW. She presented as very exasperated with her husband.       Makaylie Dedeaux Givens, MSW, LCSW Licensed Clinical Social Worker [place] [phone]

## 2014-04-13 NOTE — Evaluation (Signed)
Occupational Therapy Evaluation Patient Details Name: [name] MRN: [id number] DOB: [birth year] Today's Date: 04/13/2014    History of Present Illness Adm 04/09/14 with h/o gradual progressive weakness and incr edema; +renal failure. 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  He used a quad cane and R AFO.  Pt had regained full use of his R UE since his CVA in 2008 per his daughter.  Pt presents with flexor tone in L UE and impaired sensation.  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  Pt requires +2 assist for all bed mobility and demonstrates extensor tone throughout his trunk and LEs when in supported sitting. Pt talked minimally this session.  Extra time, verbal and physical cues were required for direction following. Cognition needs further assessment. Will follow acutely.  Pt will need post acute rehab.    Follow Up Recommendations  CIR    Equipment Recommendations       Recommendations for Other Services       Precautions / Restrictions Precautions Precautions: Fall Precaution Comments: Lt shoulder at risk for subluxation Required Braces or Orthoses: Other Brace/Splint Other Brace/Splint: wears Rt AFO (post L CVA '08) Restrictions Weight Bearing Restrictions: No      Mobility Bed Mobility Overal bed mobility: Needs Assistance Bed Mobility: Rolling;Supine to Sit;Sit to Supine Rolling: Total assist   Supine to sit: Total assist;+2 for physical assistance;HOB elevated Sit to supine: Total assist;+2 for physical assistance   General bed mobility comments: roll Lt with max assist partially due to Lt visual field cut and difficulty finding Lt side/rail/etc; roll Rt total assist with RUE using Rt rail. Pt  was able to transition to/from EOB with helicopter technique. While sitting pt demonstrated increased extensor tone bilaterally (R>L). Performed transition to/from EOB x2.   Transfers                 General transfer comment: Did not progress mobility at this time due to pt fatigue and general safety.     Balance Overall balance assessment: Needs assistance Sitting-balance support: Feet supported;Single extremity supported Sitting balance-[name]: Zero Sitting balance - Comments: Max assist for trunk control, and noted extensor tone bilaterally (R>L) Postural control: Posterior lean                                  ADL Overall ADL's : Needs assistance/impaired Eating/Feeding: Maximal assistance;Sitting   Grooming: Maximal assistance;Bed level   Upper Body Bathing: Total assistance   Lower Body Bathing: Total assistance   Upper Body Dressing : Total assistance   Lower Body Dressing: Total assistance               Functional mobility during ADLs: +2 for physical assistance;Total assistance       Vision Eye Alignment: Within Functional Limits   Ocular Range of Motion: Restricted on the left;Restricted looking up Tracking/Visual Pursuits: Left eye does not track laterally             Perception     Praxis      Pertinent Vitals/Pain Pain Assessment: No/denies pain     Hand Dominance Right   Extremity/Trunk  Assessment Upper Extremity Assessment Upper Extremity Assessment: LUE deficits/detail LUE Deficits / Details: brunnstrom level 1, mild edema in hand LUE Sensation: decreased proprioception;decreased light touch   Lower Extremity Assessment Lower Extremity Assessment: Defer to PT evaluation   Cervical / Trunk Assessment Cervical / Trunk Assessment: Other exceptions Cervical / Trunk Exceptions: increased extensor tone in sitting   Communication Communication Communication: No difficulties (talks very little, this is not his  baseline per daughter)   Cognition Arousal/Alertness: Awake/alert Behavior During Therapy: WFL for tasks assessed/performed Overall Cognitive Status: Impaired/Different from baseline (Simultaneous filing. User may not have seen previous data.) Area of Impairment: Attention;Following commands;Awareness;Problem solving   Current Attention Level: Selective   Following Commands: Follows one step commands inconsistently;Follows one step commands with increased time   Awareness: Emergent Problem Solving: Slow processing;Decreased initiation;Difficulty sequencing;Requires verbal cues;Requires tactile cues General Comments: Pt demonstrated problem solving skills with picking up the call bell and pressing the nurse's button.    General Comments       Exercises       Shoulder Instructions      Home Living Family/patient expects to be discharged to:: Private residence Living Arrangements: Spouse/significant other Available Help at Discharge: Family;Available PRN/intermittently (wife works as a Marine scientist full time) Type of Home: House Home Access: Level entry Technical brewer of Steps: 14 Entrance Stairs-Rails: Left Home Layout: Two level Alternate Level Stairs-Number of Steps: 14 Alternate Level Stairs-Rails: Left Bathroom Shower/Tub: Teacher, early years/pre: Standard     Home Equipment: Environmental consultant - 2 wheels;Cane - quad;Shower seat;Other (comment) (AFO for R LE)          Prior Functioning/Environment Level of Independence: Independent with assistive device(s)        Comments: per daughter was walking with cane and Rt AFO     OT Diagnosis: Generalized weakness;Cognitive deficits;Hemiplegia non-dominant side;Disturbance of vision   OT Problem List: Decreased strength;Decreased activity tolerance;Impaired balance (sitting and/or standing);Impaired vision/perception;Decreased coordination;Decreased cognition;Decreased knowledge of precautions;Decreased knowledge of use  of DME or AE;Impaired sensation;Impaired tone;Impaired UE functional use;Increased edema   OT Treatment/Interventions: Self-care/ADL training;Neuromuscular education;Manual therapy;DME and/or AE instruction;Therapeutic activities;Cognitive remediation/compensation;Visual/perceptual remediation/compensation;Balance training;Patient/family education    OT Goals(Current goals can be found in the care plan section) Acute Rehab OT Goals Patient Stated Goal: agrees he wants to get stronger OT Goal Formulation: With patient Time For Goal Achievement: 04/27/14 Potential to Achieve Goals: Good ADL Goals Pt Will Perform Eating: with min assist;sitting;bed level Pt Will Perform Grooming: with min assist;sitting;bed level Pt/caregiver will Perform Home Exercise Program: Left upper extremity;With written HEP provided;Independently (edema managment, positioning, PROM) Additional ADL Goal #1: Pt will perform bed mobility  with +2  mod assist. Additional ADL Goal #2: Pt will sit EOB x 10 minutes with mod assist as a precursor to ADL. Additional ADL Goal #3: Pt will visually locate ADL and/or food items with minimal verbal cues.  OT Frequency: Min 3X/week   Barriers to D/C: Decreased caregiver support          Co-evaluation PT/OT/SLP Co-Evaluation/Treatment: Yes Reason for Co-Treatment: For patient/therapist safety   OT goals addressed during session: Strengthening/ROM      End of Session    Activity Tolerance: Patient limited by fatigue Patient left: in bed;with call bell/phone within reach;with family/visitor present   Time: 1128-1202 OT Time Calculation (min): 34 min Charges:  OT General Charges $OT Visit: 1 Procedure OT Evaluation $Initial OT Evaluation Tier I: 1 Procedure OT Treatments $Neuromuscular Re-education: 8-22 mins G-Codes:  Malka So 04/13/2014, 2:23 PM [phone]

## 2014-04-13 NOTE — Progress Notes (Signed)
[facility] Rounding Note Subjective:  Pt being recommended for inpt rehab UOP 1600/24 hours with change to po lasix with net neg about 1 liter Creatinine reaching a plateau around 3 Wife declined to watch videos X3 yesterday - says was just too tired Wife and pt have talked - he wishes to proceed with dialysis when the time comes, and agreeable to vascular access planning now.  Objective Vital signs in last 24 hours: Filed Vitals:   04/12/14 0548 04/12/14 0949 04/12/14 2101 04/13/14 0634  BP: 154/76 164/81 132/60 128/58  Pulse: 72 78 87 76  Temp: 97.6 F (36.4 C) 97.7 F (36.5 C) 98 F (36.7 C) 99 F (37.2 C)  TempSrc: Axillary Axillary Oral Oral  Resp: 14 16 17 16   Height:      Weight:      SpO2: 95% 100% 97% 96%   Weight change:   Intake/Output Summary (Last 24 hours) at 04/13/14 1038 Last data filed at 04/13/14 0634  Gross per 24 hour  Intake    476 ml  Output   1600 ml  Net  -1124 ml   Physical Exam:  Blood pressure 137/70, pulse 69, temperature 97.4 F (36.3 C), temperature source Oral, resp. rate 17, height 5\' 5"  (1.651 m), weight 72.576 kg (160 lb), SpO2 95 %. Alert, no distress Mild jvd Chest decreased BS both bases Regular S1S2 No S3  no MRG noted Abd soft, NTND 2+ LE edema - some better but still some pitting to the hips Left arm flaccid   Recent Labs Lab 04/09/14 1523 04/10/14 0249 04/10/14 1552 04/11/14 0515 04/12/14 0424 04/13/14 0553  NA 141 139  --  137 137 139  K 3.9 3.7  --  3.3* 4.1 4.2  CL 110 108  --  108 104 104  CO2 26 27  --  25 23 24   GLUCOSE 273* 284*  --  108* 170* 226*  BUN 34* 35*  --  37* 42* 52*  CREATININE 2.40* 2.38*  --  2.57* 3.04* 3.06*  CALCIUM 8.3* 8.2*  --  8.0* 8.0* 7.9*  PHOS  --   --  5.0*  --  5.1* 4.6    Recent Labs Lab 04/09/14 1523 04/12/14 0424 04/13/14 0553  AST 42*  --   --   ALT 31  --   --   ALKPHOS 102  --   --   BILITOT 0.5  --   --   PROT 5.5*  --   --   ALBUMIN 1.8* 1.7*  1.5*    Recent Labs Lab 04/09/14 1523 04/10/14 0249 04/13/14 0553  WBC 9.7 10.1 8.0  NEUTROABS 7.6  --   --   HGB 11.9* 11.0* 10.0*  HCT 35.0* 32.4* 29.2*  MCV 89.7 89.3 89.6  PLT 261 238 240    Recent Labs Lab 04/09/14 2145 04/10/14 0249 04/10/14 0938 04/10/14 1552 04/10/14 2002  TROPONINI 0.08* 0.23* 0.05* 0.05* 0.15*   CBG:  Recent Labs Lab 04/12/14 0952 04/12/14 1131 04/12/14 1622 04/12/14 2124 04/13/14 0726  GLUCAP 206* 229* 158* 276* 202*    Iron Studies: No results for input(s): IRON, TIBC, TRANSFERRIN, FERRITIN in the last 168 hours. Studies/Results: Mr Herby Abraham Contrast  04/12/2014   CLINICAL DATA:  69 year old male with left side weakness. Uncontrolled hypertension and diabetes. Worsening extremity swelling. Initial encounter.  EXAM: MRI HEAD WITHOUT CONTRAST  TECHNIQUE: Multiplanar, multiecho pulse sequences of the brain and surrounding structures were obtained without intravenous contrast.  COMPARISON:  Brain MRI 02/02/2007.  Cerebral angiogram 02/04/2007.  FINDINGS: Confluent restricted diffusion tracking from the right corona radiata into the deep gray matter nuclei as well as the right temporal stem. The mesial right temporal lobe is spared. There is some involvement of the right thalamus.  Associated T2 and FLAIR hyperintensity. No associated hemorrhage or significant mass effect.  Major intracranial vascular flow voids are stable.  No contralateral or posterior fossa restricted diffusion. Cerebral volume has decreased since 2008. Mild ex vacuo ventricle enlargement has occurred. Mildly progressed and widespread Patchy and confluent cerebral white matter T2 and FLAIR hyperintensity. Occasional small areas of cortical encephalomalacia in both MCA territories. Progressed chronic T2 abnormality in the deep gray matter nuclei, especially the left thalamus. Increased T2 abnormality in the central on left pons. Cerebellum remains within normal limits.  No midline  shift, mass effect, or evidence of intracranial mass lesion. No acute intracranial hemorrhage identified. Negative pituitary and cervicomedullary junction. Partially visible cervical spine degeneration. Bone marrow signal within normal limits. Visible internal auditory structures appear normal. Mastoids are clear. Mild paranasal sinus mucosal thickening is similar to the prior exam. Visualized orbit soft tissues are within normal limits. Visualized scalp soft tissues are within normal limits.  IMPRESSION: 1. Patchy acute infarct in the right hemisphere affecting the corona radiata and deep gray matter nuclei. No associated hemorrhage or mass effect. 2. 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   Electronically Signed   By: Lars Pinks M.D.   On: 04/12/2014 09:26   Medications:   . amLODipine  10 mg Oral Daily  . aspirin  325 mg Oral Daily  . carvedilol  12.5 mg Oral BID WC  . enoxaparin (LOVENOX) injection  30 mg Subcutaneous Q24H  . furosemide  160 mg Oral BID  . insulin aspart  0-5 Units Subcutaneous QHS  . insulin aspart  0-9 Units Subcutaneous TID WC  . insulin aspart  3 Units Subcutaneous TID WC  . simvastatin  20 mg Oral q1800  . sodium chloride  3 mL Intravenous Q12H  . tamsulosin  0.4 mg Oral Daily    I  have reviewed scheduled and prn medications.  Background 69 y.o. year-old with hx of HTN, DM2 and CVA and CKD (creatinine 0.8 2008, 1.49 07/2013) presented with general weakness, inability to get up and swelling in face and extremities. Found to have increased creatinine at 2.4 and was admitted for renal failure and anasarca with low albumin. Was on diclofenac scheduled bid, ACEi , metformin - all held.    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 Certainly c/w diabetic nephropathy althugh the + hep C is a little bit of a ringer (needs quant RNA - and if this were HepC related disease, treatment would consist of treatment of the Hep C). SPEP, UPEP, ANCA and ANA negative and normal complements. Creatinine has risen some with diuresis as expected - but stable at around 3 past 24 hours. Have changed to po lasix with net neg balance though UOP slowed down. Will monitor for dose adjustments upward.  Still with a lot of edema. I have discussed that his kidney disease is advanced and that I would recommend going ahead with vein mapping, VVS evaluation - wife and patient have discussed and he  is agreeable to proceed - says he "wants to live".  Will proceed with vein mapping.    Nephrotic syndrome - as above. 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 Inpt rehab recommended  Elevated troponin. EF 45-50% No CP or cardiac sx. Not good candidate for ACE with advanced CKD  DM - per primary  HTN - meds  HLD statin  Anemia - no ESA indicated at this time but Hb has dropped to 10. Check iron studies. Aranesp if drops below 10.  Hep C+ - viral load pending.    Jamal Maes, MD [facility] [phone] pager 04/13/2014, 10:38 AM

## 2014-04-13 NOTE — Progress Notes (Addendum)
[name]  male  OZD:[id number]    DOB: 04-20-1945    DOA: 04/09/2014  PCP: Philis Fendt, MD  Brief history of present illness  Patient is a 69 year old male with uncontrolled hypertension, diabetes mellitus, prior history of CVA 2008, chronic kidney disease stage III, has not seen any nephrologist presented to ED with worsening swelling of his lower extremities, abdominal distention, overall generalized weakness. History was obtained from the patient who reported that his lower extremity swelling has been worsening in the last 1 year, was placed on Lasix 40 mg daily by his PCP 6 months ago. Denied any orthopnea or PND. Per his wife, he has abdominal distention, has been gaining weight (patient was not able to quantify), facial puffiness. Patient reported weakness and had a fall outside his house as he felt his legs were heavy and weak. Patient also reports incontinence, has difficulty urinating, with urgency and frequency. On Flomax In ED, patient was noted to have creatinine of 2.4, baseline 1.4 in May 2015, troponin +0.04, BNP 708.3. Patient is admitted for acute on chronic renal failure, and nephrology is on board.  04/12/14- Pt with c/o's of left sided weakness and evidence of new CVA on MRI, neurology is consulted.  Assessment/Plan:  Principal Problem: Acute renal failure on CK D stage III, baseline creatinine 1.4 in 07/2013, never seen any nephrologist, presenting with anasarca, hypoalbuminemia, albumin 1.8 -US renal-negative for any hydronephrosis or obstruction, shows chronic renal disease.  UA- > proteinuria -Creatinine worsened to 3.06 -Nephrology on board- rise in creat is expected, continue oral Lasix 160mg  BID. Hep C AB + (need quant RNA).  SPEP, UPEP, ANCA, ANA negative.  - Foley catheter placed, stric I& O- net neg 3.9 L since admission  Acute Right Hemisphere CVA -MRI brain-acute infarct in right hemisphere affecting corona radiata and deep gray matter  nuclei -Neurology on board- ASA  325, stroke workup  -Risk factors: Hx of CVA, wife states takes ASA 325mg  but pt poor historian and didn't mention at admission; Hgb A1c 8.2, LDL 185 -2-D echo-EF of 45-50%, severe LVH, grade 1 diastolic dysfunction -Carotid Dopplers pending -PT- CIR.  IR/OT consult pending -MBS- Regular; thin liquid. Placed on renal/carb modified  Diet -Tele-monitoring, frequent neuro checks  Elevated troponin: possible due to renal failure or CNS event-pt denies any cp, sob, or any cardiac symptoms  -Due to rising troponin, requested cardiology consult  -2D echo- EF 45-50%, severe LVH, w/o variations in wall motion -Per cardiology-Not candidate for cath due to AKI.  continue ASA, stain, carvedilol and amlodipine.  Consider noninvasive evaluation in future if creat stabilizes.  Diabetes mellitus uncontrolled with renal complications - Hgb Q5Z 8.2 - Placed on sliding scale insulin, hemoglobin A1c 8.2, added meal coverage  Hypertension -BP controlled -Per cards- Continue Norvasc, carvedilol, PRN IV hydralazine with parameters.  Bidil if additional BP control needed.  Hyperlipidemia/Hypercholesterolemia - Lipid panel with LDL 185, Choles 268 - Continue Zocor  BPH -Continue Flomax  Hx of CVA -Deficits of minimal right sided weakness -On ASA 325 at home?   DVT Prophylaxis: SQ Lovenox  Code Status: FULL  Family Communication:  No family at bedside  Disposition:  Consultants:  Renal  Cardiology  Procedures:  Renal US  2D echo- 45-50%, severe LVH, mildly reduced systolic function, no wall motion normal  Antibiotics:  none   Subjective: Denies any complaints, only nods to questions, follows commands  Objective: Weight change:   Intake/Output Summary (Last 24 hours) at 04/13/14 [phone]  Last data filed at 04/13/14 0634  Gross per 24 hour  Intake    476 ml  Output   1600 ml  Net  -1124 ml   Blood pressure 128/58, pulse 76, temperature 99 F  (37.2 C), temperature source Oral, resp. rate 16, height 5\' 5"  (1.651 m), weight 72.576 kg (160 lb), SpO2 96 %.  Physical Exam: General: Alert and awake, oriented x3 male in NAD  CVS: S1-S2 clear, no murmur rubs or gallops Chest: clear to auscultation bilaterally, no wheezing, rales or rhonchi Abdomen: soft nontender, nondistended, normal bowel sounds  Extremities: No cyanosis, clubbing, trace edema noted bilaterally (significant improvement).   Neuro: Right upper and lower extremities 5/5, LUE 2/5, LLE 0/5  Lab Results: Basic Metabolic Panel:  Recent Labs Lab 04/12/14 0424 04/13/14 0553  NA 137 139  K 4.1 4.2  CL 104 104  CO2 23 24  GLUCOSE 170* 226*  BUN 42* 52*  CREATININE 3.04* 3.06*  CALCIUM 8.0* 7.9*  PHOS 5.1* 4.6   Liver Function Tests:  Recent Labs Lab 04/09/14 1523 04/12/14 0424 04/13/14 0553  AST 42*  --   --   ALT 31  --   --   ALKPHOS 102  --   --   BILITOT 0.5  --   --   PROT 5.5*  --   --   ALBUMIN 1.8* 1.7* 1.5*   No results for input(s): LIPASE, AMYLASE in the last 168 hours. No results for input(s): AMMONIA in the last 168 hours. CBC:  Recent Labs Lab 04/09/14 1523 04/10/14 0249 04/13/14 0553  WBC 9.7 10.1 8.0  NEUTROABS 7.6  --   --   HGB 11.9* 11.0* 10.0*  HCT 35.0* 32.4* 29.2*  MCV 89.7 89.3 89.6  PLT 261 238 240   Cardiac Enzymes:  Recent Labs Lab 04/10/14 0938 04/10/14 1552 04/10/14 2002  TROPONINI 0.05* 0.05* 0.15*   BNP: Invalid input(s): POCBNP CBG:  Recent Labs Lab 04/12/14 0952 04/12/14 1131 04/12/14 1622 04/12/14 2124 04/13/14 0726  GLUCAP 206* 229* 158* 276* 202*     Micro Results: Recent Results (from the past 240 hour(s))  Urine culture     Status: None   Collection Time: 04/10/14  6:15 AM  Result Value Ref Range Status   Specimen Description URINE, CLEAN CATCH  Final   Special Requests NONE  Final   Colony Count   Final    70,000 COLONIES/ML Performed at [facility]    Culture    Final    Multiple bacterial morphotypes present, none predominant. Suggest appropriate recollection if clinically indicated. Performed at [facility]    Report Status 04/11/2014 FINAL  Final    Studies/Results: Dg Chest 2 View  04/09/2014   CLINICAL DATA:  Weakness rete 3 days, leg swelling for few months, dry cough for 2 weeks, hypertension, diabetes  EXAM: CHEST  2 VIEW  COMPARISON:  08/18/2013  FINDINGS: Enlargement of cardiac silhouette.  Mediastinal contours and pulmonary vascularity normal.  Lungs clear.  Tiny LEFT pleural effusion.  No pneumothorax.  Bones unremarkable.  IMPRESSION: Enlargement of cardiac silhouette.  Tiny LEFT pleural effusion.   Electronically Signed   By: Lavonia Dana M.D.   On: 04/09/2014 17:25   Mr Brain Wo Contrast  04/12/2014   CLINICAL DATA:  69 year old male with left side weakness. Uncontrolled hypertension and diabetes. Worsening extremity swelling. Initial encounter.  EXAM: MRI HEAD WITHOUT CONTRAST  TECHNIQUE: Multiplanar, multiecho pulse sequences of the brain and surrounding structures  were obtained without intravenous contrast.  COMPARISON:  Brain MRI 02/02/2007.  Cerebral angiogram 02/04/2007.  FINDINGS: Confluent restricted diffusion tracking from the right corona radiata into the deep gray matter nuclei as well as the right temporal stem. The mesial right temporal lobe is spared. There is some involvement of the right thalamus.  Associated T2 and FLAIR hyperintensity. No associated hemorrhage or significant mass effect.  Major intracranial vascular flow voids are stable.  No contralateral or posterior fossa restricted diffusion. Cerebral volume has decreased since 2008. Mild ex vacuo ventricle enlargement has occurred. Mildly progressed and widespread Patchy and confluent cerebral white matter T2 and FLAIR hyperintensity. Occasional small areas of cortical encephalomalacia in both MCA territories. Progressed chronic T2 abnormality in the deep gray matter  nuclei, especially the left thalamus. Increased T2 abnormality in the central on left pons. Cerebellum remains within normal limits.  No midline shift, mass effect, or evidence of intracranial mass lesion. No acute intracranial hemorrhage identified. Negative pituitary and cervicomedullary junction. Partially visible cervical spine degeneration. Bone marrow signal within normal limits. Visible internal auditory structures appear normal. Mastoids are clear. Mild paranasal sinus mucosal thickening is similar to the prior exam. Visualized orbit soft tissues are within normal limits. Visualized scalp soft tissues are within normal limits.  IMPRESSION: 1. Patchy acute infarct in the right hemisphere affecting the corona radiata and deep gray matter nuclei. No associated hemorrhage or mass effect. 2. 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   Electronically Signed   By: Lars Pinks M.D.   On: 04/12/2014 09:26   US Renal  04/10/2014   CLINICAL DATA:  Acute renal failure.  Initial encounter.  EXAM: RENAL/URINARY TRACT ULTRASOUND COMPLETE  COMPARISON:  None.  FINDINGS: Right Kidney:  Length: 10.3 cm. Increased renal parenchymal echogenicity is noted. Mild right-sided perinephric fluid is noted. No mass or hydronephrosis visualized.  Left Kidney:  Length: 11.9 cm. Mildly increased renal parenchymal echogenicity is noted. Mild left-sided perinephric fluid is seen. No mass or hydronephrosis visualized.  Bladder:  Minimal apparent echoes near the anterior bladder wall may be artifactual in nature. The bladder is otherwise unremarkable. Bilateral ureteral jets are visualized.  IMPRESSION: 1. Increased renal cortical echogenicity raises concern for medical renal disease. 2. No evidence of hydronephrosis.  Bilateral ureteral jets are seen. 3. Mild bilateral perinephric fluid noted, nonspecific in nature.   Electronically Signed   By: Garald Balding M.D.   On: 04/10/2014 04:31     Medications: Scheduled Meds: . amLODipine  10 mg Oral Daily  . aspirin  325 mg Oral Daily  . carvedilol  12.5 mg Oral BID WC  . enoxaparin (LOVENOX) injection  30 mg Subcutaneous Q24H  . furosemide  160 mg Oral BID  . insulin aspart  0-5 Units Subcutaneous QHS  . insulin aspart  0-9 Units Subcutaneous TID WC  . insulin aspart  3 Units Subcutaneous TID WC  . simvastatin  20 mg Oral q1800  . sodium chloride  3 mL Intravenous Q12H  . tamsulosin  0.4 mg Oral Daily      LOS: 4 days    Lacy Duverney [facility] 04/13/2014, 9:37 AM Pager: [phone]  If 7PM-7AM, please contact night-coverage [email or web address] Password [email or web address]

## 2014-04-13 NOTE — Progress Notes (Signed)
Rehab admissions - I met with pt, his wife and family in follow up to rehab MD consult to explain the possibility of inpatient rehab. I had also reviewed pt's case with Lorriane Shire, Education officer, museum and noted Vanessa's earlier note from today. Pt was not really responsive during our discussion, kept his eyes closed and nodded once. Further questions were answered about our rehab and informational brochures were given. Three other family members were in the room.  I then met with pt's wife and Lorriane Shire outside of pt's room for further discussion to clarify wife's understanding. 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 It was emphasized that rehab MD has projected that pt will need minimal assistance at the end of a rehab stay and that she earlier stated she would not be able to provide that needed care due to her work schedule.  In addition, the topic of [employer] copays came up and wife initially stated that they would not be able to pay the copay costs associated with CIR. Please note Vanessa's earlier note from today explaining that pt and his wife have a lien on their home for $63,000 outstanding medical bills. Wife did request that I get the specific financial information from [place]. Wife stated that they would not be able to pay the copays and thought rehab would be covered by insurance.  The plan is to meet tomorrow at 1400 with pt's wife and Lorriane Shire, Education officer, museum to determine pt/wife's preferences. Wife was agreeable for social worker to begin the SNF search as well.  I will review this case with rehab team also and share update tomorrow.  Thanks.  Nanetta Batty, PT Rehabilitation Admissions Coordinator [phone]

## 2014-04-13 NOTE — Clinical Social Work Note (Signed)
CSW and Nanetta Batty, rehab admissions coordinator met with Mrs. Michl to discuss CIR vs SNF placement and insurance. Wife is interested in [facility] and wants to try this route before considering SNF, however she did give CSW permission to send patient's information out to [facility] in [facility].   Mrs. Thueson later provided CSW with a copy of patient's insurance card - [employer] H[employer]. Wife indicated that she knows nothing about patient having [employer] coverage. CSW will f/u with financial counselor regarding patient's insurance. CSW will send patient's information out to facilities in [name].  Deniah Saia Givens, MSW, LCSW Licensed Clinical Social Worker [place] [phone]

## 2014-04-13 NOTE — Consult Note (Signed)
Physical Medicine and Rehabilitation Consult Reason for Consult: Acute right CR CVA Referring Physician: [facility]   HPI: [name] is a 69 y.o. right handed male with history of uncontrolled hypertension, chronic combined systolic and diastolic congestive heart failure, diabetes mellitus, prior CVA 2008, chronic renal insufficiency with baseline creatinine 1.49 in May 2015 . patient lives with spouse used a right AFO and cane since CVA in 2008. Presented 04/09/2014 with increased swelling of the lower extremity as well as abdominal distention and generalized weakness. Renal ultrasound with no hydronephrosis. ACE inhibitor was held. Mildly elevated troponin 0.23. Echocardiogram with ejection fraction of 11% grade 1 diastolic dysfunction. Cardiology service is consulted and added low-dose aspirin not a candidate for cardiac cath. On 04/12/2014 with left-sided weakness. MRI showed patchy acute infarct right hemisphere affecting the CR and deep gray matter nuclei. 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 Patient did not receive TPA. Neurology consult is advised to continue aspirin therapy. Subcutaneous Lovenox added for DVT prophylaxis. Renal service follow-up close monitoring of creatinine latest 3.06 and awaiting plan for venous mapping and VVS evaluation considering planned for dialysis. Swallow study completed 04/13/2014 and presently maintained on a regular diet. Physical therapy evaluation completed with recommendations of physical medicine rehabilitation consult  Daughter and wife at bedside. Patient with history of right-sided weakness from CVA in 2008. Was modified independent with dressing and bathing as well as meal prep. Was using a right AFO. Patient has no pain complaints today. Review of Systems  Gastrointestinal: Positive for abdominal pain.  Genitourinary: Positive for urgency.  Musculoskeletal: Positive for myalgias.  Neurological:  Positive for weakness and headaches.  All other systems reviewed and are negative.  Past Medical History  Diagnosis Date  . Hypertension   . Hypercholesterolemia   . Diabetes mellitus without complication   . Glaucoma   . Stroke     a. 01/2007 L corona radiata infarct.  . Cerebrovascular disease     a. 01/2007 MRI/A: L MCA 65-79%, R MCA 03%, LICA 83%.  . CKD (chronic kidney disease), stage III   . Chronic combined systolic and diastolic CHF, NYHA class 2     a. 01/2007 Echo: EF 60-65%;  b. 03/2014 Echo: EF 45-50%, Gr 1 DD, severe LVH.  Marland Kitchen Headache    Past Surgical History  Procedure Laterality Date  . Colonoscopy w/ biopsies and polypectomy    . Colon surgery    . Trabeculectomy Left 08/18/2013    Procedure: TRABECULECTOMY WITH TUBE WITH [facility];  Surgeon: [name], MD;  Location: [place];  Service: Ophthalmology;  Laterality: Left;   Family History  Problem Relation Age of Onset  . Other      Negative for premature CAD.   Social History:  reports that he has never smoked. He has never used smokeless tobacco. He reports that he does not drink alcohol or use illicit drugs. Allergies: No Known Allergies Medications Prior to Admission  Medication Sig Dispense Refill  . amLODipine (NORVASC) 5 MG tablet Take 5 mg by mouth daily.    . diclofenac (VOLTAREN) 25 MG EC tablet Take 25 mg by mouth 2 (two) times daily.    . diclofenac sodium (VOLTAREN) 1 % GEL Apply 2 g topically 4 (four) times daily.    . furosemide (LASIX) 40 MG tablet Take 40 mg by mouth daily.    . metFORMIN (GLUCOPHAGE) 1000 MG tablet Take 1,000 mg by mouth 2 (two) times daily with a meal.    .  quinapril (ACCUPRIL) 40 MG tablet Take 40 mg by mouth at bedtime.    . simvastatin (ZOCOR) 20 MG tablet Take 20 mg by mouth daily.    . tamsulosin (FLOMAX) 0.4 MG CAPS capsule Take 0.4 mg by mouth.      Home: Home Living Family/patient expects to be discharged to:: Private residence Living Arrangements: Spouse/significant  other, Children  Functional History: Prior Function Level of Independence: Independent with assistive device(s) Comments: per daughter was walking with cane and Rt AFO  Functional Status:  Mobility: Bed Mobility Overal bed mobility: Needs Assistance Bed Mobility: Rolling Rolling: Total assist General bed mobility comments: roll Lt with max assist partially due to Lt visual field cut and difficulty finding Lt side/rail/etc; roll Rt total assist with RUE using Rt rail        ADL:    Cognition: Cognition Overall Cognitive Status: Within Functional Limits for tasks assessed Orientation Level: Oriented to person, Disoriented to time, Disoriented to situation, Oriented to place Cognition Arousal/Alertness: Awake/alert Behavior During Therapy: [facility] for tasks assessed/performed (? slightly labile) Overall Cognitive Status: Within Functional Limits for tasks assessed  Blood pressure 128/58, pulse 76, temperature 99 F (37.2 C), temperature source Oral, resp. rate 16, height 5\' 5"  (1.651 m), weight 72.576 kg (160 lb), SpO2 96 %. Physical Exam  Vitals reviewed. HENT:  Head: Normocephalic.  Eyes:  Pupils sluggish but reactive to light  Neck: Normal range of motion. Neck supple. No thyromegaly present.  Cardiovascular: Normal rate and regular rhythm.   Respiratory: Effort normal and breath sounds normal. No respiratory distress.  GI: Soft. Bowel sounds are normal. He exhibits no distension.  Neurological:  Patient is lethargic but arousable. He was able to provide his name age and date of birth but would easily fall asleep during exam. He followed simple commands.  Skin: Skin is warm and dry.  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  Results for orders placed or performed during the hospital encounter of 04/09/14 (from the past 24 hour(s))  Glucose, capillary     Status: Abnormal   Collection Time: 04/12/14  9:52 AM  Result Value Ref Range   Glucose-Capillary 206 (H) 70 - 99 mg/dL  Glucose, capillary     Status: Abnormal   Collection Time: 04/12/14 11:31 AM  Result Value Ref Range   Glucose-Capillary 229 (H) 70 - 99 mg/dL  Glucose, capillary     Status: Abnormal   Collection Time: 04/12/14  4:22 PM  Result Value Ref Range   Glucose-Capillary 158 (H) 70 - 99 mg/dL  Glucose, capillary     Status: Abnormal   Collection Time: 04/12/14  9:24 PM  Result Value Ref Range   Glucose-Capillary 276 (H) 70 - 99 mg/dL  CBC     Status: Abnormal   Collection Time: 04/13/14  5:53 AM  Result Value Ref Range   WBC 8.0 4.0 - 10.5 K/uL   RBC 3.26 (L) 4.22 - 5.81 MIL/uL   Hemoglobin 10.0 (L) 13.0 - 17.0 g/dL   HCT 29.2 (L) 39.0 - 52.0 %   MCV 89.6 78.0 - 100.0 fL   MCH 30.7 26.0 - 34.0 pg  MCHC 34.2 30.0 - 36.0 g/dL   RDW 13.0 11.5 - 15.5 %   Platelets 240 150 - 400 K/uL  Renal function panel     Status: Abnormal   Collection Time: 04/13/14  5:53 AM  Result Value Ref Range   Sodium 139 135 - 145 mmol/L   Potassium 4.2 3.5 - 5.1 mmol/L   Chloride 104 96 - 112 mEq/L   CO2 24 19 - 32 mmol/L   Glucose, Bld 226 (H) 70 - 99 mg/dL   BUN 52 (H) 6 - 23 mg/dL   Creatinine, Ser 3.06 (H) 0.50 - 1.35 mg/dL   Calcium 7.9 (L) 8.4 - 10.5 mg/dL   Phosphorus 4.6 2.3 - 4.6 mg/dL   Albumin 1.5 (L) 3.5 - 5.2 g/dL   GFR calc non Af Amer 20 (L) >90 mL/min   GFR calc Af Amer 23 (L) >90 mL/min   Anion gap 11 5 - 15  Glucose, capillary     Status: Abnormal   Collection Time: 04/13/14  7:26 AM  Result Value Ref Range   Glucose-Capillary 202 (H) 70 - 99 mg/dL   Mr Brain Wo Contrast  04/12/2014   CLINICAL DATA:  69 year old male with left side  weakness. Uncontrolled hypertension and diabetes. Worsening extremity swelling. Initial encounter.  EXAM: MRI HEAD WITHOUT CONTRAST  TECHNIQUE: Multiplanar, multiecho pulse sequences of the brain and surrounding structures were obtained without intravenous contrast.  COMPARISON:  Brain MRI 02/02/2007.  Cerebral angiogram 02/04/2007.  FINDINGS: Confluent restricted diffusion tracking from the right corona radiata into the deep gray matter nuclei as well as the right temporal stem. The mesial right temporal lobe is spared. There is some involvement of the right thalamus.  Associated T2 and FLAIR hyperintensity. No associated hemorrhage or significant mass effect.  Major intracranial vascular flow voids are stable.  No contralateral or posterior fossa restricted diffusion. Cerebral volume has decreased since 2008. Mild ex vacuo ventricle enlargement has occurred. Mildly progressed and widespread Patchy and confluent cerebral white matter T2 and FLAIR hyperintensity. Occasional small areas of cortical encephalomalacia in both MCA territories. Progressed chronic T2 abnormality in the deep gray matter nuclei, especially the left thalamus. Increased T2 abnormality in the central on left pons. Cerebellum remains within normal limits.  No midline shift, mass effect, or evidence of intracranial mass lesion. No acute intracranial hemorrhage identified. Negative pituitary and cervicomedullary junction. Partially visible cervical spine degeneration. Bone marrow signal within normal limits. Visible internal auditory structures appear normal. Mastoids are clear. Mild paranasal sinus mucosal thickening is similar to the prior exam. Visualized orbit soft tissues are within normal limits. Visualized scalp soft tissues are within normal limits.  IMPRESSION: 1. Patchy acute infarct in the right hemisphere affecting the corona radiata and deep gray matter nuclei. No associated hemorrhage or mass effect. 2. Underlying severe chronic  small vessel disease with progression since 2008.   Electronically Signed   By: Lars Pinks M.D.   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 Does the need for close, 24 hr/day medical supervision in concert with the patient's rehab needs make it unreasonable for this patient to be served in a less intensive setting? Yes 2. Co-Morbidities requiring supervision/potential complications: Left homonomous hemianopsia, diabetes, hypertension, cardiomyopathy 3. Due to bladder management, bowel management, safety, skin/wound care, disease management, medication administration, pain management and patient education, does the patient  require 24 hr/day rehab nursing? Yes 4. Does the patient require coordinated care of a physician, rehab nurse, PT (1-2 hrs/day, 5 days/week), OT (1-2 hrs/day, 5 days/week) and SLP (0.5-1 hrs/day, 5 days/week) to address physical and functional deficits in the context of the above medical diagnosis(es)? Yes Addressing deficits in the following areas: balance, endurance, locomotion, strength, transferring, bowel/bladder control, bathing, dressing, feeding, grooming, toileting, cognition, speech, language and swallowing 5. Can the patient actively participate in an intensive therapy program of at least 3 hrs of therapy per day at least 5 days per week? Yes 6. The potential for patient to make measurable gains while on inpatient rehab is good 7. Anticipated functional outcomes upon discharge from inpatient rehab are min assist  with PT, min assist with OT, min assist with SLP. 8. Estimated rehab length of stay to reach the above functional goals is: 20-25 days 9. Does the patient have adequate social supports and living environment to accommodate these discharge functional goals? Yes 10. Anticipated D/C setting: Home 11. Anticipated post  D/C treatments: [place] therapy 12. Overall Rehab/Functional Prognosis: good  RECOMMENDATIONS: This patient's condition is appropriate for continued rehabilitative care in the following setting: CIR Patient has agreed to participate in recommended program. Yes Note that insurance prior authorization may be required for reimbursement for recommended care.  Comment: Discussed that rehabilitation will be more difficult after the this stroke and prior stroke given bilateral lower extremity weakness.    04/13/2014

## 2014-04-13 NOTE — Progress Notes (Signed)
STROKE TEAM PROGRESS NOTE   HISTORY [name] is an 69 y.o. male who was admitted 04/09/2014 with increasing lower extremity weakness. in hospital was noted by family to not be moving his left arm as well on Sunday 04/10/2014 afternoon. By Monday 04/11/2014 morning they report that he was not moving his upper extremity at all. Nursing was notified at that time and work up was initiated. Patient with history of stroke in the past that affected his right side. Much of that strength has returned.He was last known well 04/10/2014, unable to determine time. Patient was not administered TPA secondary to out of time window.    SUBJECTIVE (INTERVAL HISTORY) His daughter is at the bedside.  Overall he feels his condition is unchanged. 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 Bilaterally increased tone. MRI done showed right anterior choroidal artery territory stroke, likely due to large vessel atherosclerosis.     OBJECTIVE Temp:  [98 F (36.7 C)-99 F (37.2 C)] 99 F (37.2 C) (01/20 0634) Pulse Rate:  [76-87] 76 (01/20 0634) Cardiac Rhythm:  [-]  Resp:  [16-17] 16 (01/20 0634) BP: (128-132)/(58-60) 128/58 mmHg (01/20 0634) SpO2:  [96 %-97 %] 96 % (01/20 0634)   Recent Labs Lab 04/12/14 0952 04/12/14 1131 04/12/14 1622 04/12/14 2124 04/13/14 0726  GLUCAP 206* 229* 158* 276* 202*    Recent Labs Lab 04/09/14 1523 04/10/14 0249 04/10/14 1552 04/11/14 0515 04/12/14 0424 04/13/14 0553  NA 141 139  --  137 137 139  K 3.9 3.7  --  3.3* 4.1 4.2  CL 110 108  --  108 104 104  CO2 26 27  --  25 23 24   GLUCOSE 273* 284*  --  108* 170* 226*  BUN 34* 35*  --  37* 42* 52*  CREATININE 2.40* 2.38*  --  2.57* 3.04* 3.06*  CALCIUM 8.3* 8.2*  --  8.0* 8.0* 7.9*  PHOS  --   --  5.0*  --  5.1* 4.6    Recent Labs Lab 04/09/14 1523 04/12/14 0424 04/13/14 0553  AST 42*  --   --   ALT 31  --   --   ALKPHOS 102  --   --   BILITOT 0.5  --   --   PROT 5.5*  --   --    ALBUMIN 1.8* 1.7* 1.5*    Recent Labs Lab 04/09/14 1523 04/10/14 0249 04/13/14 0553  WBC 9.7 10.1 8.0  NEUTROABS 7.6  --   --   HGB 11.9* 11.0* 10.0*  HCT 35.0* 32.4* 29.2*  MCV 89.7 89.3 89.6  PLT 261 238 240    Recent Labs Lab 04/09/14 2145 04/10/14 0249 04/10/14 0938 04/10/14 1552 04/10/14 2002  TROPONINI 0.08* 0.23* 0.05* 0.05* 0.15*   No results for input(s): LABPROT, INR in the last 72 hours. No results for input(s): COLORURINE, LABSPEC, [place], GLUCOSEU, HGBUR, BILIRUBINUR, KETONESUR, PROTEINUR, UROBILINOGEN, NITRITE, LEUKOCYTESUR in the last 72 hours.  Invalid input(s): APPERANCEUR     Component Value Date/Time   CHOL 268* 04/12/2014 0424   TRIG 124 04/12/2014 0424   HDL 58 04/12/2014 0424   CHOLHDL 4.6 04/12/2014 0424   VLDL 25 04/12/2014 0424   LDLCALC 185* 04/12/2014 0424   Lab Results  Component Value Date   HGBA1C 8.2* 04/10/2014   No results found for: LABOPIA, COCAINSCRNUR, LABBENZ, AMPHETMU, THCU, LABBARB  No results for input(s): ETH in the last 168 hours.  I have personally reviewed the radiological images below and agree  with the radiology interpretations.  Dg Chest 2 View  04/09/2014   Enlargement of cardiac silhouette.  Tiny LEFT pleural effusion.     Mr Brain Wo Contrast  04/12/2014   1. Patchy acute infarct in the right hemisphere affecting the corona radiata and deep gray matter nuclei. No associated hemorrhage or mass effect. 2. 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  The infarct territory is in anterior choroidal artery.   US Renal  04/10/2014    1. Increased renal cortical echogenicity raises concern for medical renal disease. 2. No evidence of hydronephrosis.  Bilateral ureteral jets are seen. 3. Mild bilateral perinephric fluid noted, nonspecific in nature.      Carotid Doppler    There is 1-39% bilateral ICA stenosis. Vertebral artery flow is antegrade.    2D Echocardiogram    Left  ventricle: The cavity size was normal. Wall thickness was increased in a pattern of severe LVH. Systolic function wasmildly reduced. The estimated ejection fraction was in the rangeof 45% to 50%. Wall motion was normal; there were no regionalwall motion abnormalities. Doppler parameters are consistent with abnormal left ventricular relaxation (grade 1 diastolicdysfunction).  PHYSICAL EXAM  Temp:  [97.5 F (36.4 C)-99 F (37.2 C)] 97.5 F (36.4 C) (01/20 1117) Pulse Rate:  [72-87] 72 (01/20 1117) Resp:  [16-17] 17 (01/20 1117) BP: (128-145)/(58-71) 145/71 mmHg (01/20 1117) SpO2:  [95 %-97 %] 95 % (01/20 1117)  General - Well nourished, well developed, in no apparent distress.  Ophthalmologic - not able to see through due to right side glaucoma and not cooperative.  Cardiovascular - Regular rate and rhythm with no murmur.  Mental Status -  Level of arousal and orientation to time, place, and person were intact. Language including expression, naming, repetition, comprehension was assessed and found to have partial naming difficulty. Mild to moderate dysarthria. Fund of Knowledge was assessed and was intact.  Cranial Nerves II - XII - II - Visual field testing inconsistent, but showed left hemianopia with patchy visual field deficit at the right, may related to combination of stroke and glaucoma. Decreased visual acuity bilaterally too. III, IV, VI - Extraocular movements intact. V - Facial sensation intact bilaterally. VII - left nasolabial fold flatening. VIII - Hearing & vestibular intact bilaterally. X - Palate elevates symmetrically, mild to moderate dysarthria. XI - Chin turning & shoulder shrug intact bilaterally. XII - Tongue protrusion intact.  Motor Strength - The patient's strength was 4+/5 right UE, but 0/5 left UE, right LE 3+/5, and LLE 2+/5 on pain stimulation.  Bulk was normal and fasciculations were absent.   Motor Tone - Muscle tone was increased bilateral LEs,  normal UEs.  Reflexes - The patient's reflexes were increased at RLE and he had no pathological reflexes.  Sensory - Light touch, temperature/pinprick were assessed and were normal.    Coordination - The patient had normal movements in the right hand with no ataxia or dysmetria.  Tremor was absent.  Gait and Station - not tested due to weakness.  ASSESSMENT/PLAN [name] is a 69 y.o. male with history of uncontrolled hypertension, diabetes mellitus, prior history of CVA 2008 at left corona radiata, acute on chronic kidney disease who was admitted for worsening swelling of his lower extremities, abdominal distention, overall generalized weakness. In hospital he developed LUE weakness. He did not receive IV t-PA due to unknown time of onset.   Stroke:  Non-dominant right anterior choroidal artery territory infarct secondary to large  vessel atherosclerosis. His infarct location and symptoms with hemiparesis and hemianopia consistent with AChA stroke. His risk factor for stroke including uncontrolled HTN, DM, prior stroke, severe WM ischemic changes, CKD and HLD. cardioembolic AchA stroke less likely with many risk factors like these and also incomplete AChA infarct. Hx L corona radiata infarct; known intracranial atherosclerosis  Resultant  Left hemiparesis and left hemianopia  MRI  Right AChA territory infarct, severe small vessel disease   MRA pending (ordered)  Carotid Doppler  No significant stenosis   2D Echo EF 45-50%  Lovenox 30 mg sq daily for VTE prophylaxis  Diet renal/carb modified with 1200 ml fluid restriction thin liquids  no antithrombotic prior to admission, now on aspirin 325 mg orally every day. Recommend to switch to plavix if not contraindicated (ordered).  Patient counseled to be compliant with his antithrombotic medications  Ongoing aggressive stroke risk factor management  Therapy recommendations:  CIR  Disposition:   pending  Hypertension  Stable now  Home meds - amlodipine and lasix  Permissive hypertension (OK if <220/120) for 24-48 hours post stroke and then gradually normalized within 5-7 days.  Patient counseled to be compliant with his blood pressure medications  Hyperlipidemia  Home meds:  zocor 20 mg daily, resumed in hospital  LDL 185, goal < 70  Recommend to switch to lipitor 80mg  (ordered)  Continue statin at discharge  Diabetes  HgbA1c 8.2, not at goal < 7.0  Uncontrolled  On insulin  SSI  Close monitoring  CKD stage III  Nephrology on board  Likely due to DM and HTN  avoid hypotension with diuresis to avoid infarct extension.   Other Stroke Risk Factors  Advanced age  Hx stroke/TIA - 01/2007 L corona radiata infarct.  Other Active Problems  Elevated troponins, per cardiology not a cath candidate, possibly due to CKD  BPH  Chronic combined systolic and diastolic CHF  Hospital day # 4  Rosalin Hawking, MD PhD Stroke Neurology 04/13/2014 1:07 PM     To contact Stroke Continuity provider, please refer to [email or web address] After hours, contact General Neurology

## 2014-04-14 DIAGNOSIS — I639 Cerebral infarction, unspecified: Secondary | ICD-10-CM

## 2014-04-14 LAB — GLUCOSE, CAPILLARY
GLUCOSE-CAPILLARY: 163 mg/dL — AB (ref 70–99)
Glucose-Capillary: 92 mg/dL (ref 70–99)
Glucose-Capillary: 105 mg/dL — ABNORMAL HIGH (ref 70–99)
Glucose-Capillary: 69 mg/dL — ABNORMAL LOW (ref 70–99)
Glucose-Capillary: 155 mg/dL — ABNORMAL HIGH (ref 70–99)

## 2014-04-14 LAB — CBC
HCT: 31.3 % — ABNORMAL LOW (ref 39.0–52.0)
Hemoglobin: 10.6 g/dL — ABNORMAL LOW (ref 13.0–17.0)
MCH: 30.5 pg (ref 26.0–34.0)
MCHC: 33.9 g/dL (ref 30.0–36.0)
MCV: 90.2 fL (ref 78.0–100.0)
Platelets: 297 10*3/uL (ref 150–400)
RBC: 3.47 MIL/uL — ABNORMAL LOW (ref 4.22–5.81)
RDW: 13.1 % (ref 11.5–15.5)
WBC: 6.9 10*3/uL (ref 4.0–10.5)

## 2014-04-14 LAB — BASIC METABOLIC PANEL
Anion gap: 10 (ref 5–15)
BUN: 55 mg/dL — ABNORMAL HIGH (ref 6–23)
CALCIUM: 8.2 mg/dL — AB (ref 8.4–10.5)
CHLORIDE: 103 meq/L (ref 96–112)
CO2: 26 mmol/L (ref 19–32)
Creatinine, Ser: 3.09 mg/dL — ABNORMAL HIGH (ref 0.50–1.35)
GFR calc Af Amer: 22 mL/min — ABNORMAL LOW (ref >90)
GFR calc non Af Amer: 19 mL/min — ABNORMAL LOW (ref >90)
Glucose, Bld: 87 mg/dL (ref 70–99)
POTASSIUM: 4 mmol/L (ref 3.5–5.1)
Sodium: 139 mmol/L (ref 135–145)

## 2014-04-14 NOTE — Progress Notes (Signed)
Physical Therapy Treatment Patient Details Name: [name] MRN: [id number] DOB: [birth year] Today's Date: 04/14/2014    History of Present Illness Adm 04/09/14 with h/o gradual progressive weakness and incr edema; +renal failure. 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 Pt was overall more awake this session but conversation was limited and pt mainly answers questions with head nods. Decreased LE extensor tone noted today, and pt was able to be transferred to the recliner chair safely utilizing lateral scoot method with bed pad for hip support. Noted that pt was able to self-feed with supervision, cueing, and occasional assist. Will continue to follow.   Follow Up Recommendations  CIR;Supervision/Assistance - 24 hour     Equipment Recommendations  Wheelchair (measurements PT);Wheelchair cushion (measurements PT);3in1 (PT);Other (comment)    Recommendations for Other Services       Precautions / Restrictions Precautions Precautions: Fall Precaution Comments: Lt shoulder at risk for subluxation Required Braces or Orthoses: Other Brace/Splint Other Brace/Splint: wears Rt AFO (post L CVA '08) Restrictions Weight Bearing Restrictions: No    Mobility  Bed Mobility Overal bed mobility: Needs Assistance Bed Mobility: Supine to Sit     Supine to sit: Total assist;+2 for physical assistance;HOB elevated     General bed mobility comments: Pt was able to transition to EOB while utilizing helicopter technique. No extensor tone noted this session, and pt was able to sit with feet resting on the floor. Sat ~10 minutes EOB.   Transfers Overall transfer level: Needs assistance Equipment used: 2 person hand held assist Transfers: Lateral/Scoot Transfers          Lateral/Scoot Transfers: Total assist;+2 physical  assistance General transfer comment: Bed pad used for scooting assist bed to drop-arm recliner. Pt anteriorly resting head on therapists shoulders for support, but otherwise did not really participate in scoot transfer.   Ambulation/Gait             General Gait Details: Did not attempt this session.    Stairs            Wheelchair Mobility    Modified Rankin (Stroke Patients Only) Modified Rankin (Stroke Patients Only) Pre-Morbid Rankin Score: No significant disability ([facility] Modified Rankin: Severe disability     Balance Overall balance assessment: Needs assistance Sitting-balance support: Feet supported;Single extremity supported Sitting balance-[name]: Poor   Postural control: Posterior lean                          Cognition Arousal/Alertness: Awake/alert Behavior During Therapy: WFL for tasks assessed/performed Overall Cognitive Status: Impaired/Different from baseline Area of Impairment: Attention;Following commands;Awareness;Problem solving   Current Attention Level: Selective   Following Commands: Follows one step commands inconsistently;Follows one step commands with increased time   Awareness: Emergent Problem Solving: Slow processing;Decreased initiation;Difficulty sequencing;Requires verbal cues;Requires tactile cues General Comments: Pt was able to participate in weight shifting activity while sitting EOB. Was able to lean onto R and L elbows and prop himself up with some assist, however was not able to initiate righting trunk to midline. Worked with pt to improve seated posture, including cervical extension and scapular retraction. While sitting EOB did not note any active movement of LE's, R or L.    Exercises  General Comments General comments (skin integrity, edema, etc.): Pt was able to participate in weight shifting activity while sitting EOB. Was able to lean onto R and L elbows and prop himself  up with some assist, however was not able to initiate righting trunk to midline. While sitting EOB did not note any active movement of LE's, R or L.      Pertinent Vitals/Pain Pain Assessment: No/denies pain    Home Living                      Prior Function            PT Goals (current goals can now be found in the care plan section) Acute Rehab PT Goals Patient Stated Goal: agrees he wants to get stronger PT Goal Formulation: With patient Time For Goal Achievement: 04/26/14 Potential to Achieve Goals: Good Progress towards PT goals: Progressing toward goals    Frequency  Min 4X/week    PT Plan Current plan remains appropriate    Co-evaluation PT/OT/SLP Co-Evaluation/Treatment: Yes Reason for Co-Treatment: Complexity of the patient's impairments (multi-system involvement);For patient/therapist safety PT goals addressed during session: Mobility/safety with mobility;Balance       End of Session Equipment Utilized During Treatment: Gait belt Activity Tolerance: Patient limited by fatigue Patient left: in chair;with call bell/phone within reach;with nursing/sitter in room     Time: 0830-0908 PT Time Calculation (min) (ACUTE ONLY): 38 min  Charges:  $Therapeutic Activity: 8-22 mins                    G Codes:      Rolinda Roan 04-18-14, 10:03 AM   Rolinda Roan, PT, DPT Acute Rehabilitation Services Pager: [phone]

## 2014-04-14 NOTE — Progress Notes (Signed)
STROKE TEAM PROGRESS NOTE   HISTORY [name] is an 69 y.o. male who was admitted 04/09/2014 with increasing lower extremity weakness. in hospital was noted by family to not be moving his left arm as well on Sunday 04/10/2014 afternoon. By Monday 04/11/2014 morning they report that he was not moving his upper extremity at all. Nursing was notified at that time and work up was initiated. Patient with history of stroke in the past that affected his right side. Much of that strength has returned.He was last known well 04/10/2014, unable to determine time. Patient was not administered TPA secondary to out of time window.    SUBJECTIVE (INTERVAL HISTORY) His another daughter is at the bedside.  Overall he feels his condition is unchanged. 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 Bilaterally increased tone. MRI done showed right anterior choroidal artery territory stroke, and MRA showed diffuse athero.    OBJECTIVE Temp:  [98.4 F (36.9 C)-98.6 F (37 C)] 98.4 F (36.9 C) (01/21 0951) Pulse Rate:  [71-74] 74 (01/21 0951) Cardiac Rhythm:  [-]  Resp:  [16-18] 18 (01/21 0951) BP: (115-149)/(66-81) 148/68 mmHg (01/21 0951) SpO2:  [98 %-99 %] 98 % (01/21 0951) Weight:  [147 lb 14.9 oz (67.1 kg)] 147 lb 14.9 oz (67.1 kg) (01/21 0500)   Recent Labs Lab 04/13/14 1229 04/13/14 1651 04/13/14 2116 04/14/14 0754 04/14/14 1218  GLUCAP 233* 224* 133* 92 105*    Recent Labs Lab 04/10/14 0249 04/10/14 1552 04/11/14 0515 04/12/14 0424 04/13/14 0553 04/14/14 0915  NA 139  --  137 137 139 139  K 3.7  --  3.3* 4.1 4.2 4.0  CL 108  --  108 104 104 103  CO2 27  --  25 23 24 26   GLUCOSE 284*  --  108* 170* 226* 87  BUN 35*  --  37* 42* 52* 55*  CREATININE 2.38*  --  2.57* 3.04* 3.06* 3.09*  CALCIUM 8.2*  --  8.0* 8.0* 7.9* 8.2*  PHOS  --  5.0*  --  5.1* 4.6  --     Recent Labs Lab 04/09/14 1523 04/12/14 0424 04/13/14 0553  AST 42*  --   --   ALT 31  --   --   ALKPHOS  102  --   --   BILITOT 0.5  --   --   PROT 5.5*  --   --   ALBUMIN 1.8* 1.7* 1.5*    Recent Labs Lab 04/09/14 1523 04/10/14 0249 04/13/14 0553 04/14/14 0915  WBC 9.7 10.1 8.0 6.9  NEUTROABS 7.6  --   --   --   HGB 11.9* 11.0* 10.0* 10.6*  HCT 35.0* 32.4* 29.2* 31.3*  MCV 89.7 89.3 89.6 90.2  PLT 261 238 240 297    Recent Labs Lab 04/09/14 2145 04/10/14 0249 04/10/14 0938 04/10/14 1552 04/10/14 2002  TROPONINI 0.08* 0.23* 0.05* 0.05* 0.15*   No results for input(s): LABPROT, INR in the last 72 hours. No results for input(s): COLORURINE, LABSPEC, [place], GLUCOSEU, HGBUR, BILIRUBINUR, KETONESUR, PROTEINUR, UROBILINOGEN, NITRITE, LEUKOCYTESUR in the last 72 hours.  Invalid input(s): APPERANCEUR     Component Value Date/Time   CHOL 268* 04/12/2014 0424   TRIG 124 04/12/2014 0424   HDL 58 04/12/2014 0424   CHOLHDL 4.6 04/12/2014 0424   VLDL 25 04/12/2014 0424   LDLCALC 185* 04/12/2014 0424   Lab Results  Component Value Date   HGBA1C 8.2* 04/10/2014   No results found for: LABOPIA, COCAINSCRNUR, LABBENZ,  AMPHETMU, THCU, LABBARB  No results for input(s): ETH in the last 168 hours.  I have personally reviewed the radiological images below and agree with the radiology interpretations.  Dg Chest 2 View  04/09/2014   Enlargement of cardiac silhouette.  Tiny LEFT pleural effusion.     Mr Brain Wo Contrast  04/12/2014   1. Patchy acute infarct in the right hemisphere affecting the corona radiata and deep gray matter nuclei. No associated hemorrhage or mass effect. 2. 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  The infarct territory is in anterior choroidal artery.   MRA brain 1. Moderate to severe focal stenosis within the right P3 segment corresponds with the area of acute infarction. 2. Moderate diffuse small vessel disease, most prominent in the right PCA branch vessels, right MCA branch vessels, and bilateral ACA vessels. 3.  High-grade stenosis of the proximal left ACA and slightly more distal occlusion. 4. Mild M1 narrowing on the left is stable. 5. Mild to moderate proximal left A1 stenosis.  US Renal  04/10/2014    1. Increased renal cortical echogenicity raises concern for medical renal disease. 2. No evidence of hydronephrosis.  Bilateral ureteral jets are seen. 3. Mild bilateral perinephric fluid noted, nonspecific in nature.      Carotid Doppler    There is 1-39% bilateral ICA stenosis. Vertebral artery flow is antegrade.    2D Echocardiogram    Left ventricle: The cavity size was normal. Wall thickness was increased in a pattern of severe LVH. Systolic function wasmildly reduced. The estimated ejection fraction was in the rangeof 45% to 50%. Wall motion was normal; there were no regionalwall motion abnormalities. Doppler parameters are consistent with abnormal left ventricular relaxation (grade 1 diastolicdysfunction).  PHYSICAL EXAM  Temp:  [98.4 F (36.9 C)-98.6 F (37 C)] 98.4 F (36.9 C) (01/21 0951) Pulse Rate:  [71-74] 74 (01/21 0951) Resp:  [16-18] 18 (01/21 0951) BP: (115-149)/(66-81) 148/68 mmHg (01/21 0951) SpO2:  [98 %-99 %] 98 % (01/21 0951) Weight:  [147 lb 14.9 oz (67.1 kg)] 147 lb 14.9 oz (67.1 kg) (01/21 0500)  General - Well nourished, well developed, in no apparent distress.  Ophthalmologic - not able to see through due to right side glaucoma and not cooperative.  Cardiovascular - Regular rate and rhythm with no murmur.  Mental Status -  Level of arousal and orientation to time, place, and person were intact. Language including expression, naming, repetition, comprehension was assessed and found to have partial naming difficulty. Mild to moderate dysarthria. Fund of Knowledge was assessed and was intact.  Cranial Nerves II - XII - II - Visual field testing inconsistent, but showed left hemianopia with patchy visual field deficit at the right, may related to  combination of stroke and glaucoma. Decreased visual acuity bilaterally too. III, IV, VI - Extraocular movements intact. V - Facial sensation intact bilaterally. VII - left nasolabial fold flatening. VIII - Hearing & vestibular intact bilaterally. X - Palate elevates symmetrically, mild to moderate dysarthria. XI - Chin turning & shoulder shrug intact bilaterally. XII - Tongue protrusion intact.  Motor Strength - The patient's strength was 4+/5 right UE, but 0/5 left UE, right LE 3+/5, and LLE 2+/5 on pain stimulation.  Bulk was normal and fasciculations were absent.   Motor Tone - Muscle tone was increased bilateral LEs, normal UEs.  Reflexes - The patient's reflexes were increased at RLE and he had no pathological reflexes.  Sensory - Light touch, temperature/pinprick were assessed  and were normal.    Coordination - The patient had normal movements in the right hand with no ataxia or dysmetria.  Tremor was absent.  Gait and Station - not tested due to weakness.  ASSESSMENT/PLAN Mr. Braydn Carneiro is a 69 y.o. male with history of uncontrolled hypertension, diabetes mellitus, prior history of CVA 2008 at left corona radiata, acute on chronic kidney disease who was admitted for worsening swelling of his lower extremities, abdominal distention, overall generalized weakness. In hospital he developed LUE weakness. He did not receive IV t-PA due to unknown time of onset.   Stroke:  Non-dominant right anterior choroidal artery territory infarct secondary to large vessel atherosclerosis. His infarct location and symptoms with hemiparesis and hemianopia consistent with AChA stroke. His risk factor for stroke including uncontrolled HTN, DM, prior stroke, severe WM ischemic changes, CKD and HLD. cardioembolic AchA stroke less likely with many risk factors like these and also incomplete AChA infarct. However, afib needs to be ruled out by 30 day monitoring Hx L corona radiata infarct; known  intracranial atherosclerosis  Resultant  Left hemiparesis and left hemianopia  MRI  Right AChA territory infarct, severe small vessel disease   MRA pending (ordered)  Carotid Doppler  No significant stenosis   2D Echo EF 45-50%  Lovenox 30 mg sq daily for VTE prophylaxis  Diet renal/carb modified with 1200 ml fluid restriction thin liquids  no antithrombotic prior to admission, now on plavix.  Due to the anatomy of AchA, cardioembolic stroke not able to ruled out at this time. We recommend to have 30 day outpt cardiac event monitoring to rule out afib.   Patient counseled to be compliant with his antithrombotic medications  Ongoing aggressive stroke risk factor management  Therapy recommendations:  CIR  Disposition:  pending  Hypertension  Stable now  Home meds - amlodipine and lasix  Permissive hypertension (OK if <220/120) for 24-48 hours post stroke and then gradually normalized within 5-7 days.  Patient counseled to be compliant with his blood pressure medications  Hyperlipidemia  Home meds:  zocor 20 mg daily, resumed in hospital  LDL 185, goal < 70  Switched to lipitor 80mg   Continue statin at discharge  Diabetes  HgbA1c 8.2, not at goal < 7.0  Uncontrolled  On insulin  SSI  Close monitoring  CKD stage III  Nephrology on board  Likely due to DM and HTN  avoid hypotension with diuresis to avoid infarct extension.   Other Stroke Risk Factors  Advanced age  Hx stroke/TIA - 01/2007 L corona radiata infarct.  Other Active Problems  Elevated troponins, per cardiology not a cath candidate, possibly due to CKD  BPH  Chronic combined systolic and diastolic CHF  Hospital day # 5   Neurology will sign off. Please call with questions. Pt will follow up with Dr. Erlinda Hong at [facility] in about 2 months. Thanks for the consult.  Rosalin Hawking, MD PhD Stroke Neurology 04/14/2014 2:15 PM   To contact Stroke Continuity provider, please refer to  [email or web address] After hours, contact General Neurology

## 2014-04-14 NOTE — Progress Notes (Signed)
Right  Upper Extremity Vein Map    Cephalic  Segment Diameter Depth Comment  1. Axilla 2.31mm    2. Mid upper arm 2.66mm    3. Above [facility] 2.59mm    4. In [facility] 3.5mm    5. Below AC 2.59mm    6. Mid forearm 2.27mm    7. Wrist 1.33mm                    Basilic  Segment Diameter Depth Comment  1. Axilla 3.73mm 16.57mm   2. Mid upper arm 3.24mm 7.22mm   3. Above AC 4.28mm 2.70mm   4. In [facility] 1.79mm 1.78mm   5. Below AC 1.88mm 1.24mm   6. Mid forearm     7. Wrist                      Left Upper Extremity Vein Map    Cephalic  Segment Diameter Depth Comment  1. Axilla 2.55mm    2. Mid upper arm 2.7mm    3. Above [facility] 3.82mm  Thrombosed  4. In [facility] 3..31mm    5. Below AC   Unable to visualize.  6. Mid forearm   Unable to visualize.  7. Wrist   Unable to visualize.                  Basilic Unable to visualize.    04/14/2014 1:14 PM Maudry Mayhew, RVT, RDCS, RDMS

## 2014-04-14 NOTE — Care Management Note (Signed)
CARE MANAGEMENT NOTE 04/14/2014  Patient:  [name], [name]   Account Number:  [id number]  Date Initiated:  04/14/2014  Documentation initiated by:  Jasmine Pang  Subjective/Objective Assessment:   CM following for progression and d/c planning.     Action/Plan:   CM following for d/c needs, spoke with pt wife re needs, pt noncompliant per wife   Anticipated DC Date:  04/18/2014   Anticipated DC Plan:  [place]         Choice offered to / List presented to:             Status of service:   Medicare Important Message given?  YES (If response is "NO", the following Medicare IM given date fields will be blank) Date Medicare IM given:  04/14/2014 Medicare IM given by:  Josiyah Tozzi Date Additional Medicare IM given:   Additional Medicare IM given by:    Discharge Disposition:    Per UR Regulation:    If discussed at Long Length of Stay Meetings, dates discussed:    Comments:

## 2014-04-14 NOTE — Progress Notes (Signed)
Pt CBG 68, gave pt juice, pt asymptomatic to CBG.  Family at bedside, will recheck.

## 2014-04-14 NOTE — Progress Notes (Signed)
On appropriate CHF medications - no further invasive cardiac work-up at this time. Responding well to po diuretics, however, low GFR and likelihood of dialysis in the near future. Being considered for CIR.  Cardiology will sign-off. Continue current medications. Follow-up with Dr. Johnsie Cancel after discharge.  Pixie Casino, MD, [facility] Attending Cardiologist [place]

## 2014-04-14 NOTE — Progress Notes (Signed)
Speech Language Pathology Treatment: Dysphagia  Patient Details Name: [name] MRN: [id number] DOB: [birth year] Today's Date: 04/14/2014 Time: 6720-9470 SLP Time Calculation (min) (ACUTE ONLY): 19 min  Assessment / Plan / Recommendation Clinical Impression  Per RN and family, pt eating very well. No left pocketing reported. No overt s/s aspiration observed during po trials, or reported by family. RN reports pt continues to exhibit intermittent coughing. Family indicates cough not noted during po intake, and has improved. ST to continue to follow for diet tolerance, attention to task, and pt/family education.   HPI HPI: 69 year old male with uncontrolled hypertension, diabetes mellitus, prior history of CVA 2008, chronic kidney disease, has not seen any nephrologist presented to ED with worsening swelling of his lower extremities, abdominal distention, overall generalized weakness. Dx acute renal failure; new symptoms left sided weakness 1/19 MRI confirming acute right CVA (corona radiata, deep gray matter nuclei). Now NPO - prior diet renal/ carbohydrate modified with 1200 ml fluids   Pertinent Vitals Pain Assessment: No/denies pain  SLP Plan    tolerance of least restrictive diet.   Recommendations Diet recommendations: Regular;Thin liquid Liquids provided via: Cup;Straw Medication Administration: Whole meds with puree Supervision: Patient able to self feed;Intermittent supervision to cue for compensatory strategies Compensations: Slow rate;Small sips/bites;Check for pocketing Postural Changes and/or Swallow Maneuvers: Seated upright 90 degrees              Oral Care Recommendations: Oral care BID Follow up Recommendations: Inpatient Rehab    GO    Celia B. Quentin Ore [facility], CCC-SLP [phone]  Shonna Chock 04/14/2014, 2:37 PM

## 2014-04-14 NOTE — Clinical Social Work Note (Signed)
CSW and Janine Turnington, with inpatient rehab met again with [name] and her daughter [name] (by phone) regarding discharge options. Wife and daughter understand that CIR will not be able to accept patient due to insurance not paying for CIR then SNF for rehab. Wife was provided with SNF previously and daughter will be emailed the skilled facility list.   , MSW, LCSW Licensed Clinical Social Worker Clinical Social Work Department [place] [phone] 

## 2014-04-14 NOTE — Progress Notes (Signed)
Rehab admissions - I am following pt's case and was made aware from [place], Education officer, museum that pt's wife thought pt's insurance coverage was through [employer]. I got a copy of the card and contacted [place]. Pt was never enrolled in this policy and no premiums were ever paid. Pt does not have [place].  We did verify that pt does have [place] insurance. I also spoke with [facility] RN representative and [place] will not pay for both inpatient rehab and then a skilled nursing facility following inpatient rehab.   I then met with pt's wife, Lorriane Shire, Education officer, museum and pt's dtr Lola participated in our discussion through speaker phone in [place] office. Further questions were answered about pt's insurance coverage. I also explained that [place] will not pay for both inpatient rehab and then a skilled nursing facility following inpatient rehab.  From yesterday's conversation with the wife, it was again explained that rehab MD is projecting that pt will need minimal assistance at the end of a possible rehab stay. Wife stated that she cannot provide this help because she works. In addition, wife does not want the financial burden of having to pay out of pocket for SNF at the end of an inpatient rehab stay. Pt's wife and dtr had no further questions.  It is our recommendation that SNF be pursued in light of the above reasons and wife and dtr were agreeable to pursuing skilled nursing. Further questions about SNF search were addressed by Lorriane Shire.  Lorriane Shire will follow up with pt/family tomorrow. I will now sign off pt's case.  Thanks.  Nanetta Batty, PT Rehabilitation Admissions Coordinator [phone]

## 2014-04-14 NOTE — Progress Notes (Signed)
Occupational Therapy Treatment Patient Details Name: [name] MRN: [id number] DOB: Jun 29, 1945 Today's Date: 04/14/2014    History of present illness Adm 04/09/14 with h/o gradual progressive weakness and incr edema; +renal failure. 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 Pt able to safety sit EOB while working on balance and proximal stability with less extensor tone noted in trunk and LEs. Lateral transfers require +2 total assist with use of bed pad.  Pt self fed with moderate assist and verbal cues.  Instructed in compensatory strategies for locating people and items.  Follow Up Recommendations  CIR    Equipment Recommendations       Recommendations for Other Services      Precautions / Restrictions Precautions Precautions: Fall Precaution Comments: Lt shoulder at risk for subluxation Required Braces or Orthoses: Other Brace/Splint Other Brace/Splint: wears Rt AFO (post L CVA '08) Restrictions Weight Bearing Restrictions: No       Mobility Bed Mobility Overal bed mobility: Needs Assistance Bed Mobility: Supine to Sit     Supine to sit: Total assist;+2 for physical assistance;HOB elevated     General bed mobility comments: Pt was able to transition to EOB while utilizing helicopter technique. No extensor tone noted this session, and pt was able to sit with feet resting on the floor. Sat ~10 minutes EOB.   Transfers Overall transfer level: Needs assistance Equipment used: 2 person hand held assist Transfers: Lateral/Scoot Transfers          Lateral/Scoot Transfers: Total assist;+2 physical assistance General transfer comment: Bed pad used for scooting assist bed to drop-arm recliner. Pt anteriorly resting head on therapists shoulders for support, but otherwise did not  really participate in scoot transfer.     Balance Overall balance assessment: Needs assistance Sitting-balance support: Feet supported;Single extremity supported Sitting balance-[name]: Poor   Postural control: Posterior lean                         ADL Overall ADL's : Needs assistance/impaired Eating/Feeding: Moderate assistance;Sitting Eating/Feeding Details (indicate cue type and reason): Instructed in use of auditory and tactile information to locate food on plate.                                 Functional mobility during ADLs: +2 for physical assistance;Total assistance General ADL Comments: Instructed to turn head to locate MD on the L side and items on food tray.      Vision                     Perception     Praxis      Cognition   Behavior During Therapy: [facility] for tasks assessed/performed Overall Cognitive Status: Impaired/Different from baseline Area of Impairment: Attention;Following commands;Awareness;Problem solving   Current Attention Level: Selective    Following Commands: Follows one step commands inconsistently;Follows one step commands with increased time   Awareness: Emergent Problem Solving: Slow processing;Decreased initiation;Difficulty sequencing;Requires verbal cues;Requires tactile cues General Comments: Demonstrated ability to use auditory cues when self feeding as instructed.    Extremity/Trunk Assessment               Exercises     Shoulder  Instructions       General Comments      Pertinent Vitals/ Pain       Pain Assessment: No/denies pain  Home Living                                          Prior Functioning/Environment              Frequency Min 3X/week     Progress Toward Goals  OT Goals(current goals can now be found in the care plan section)  Progress towards OT goals: Progressing toward goals  Acute Rehab OT Goals Patient Stated Goal: agrees he  wants to get stronger  Plan Discharge plan remains appropriate    Co-evaluation    PT/OT/SLP Co-Evaluation/Treatment: Yes Reason for Co-Treatment: For patient/therapist safety PT goals addressed during session: Mobility/safety with mobility;Balance OT goals addressed during session: ADL's and self-care      End of Session Equipment Utilized During Treatment: Gait belt   Activity Tolerance Patient limited by fatigue   Patient Left in chair;with call bell/phone within reach;with nursing/sitter in room   Nurse Communication Mobility status        Time: 1324-4010 OT Time Calculation (min): 36 min  Charges: OT General Charges $OT Visit: 1 Procedure OT Treatments $Self Care/Home Management : 8-22 mins $Neuromuscular Re-education: 8-22 mins  Malka So 04/14/2014, 11:48 AM  [phone]

## 2014-04-14 NOTE — Progress Notes (Signed)
Patient ID: Darwin Rothlisberger  male  VOH:[id number]    DOB: [birth year]    DOA: 04/09/2014  PCP: Philis Fendt, MD  Interval History  Josimar Corning is a 69 yo male with PMH of HTN, type 2 DM, prior CVA (mild R hemiparesis deficits, not on antiplat), chronic kidney disease stage III, that presented to ED with worsening swelling of his lower extremities, abdominal distention, overall generalized weakness, and facial puffiness. He denied any sob, orthopnea, PND. Patient reported weakness and had a fall outside his house as he felt his legs were heavy and weak.  Also, he reports incontinence, has difficulty urinating, with urgency and frequency. In ED, creat 2.4, baseline 1.4 in May 2015, troponin +0.04, BNP 708.3, pt admitted for acute on chronic renal failure, and is followed by Nephrology. 04/12/14- pt with c/o's of left sided weakness and evidence of new CVA on MRI, neurology is following.  Assessment/Plan:  Acute renal failure on CK D stage III - W/ trace LE edema -Creatinine 3.09, baseline about  1.4 -US renal- no hydronephrosis/obstruction,chronic renal disease. -Nephrology on board- Continue oral Lasix 160mg  BID.  -Vein mapping 04/14/14 -Hep C AB + (need quant RNA).  SPEP, UPEP, ANCA, ANA negative.  -Continue foley, stric I& O- net neg 5.2 L since admission  Acute Right Hemisphere CVA -MRI brain-acute infarct in right hemisphere affecting corona radiata and deep gray matter nuclei -Neurology on board- ASA  325, stroke workup -Hgb A1c 8.2, LDL 185, 2-D echo-EF of 45-50%, severe LVH, grade 1 diastolic dysfunction, Carotid Dopplers -PT- CIR.  -MBS- Regular; thin liquid. -Tele-monitoring, frequent neuro checks  Elevated troponin:  -Troponin with mild elevation- likely secondary to renal disease -Cards on board-Not candidate for cath due to AKI.  -2D echo- EF 45-50%, severe LVH, w/o variations in wall motion -continue ASA, stain, carvedilol and amlodipine.  Diabetes mellitus  uncontrolled with renal complications - Hgb I9S 8.2 - Placed on sliding scale insulin, hemoglobin A1c 8.2, added meal coverage  Hypertension -BP controlled -Per cards- Continue Norvasc, carvedilol, PRN IV hydralazine with parameters.  Bidil if additional BP control needed.  Hyperlipidemia/Hypercholesterolemia - Lipid panel with LDL 185, Choles 268 - Continue Zocor  BPH -Continue Flomax  Hx of CVA -Deficits of minimal right sided weakness -On ASA 325 at home?   DVT Prophylaxis: SQ Lovenox  Code Status: FULL  Family Communication:  No family at bedside  Disposition: PT rec's CIR, wife wants SNF.  Unable to reach wife on phone for further discussion  Consultants:  Nephrology  Cardiology  Procedures:  Renal US  2D echo- 45-50%, severe LVH, mildly reduced systolic function, no wall motion normal  Antibiotics:  none   Subjective: Only nods to questions, follows commands  Objective: Weight change:   Intake/Output Summary (Last 24 hours) at 04/14/14 1312 Last data filed at 04/14/14 0454  Gross per 24 hour  Intake    120 ml  Output   1450 ml  Net  -1330 ml   Blood pressure 149/70, pulse 72, temperature 98.6 F (37 C), temperature source Axillary, resp. rate 16, height 5\' 5"  (1.651 m), weight 67.1 kg (147 lb 14.9 oz), SpO2 99 %.  Physical Exam: General: Alert and awake, oriented x3 male in NAD  CVS: S1-S2 clear, no murmur rubs or gallops Chest: clear to auscultation bilaterally, no wheezing, rales or rhonchi Abdomen: soft nontender, nondistended, normal bowel sounds  Extremities: No cyanosis, clubbing, trace edema noted bilaterally (significant improvement).   Neuro: Right upper and lower extremities  5/5, LUE 2/5, LLE 0/5  Lab Results: Basic Metabolic Panel:  Recent Labs Lab 04/13/14 0553 04/14/14 0915  NA 139 139  K 4.2 4.0  CL 104 103  CO2 24 26  GLUCOSE 226* 87  BUN 52* 55*  CREATININE 3.06* 3.09*  CALCIUM 7.9* 8.2*  PHOS 4.6  --    Liver  Function Tests:  Recent Labs Lab 04/09/14 1523 04/12/14 0424 04/13/14 0553  AST 42*  --   --   ALT 31  --   --   ALKPHOS 102  --   --   BILITOT 0.5  --   --   PROT 5.5*  --   --   ALBUMIN 1.8* 1.7* 1.5*   No results for input(s): LIPASE, AMYLASE in the last 168 hours. No results for input(s): AMMONIA in the last 168 hours. CBC:  Recent Labs Lab 04/09/14 1523  04/13/14 0553 04/14/14 0915  WBC 9.7  < > 8.0 6.9  NEUTROABS 7.6  --   --   --   HGB 11.9*  < > 10.0* 10.6*  HCT 35.0*  < > 29.2* 31.3*  MCV 89.7  < > 89.6 90.2  PLT 261  < > 240 297  < > = values in this interval not displayed. Cardiac Enzymes:  Recent Labs Lab 04/10/14 0938 04/10/14 1552 04/10/14 2002  TROPONINI 0.05* 0.05* 0.15*   BNP: Invalid input(s): POCBNP CBG:  Recent Labs Lab 04/13/14 1229 04/13/14 1651 04/13/14 2116 04/14/14 0754 04/14/14 1218  GLUCAP 233* 224* 133* 92 105*     Micro Results: Recent Results (from the past 240 hour(s))  Urine culture     Status: None   Collection Time: 04/10/14  6:15 AM  Result Value Ref Range Status   Specimen Description URINE, CLEAN CATCH  Final   Special Requests NONE  Final   Colony Count   Final    70,000 COLONIES/ML Performed at [facility]    Culture   Final    Multiple bacterial morphotypes present, none predominant. Suggest appropriate recollection if clinically indicated. Performed at [facility]    Report Status 04/11/2014 FINAL  Final    Studies/Results: Dg Chest 2 View  04/09/2014   CLINICAL DATA:  Weakness rete 3 days, leg swelling for few months, dry cough for 2 weeks, hypertension, diabetes  EXAM: CHEST  2 VIEW  COMPARISON:  08/18/2013  FINDINGS: Enlargement of cardiac silhouette.  Mediastinal contours and pulmonary vascularity normal.  Lungs clear.  Tiny LEFT pleural effusion.  No pneumothorax.  Bones unremarkable.  IMPRESSION: Enlargement of cardiac silhouette.  Tiny LEFT pleural effusion.   Electronically  Signed   By: Lavonia Dana M.D.   On: 04/09/2014 17:25   Mr Jodene Nam Head Wo Contrast  04/13/2014   CLINICAL DATA:  Acute nonhemorrhagic right PCA territory infarcts. Bilateral MCA stenosis.  EXAM: MRA HEAD WITHOUT CONTRAST  TECHNIQUE: Angiographic images of the Circle of Willis were obtained using MRA technique without intravenous contrast.  COMPARISON:  MRI brain 04/12/2014.  Cerebral arteriogram 02/04/2007  FINDINGS: Tortuosity of the cervical internal carotid arteries is similar to the prior exam. There is mild narrowing within the anterior genu of the cavernous left internal carotid artery. No significant ICA stenosis is present otherwise.  Signal loss within the proximal left anterior cerebral artery suggests the moderate stenosis of up to 70%. More mild distal left M1 stenosis is similar to the prior study, approximately 50% relative to the more distal vessel. There is severe  stenosis of a proximal inferior right M2 branch. The MCA bifurcations are otherwise intact. Mild to moderate distal small vessel attenuation is present bilaterally, worse on the right. The anterior communicating artery is patent. There is severe stenosis and proximal occlusion of the left A2 segment. Moderate irregularity is present in the more distal right a CA vessel.  The left vertebral artery is dominant vessel. The PICA origins are visualized and normal bilaterally. The basilar artery is within normal limits. There is mild narrowing of the proximal P1 segments bilaterally. New the moderate superior stenosis is present within the right P3 segment, corresponding to the areas of infarction. Less prominent attenuation is present within the left PCA branch vessels.  IMPRESSION: 1. Moderate to severe focal stenosis within the right P3 segment corresponds with the area of acute infarction. 2. Moderate diffuse small vessel disease, most prominent in the right PCA branch vessels, right MCA branch vessels, and bilateral ACA vessels. 3. High-grade  stenosis of the proximal left ACA and slightly more distal occlusion. 4. Mild M1 narrowing on the left is stable. 5. Mild to moderate proximal left A1 stenosis.   Electronically Signed   By: Lawrence Santiago M.D.   On: 04/13/2014 14:22   Mr Brain Wo Contrast  04/12/2014   CLINICAL DATA:  69 year old male with left side weakness. Uncontrolled hypertension and diabetes. Worsening extremity swelling. Initial encounter.  EXAM: MRI HEAD WITHOUT CONTRAST  TECHNIQUE: Multiplanar, multiecho pulse sequences of the brain and surrounding structures were obtained without intravenous contrast.  COMPARISON:  Brain MRI 02/02/2007.  Cerebral angiogram 02/04/2007.  FINDINGS: Confluent restricted diffusion tracking from the right corona radiata into the deep gray matter nuclei as well as the right temporal stem. The mesial right temporal lobe is spared. There is some involvement of the right thalamus.  Associated T2 and FLAIR hyperintensity. No associated hemorrhage or significant mass effect.  Major intracranial vascular flow voids are stable.  No contralateral or posterior fossa restricted diffusion. Cerebral volume has decreased since 2008. Mild ex vacuo ventricle enlargement has occurred. Mildly progressed and widespread Patchy and confluent cerebral white matter T2 and FLAIR hyperintensity. Occasional small areas of cortical encephalomalacia in both MCA territories. Progressed chronic T2 abnormality in the deep gray matter nuclei, especially the left thalamus. Increased T2 abnormality in the central on left pons. Cerebellum remains within normal limits.  No midline shift, mass effect, or evidence of intracranial mass lesion. No acute intracranial hemorrhage identified. Negative pituitary and cervicomedullary junction. Partially visible cervical spine degeneration. Bone marrow signal within normal limits. Visible internal auditory structures appear normal. Mastoids are clear. Mild paranasal sinus mucosal thickening is similar to  the prior exam. Visualized orbit soft tissues are within normal limits. Visualized scalp soft tissues are within normal limits.  IMPRESSION: 1. Patchy acute infarct in the right hemisphere affecting the corona radiata and deep gray matter nuclei. No associated hemorrhage or mass effect. 2. 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   Electronically Signed   By: Lars Pinks M.D.   On: 04/12/2014 09:26   US Renal  04/10/2014   CLINICAL DATA:  Acute renal failure.  Initial encounter.  EXAM: RENAL/URINARY TRACT ULTRASOUND COMPLETE  COMPARISON:  None.  FINDINGS: Right Kidney:  Length: 10.3 cm. Increased renal parenchymal echogenicity is noted. Mild right-sided perinephric fluid is noted. No mass or hydronephrosis visualized.  Left Kidney:  Length: 11.9 cm. Mildly increased renal parenchymal echogenicity is noted. Mild left-sided perinephric fluid is seen. No mass or  hydronephrosis visualized.  Bladder:  Minimal apparent echoes near the anterior bladder wall may be artifactual in nature. The bladder is otherwise unremarkable. Bilateral ureteral jets are visualized.  IMPRESSION: 1. Increased renal cortical echogenicity raises concern for medical renal disease. 2. No evidence of hydronephrosis.  Bilateral ureteral jets are seen. 3. Mild bilateral perinephric fluid noted, nonspecific in nature.   Electronically Signed   By: Garald Balding M.D.   On: 04/10/2014 04:31    Medications: Scheduled Meds: . amLODipine  10 mg Oral Daily  . atorvastatin  80 mg Oral q1800  . carvedilol  12.5 mg Oral BID WC  . clopidogrel  75 mg Oral Daily  . enoxaparin (LOVENOX) injection  30 mg Subcutaneous Q24H  . furosemide  160 mg Oral BID  . insulin aspart  0-5 Units Subcutaneous QHS  . insulin aspart  0-9 Units Subcutaneous TID WC  . insulin aspart  3 Units Subcutaneous TID WC  . insulin glargine  5 Units Subcutaneous QHS  . polyethylene glycol  17 g Oral Daily  . sodium chloride  3 mL Intravenous  Q12H  . tamsulosin  0.4 mg Oral Daily      LOS: 5 days    Lacy Duverney [facility] 04/14/2014, 1:12 PM Pager: [phone]  If 7PM-7AM, please contact night-coverage [email or web address] Password [email or web address]

## 2014-04-14 NOTE — Progress Notes (Signed)
PT in to see patient. Pt up to chair, feeding self.

## 2014-04-14 NOTE — Progress Notes (Signed)
[facility] Rounding Note Subjective:  Wife not in - still not clear to me if pt going to CIR vs SNF Access planning underway Vein mapping ordered Diuresing with po lasix (albeit more slowly) Net neg about a liter Objective Vital signs in last 24 hours: Filed Vitals:   04/13/14 1755 04/13/14 2110 04/14/14 0449 04/14/14 0500  BP: 121/81 115/66 149/70   Pulse: 72 71 72   Temp: 98.6 F (37 C) 98.5 F (36.9 C) 98.6 F (37 C)   TempSrc: Oral Oral Axillary   Resp: 17 16 16    Height:      Weight:    67.1 kg (147 lb 14.9 oz)  SpO2: 98% 98% 99%    Weight change:   Intake/Output Summary (Last 24 hours) at 04/14/14 1042 Last data filed at 04/14/14 0454  Gross per 24 hour  Intake    120 ml  Output   1450 ml  Net  -1330 ml   Weights  04/09/14/72.57 kg 04/05/14 67.1 kg  Physical Exam:  Blood pressure 137/70, pulse 69, temperature 97.4 F (36.3 C), temperature source Oral, resp. rate 17, height 5\' 5"  (1.651 m), weight 72.576 kg (160 lb), SpO2 95 %. Alert, no distress Mild jvd unchanged Chest decreased BS both bases otherwise clear Regular S1S2 No S3  Abd soft, NTND 2+ LE edema - some better but still some pitting to the hips; legs with some wrinkles now Left side weak/does not move arm    Recent Labs Lab 04/09/14 1523 04/10/14 0249 04/10/14 1552 04/11/14 0515 04/12/14 0424 04/13/14 0553 04/14/14 0915  NA 141 139  --  137 137 139 139  K 3.9 3.7  --  3.3* 4.1 4.2 4.0  CL 110 108  --  108 104 104 103  CO2 26 27  --  25 23 24 26   GLUCOSE 273* 284*  --  108* 170* 226* 87  BUN 34* 35*  --  37* 42* 52* 55*  CREATININE 2.40* 2.38*  --  2.57* 3.04* 3.06* 3.09*  CALCIUM 8.3* 8.2*  --  8.0* 8.0* 7.9* 8.2*  PHOS  --   --  5.0*  --  5.1* 4.6  --     Recent Labs Lab 04/09/14 1523 04/12/14 0424 04/13/14 0553  AST 42*  --   --   ALT 31  --   --   ALKPHOS 102  --   --   BILITOT 0.5  --   --   PROT 5.5*  --   --   ALBUMIN 1.8* 1.7* 1.5*    Recent  Labs Lab 04/09/14 1523 04/10/14 0249 04/13/14 0553 04/14/14 0915  WBC 9.7 10.1 8.0 6.9  NEUTROABS 7.6  --   --   --   HGB 11.9* 11.0* 10.0* 10.6*  HCT 35.0* 32.4* 29.2* 31.3*  MCV 89.7 89.3 89.6 90.2  PLT 261 238 240 297    Recent Labs Lab 04/09/14 2145 04/10/14 0249 04/10/14 0938 04/10/14 1552 04/10/14 2002  TROPONINI 0.08* 0.23* 0.05* 0.05* 0.15*   CBG:  Recent Labs Lab 04/13/14 0726 04/13/14 1229 04/13/14 1651 04/13/14 2116 04/14/14 0754  GLUCAP 202* 233* 224* 133* 92    Iron Studies: No results for input(s): IRON, TIBC, TRANSFERRIN, FERRITIN in the last 168 hours. Studies/Results: Mr Virgel Paling Wo Contrast  04/13/2014   CLINICAL DATA:  Acute nonhemorrhagic right PCA territory infarcts. Bilateral MCA stenosis.  EXAM: MRA HEAD WITHOUT CONTRAST  TECHNIQUE: Angiographic images of the Circle of Willis were obtained  using MRA technique without intravenous contrast.  COMPARISON:  MRI brain 04/12/2014.  Cerebral arteriogram 02/04/2007  FINDINGS: Tortuosity of the cervical internal carotid arteries is similar to the prior exam. There is mild narrowing within the anterior genu of the cavernous left internal carotid artery. No significant ICA stenosis is present otherwise.  Signal loss within the proximal left anterior cerebral artery suggests the moderate stenosis of up to 70%. More mild distal left M1 stenosis is similar to the prior study, approximately 50% relative to the more distal vessel. There is severe stenosis of a proximal inferior right M2 branch. The MCA bifurcations are otherwise intact. Mild to moderate distal small vessel attenuation is present bilaterally, worse on the right. The anterior communicating artery is patent. There is severe stenosis and proximal occlusion of the left A2 segment. Moderate irregularity is present in the more distal right a CA vessel.  The left vertebral artery is dominant vessel. The PICA origins are visualized and normal bilaterally. The  basilar artery is within normal limits. There is mild narrowing of the proximal P1 segments bilaterally. New the moderate superior stenosis is present within the right P3 segment, corresponding to the areas of infarction. Less prominent attenuation is present within the left PCA branch vessels.  IMPRESSION: 1. Moderate to severe focal stenosis within the right P3 segment corresponds with the area of acute infarction. 2. Moderate diffuse small vessel disease, most prominent in the right PCA branch vessels, right MCA branch vessels, and bilateral ACA vessels. 3. High-grade stenosis of the proximal left ACA and slightly more distal occlusion. 4. Mild M1 narrowing on the left is stable. 5. Mild to moderate proximal left A1 stenosis.   Electronically Signed   By: Lawrence Santiago M.D.   On: 04/13/2014 14:22   Medications:   . amLODipine  10 mg Oral Daily  . atorvastatin  80 mg Oral q1800  . carvedilol  12.5 mg Oral BID WC  . clopidogrel  75 mg Oral Daily  . enoxaparin (LOVENOX) injection  30 mg Subcutaneous Q24H  . furosemide  160 mg Oral BID  . insulin aspart  0-5 Units Subcutaneous QHS  . insulin aspart  0-9 Units Subcutaneous TID WC  . insulin aspart  3 Units Subcutaneous TID WC  . insulin glargine  5 Units Subcutaneous QHS  . polyethylene glycol  17 g Oral Daily  . sodium chloride  3 mL Intravenous Q12H  . tamsulosin  0.4 mg Oral Daily    I  have reviewed scheduled and prn medications.  Background 69 y.o. year-old with hx of HTN, DM2 and CVA and CKD (creatinine 0.8 2008, 1.49 07/2013) presented with general weakness, inability to get up and swelling in face and extremities. Found to have creatinine of 2.4 and was admitted for renal failure and anasarca with low albumin. Was on diclofenac scheduled bid, ACEi , metformin - all held.    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 Creatinine has risen some with diuresis as expected - but stable at around 3 past 72 hours. On po lasix with adequate UOP right now, neg balance, weight down some, exam improved. Continue current dosing. Still with a lot of edema. Proceeding with access planning - vein  mapping first, then will ask [facility] to see.  Patient does wish to eventually go ahead with dialysis when the time comes.  Nephrotic syndrome - as above. Diabetic nephropathy.  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 Inpt rehab recommended. Unclear to me what the long term plan is  Elevated troponin. EF 45-50% No CP or cardiac sx. Not good candidate for ACE with advanced CKD  DM - per primary  HTN - meds  HLD statin  Anemia - no ESA indicated at this time but Hb has dropped to 10. Check iron studies. Aranesp if drops below 10.  Hep C+ -  Viral load [id number].  Will need hepatology input to address if needs treatment.   Jamal Maes, MD [facility] [phone] pager 04/14/2014, 10:42 AM

## 2014-04-15 LAB — GLUCOSE, CAPILLARY
GLUCOSE-CAPILLARY: 145 mg/dL — AB (ref 70–99)
GLUCOSE-CAPILLARY: 60 mg/dL — AB (ref 70–99)
GLUCOSE-CAPILLARY: 204 mg/dL — AB (ref 70–99)
GLUCOSE-CAPILLARY: 155 mg/dL — AB (ref 70–99)
Glucose-Capillary: 85 mg/dL (ref 70–99)

## 2014-04-15 LAB — IRON AND TIBC
IRON: 33 ug/dL — AB (ref 42–165)
Saturation Ratios: 16 % — ABNORMAL LOW (ref 20–55)
TIBC: 203 ug/dL — ABNORMAL LOW (ref 215–435)
UIBC: 170 ug/dL (ref 125–400)

## 2014-04-15 LAB — RENAL FUNCTION PANEL
ALBUMIN: 1.6 g/dL — AB (ref 3.5–5.2)
ANION GAP: 10 (ref 5–15)
BUN: 55 mg/dL — AB (ref 6–23)
CHLORIDE: 99 meq/L (ref 96–112)
CO2: 30 mmol/L (ref 19–32)
CREATININE: 3 mg/dL — AB (ref 0.50–1.35)
Calcium: 8.1 mg/dL — ABNORMAL LOW (ref 8.4–10.5)
GFR, EST AFRICAN AMERICAN: 23 mL/min — AB (ref >90)
GFR, EST NON AFRICAN AMERICAN: 20 mL/min — AB (ref >90)
GLUCOSE: 147 mg/dL — AB (ref 70–99)
Phosphorus: 4.3 mg/dL (ref 2.3–4.6)
Potassium: 3.9 mmol/L (ref 3.5–5.1)
Sodium: 139 mmol/L (ref 135–145)

## 2014-04-15 LAB — CBC
HCT: 32.4 % — ABNORMAL LOW (ref 39.0–52.0)
HEMOGLOBIN: 10.8 g/dL — AB (ref 13.0–17.0)
MCH: 30.5 pg (ref 26.0–34.0)
MCHC: 33.3 g/dL (ref 30.0–36.0)
MCV: 91.5 fL (ref 78.0–100.0)
PLATELETS: 292 10*3/uL (ref 150–400)
RBC: 3.54 MIL/uL — ABNORMAL LOW (ref 4.22–5.81)
RDW: 12.9 % (ref 11.5–15.5)
WBC: 5.7 10*3/uL (ref 4.0–10.5)

## 2014-04-15 MED ORDER — BISACODYL 10 MG RE SUPP
10.0000 mg | Freq: Every day | RECTAL | Status: DC | PRN
  Administered 2014-04-15: 10 mg via RECTAL
  Filled 2014-04-15: qty 1

## 2014-04-15 MED ORDER — SODIUM CHLORIDE 0.9 % IV SOLN
510.0000 mg | Freq: Once | INTRAVENOUS | Status: AC
  Administered 2014-04-15: 510 mg via INTRAVENOUS
  Filled 2014-04-15: qty 17

## 2014-04-15 NOTE — Progress Notes (Signed)
UR completed Abdias Hickam K. Forestine Macho, RN, BSN, [place], CCM  04/15/2014 2:08 PM

## 2014-04-15 NOTE — Progress Notes (Signed)
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 He denied any sob, orthopnea, PND. Patient reported weakness and had a fall outside his house as he felt his legs were heavy and weak.  Also, he reports incontinence, has difficulty urinating, with urgency and frequency. In ED, creat 2.4, baseline 1.4 in May 2015, troponin +0.04, BNP 708.3, pt admitted for acute on chronic renal failure, and is followed by Nephrology. 04/12/14- pt with c/o's of left sided weakness and evidence of new CVA on MRI, neurology is following.  Assessment/Plan:  Acute renal failure on CK D stage III -Creatinine 3.00, baseline about  1.4 -US renal- no hydronephrosis/obstruction,chronic renal disease. -Nephrology on board- Continue oral Lasix 160mg  BID.  -Vein mapping 04/14/14; will likely have [facility] see this admission. - SPEP, UPEP, ANCA, ANA negative.  -Hep C Ab +, viral load [id number]; will need GI follow up as an OP to assess whether treatment is needed. -Continue foley, stric I& O- net neg 7.9 L since admission.  Acute Right Hemisphere CVA -MRI brain-acute infarct in right hemisphere affecting corona radiata and deep gray matter nuclei -Neurology on board- ASA  325, stroke workup -Hgb A1c 8.2, LDL 185, 2-D  -ECHO:Study Conclusions:  Left ventricle: The cavity size was normal. Wall thickness was increased in a pattern of severe LVH. Systolic function was mildly reduced. The estimated ejection fraction was in the range of 45% to 50%. Wall motion was normal; there were no regional wall motion  abnormalities. Doppler parameters are consistent with abnormal left ventricular relaxation (grade 1 diastolic dysfunction). -Carotid Dopplers: 1-39% ICA stenosis. Vertebral artery flow is antegrade.   -PT consult recommends CIR, but patient will be going to SNF given insurance issues. -MBS- Regular; thin liquid. -Tele-monitoring, frequent neuro checks -Neuro consult recommends a 30 day monitor which can be arranged at time of DC.  Elevated troponin:  -Troponin with mild elevation- likely secondary to renal disease -Cards on board-Not candidate for cath due to AKI.  -2D echo- EF 45-50%, severe LVH, w/o variations in wall motion -continue ASA, stain, carvedilol and amlodipine.  Diabetes mellitus uncontrolled with renal complications - Hgb Y6A 8.2 -Fair control. -Continue to monitor and adjust treatment as necessary.  Hypertension -fair control. -Continue Norvasc, carvedilol, PRN IV hydralazine with parameters.   -Will allow permissive HTN in light of recent CVA.  Hyperlipidemia/Hypercholesterolemia - Lipid panel with LDL 185, Choles 268 - Continue Zocor  BPH -Continue Flomax  DVT Prophylaxis: SQ Lovenox  Code Status: FULL  Family Communication:  No family at bedside  Disposition: To SNF once ready.  Consultants:  Nephrology  Cardiology  Procedures:  Renal US  2D echo  Antibiotics:  none   Subjective: Says he doesn't feel well but cannot specify, just shakes his heads. Can follow commands.  Objective: Weight change: -0.149 kg (-5.3 oz)  Intake/Output Summary (Last 24 hours) at 04/15/14 1006 Last data filed at 04/15/14 0639  Gross per 24 hour  Intake    240 ml  Output   2850 ml  Net  -2610 ml   Blood pressure 146/83, pulse 70,  temperature 98.5 F (36.9 C), temperature source Oral, resp. rate 17, height 5\' 5"  (1.651 m), weight 67.6 kg (149 lb 0.5 oz), SpO2 100 %.  Physical Exam: General: Alert and awake, oriented x3 male in NAD  CVS: S1-S2  clear, no murmur rubs or gallops Chest: clear to auscultation bilaterally, no wheezing, rales or rhonchi Abdomen: soft nontender, nondistended, normal bowel sounds  Extremities: No cyanosis, clubbing, 2+edema bilaterally  Lab Results: Basic Metabolic Panel:  Recent Labs Lab 04/14/14 0915 04/15/14 0427  NA 139 139  K 4.0 3.9  CL 103 99  CO2 26 30  GLUCOSE 87 147*  BUN 55* 55*  CREATININE 3.09* 3.00*  CALCIUM 8.2* 8.1*  PHOS  --  4.3   Liver Function Tests:  Recent Labs Lab 04/09/14 1523  04/13/14 0553 04/15/14 0427  AST 42*  --   --   --   ALT 31  --   --   --   ALKPHOS 102  --   --   --   BILITOT 0.5  --   --   --   PROT 5.5*  --   --   --   ALBUMIN 1.8*  < > 1.5* 1.6*  < > = values in this interval not displayed. No results for input(s): LIPASE, AMYLASE in the last 168 hours. No results for input(s): AMMONIA in the last 168 hours. CBC:  Recent Labs Lab 04/09/14 1523  04/14/14 0915 04/15/14 0427  WBC 9.7  < > 6.9 5.7  NEUTROABS 7.6  --   --   --   HGB 11.9*  < > 10.6* 10.8*  HCT 35.0*  < > 31.3* 32.4*  MCV 89.7  < > 90.2 91.5  PLT 261  < > 297 292  < > = values in this interval not displayed. Cardiac Enzymes:  Recent Labs Lab 04/10/14 0938 04/10/14 1552 04/10/14 2002  TROPONINI 0.05* 0.05* 0.15*   BNP: Invalid input(s): POCBNP CBG:  Recent Labs Lab 04/14/14 1218 04/14/14 1627 04/14/14 1859 04/14/14 2053 04/15/14 0743  GLUCAP 105* 69* 155* 163* 145*     Micro Results: Recent Results (from the past 240 hour(s))  Urine culture     Status: None   Collection Time: 04/10/14  6:15 AM  Result Value Ref Range Status   Specimen Description URINE, CLEAN CATCH  Final   Special Requests NONE  Final   Colony Count   Final    70,000 COLONIES/ML Performed at [facility]    Culture   Final    Multiple bacterial morphotypes present, none predominant. Suggest appropriate recollection if clinically indicated. Performed at [facility]    Report Status 04/11/2014 FINAL  Final    Studies/Results: Dg Chest 2 View  04/09/2014   CLINICAL DATA:  Weakness rete 3 days, leg swelling for few months, dry cough for 2 weeks, hypertension, diabetes  EXAM: CHEST  2 VIEW  COMPARISON:  08/18/2013  FINDINGS: Enlargement of cardiac silhouette.  Mediastinal contours and pulmonary vascularity normal.  Lungs clear.  Tiny LEFT pleural effusion.  No pneumothorax.  Bones unremarkable.  IMPRESSION: Enlargement of cardiac silhouette.  Tiny LEFT pleural effusion.   Electronically Signed   By: Lavonia Dana M.D.   On: 04/09/2014 17:25   Mr Jodene Nam Head Wo Contrast  04/13/2014   CLINICAL DATA:  Acute nonhemorrhagic right PCA territory infarcts. Bilateral MCA stenosis.  EXAM: MRA HEAD WITHOUT CONTRAST  TECHNIQUE: Angiographic images of the Circle of Willis were obtained using  MRA technique without intravenous contrast.  COMPARISON:  MRI brain 04/12/2014.  Cerebral arteriogram 02/04/2007  FINDINGS: Tortuosity of the cervical internal carotid arteries is similar to the prior exam. There is mild narrowing within the anterior genu of the cavernous left internal carotid artery. No significant ICA stenosis is present otherwise.  Signal loss within the proximal left anterior cerebral artery suggests the moderate stenosis of up to 70%. More mild distal left M1 stenosis is similar to the prior study, approximately 50% relative to the more distal vessel. There is severe stenosis of a proximal inferior right M2 branch. The MCA bifurcations are otherwise intact. Mild to moderate distal small vessel attenuation is present bilaterally, worse on the right. The anterior communicating artery is patent. There is severe stenosis and proximal occlusion of the left A2 segment. Moderate irregularity is present in the more distal right a CA vessel.  The left vertebral artery is dominant vessel. The PICA origins are visualized and normal bilaterally. The basilar artery is within normal  limits. There is mild narrowing of the proximal P1 segments bilaterally. New the moderate superior stenosis is present within the right P3 segment, corresponding to the areas of infarction. Less prominent attenuation is present within the left PCA branch vessels.  IMPRESSION: 1. Moderate to severe focal stenosis within the right P3 segment corresponds with the area of acute infarction. 2. Moderate diffuse small vessel disease, most prominent in the right PCA branch vessels, right MCA branch vessels, and bilateral ACA vessels. 3. High-grade stenosis of the proximal left ACA and slightly more distal occlusion. 4. Mild M1 narrowing on the left is stable. 5. Mild to moderate proximal left A1 stenosis.   Electronically Signed   By: Lawrence Santiago M.D.   On: 04/13/2014 14:22   Mr Brain Wo Contrast  04/12/2014   CLINICAL DATA:  69 year old male with left side weakness. Uncontrolled hypertension and diabetes. Worsening extremity swelling. Initial encounter.  EXAM: MRI HEAD WITHOUT CONTRAST  TECHNIQUE: Multiplanar, multiecho pulse sequences of the brain and surrounding structures were obtained without intravenous contrast.  COMPARISON:  Brain MRI 02/02/2007.  Cerebral angiogram 02/04/2007.  FINDINGS: Confluent restricted diffusion tracking from the right corona radiata into the deep gray matter nuclei as well as the right temporal stem. The mesial right temporal lobe is spared. There is some involvement of the right thalamus.  Associated T2 and FLAIR hyperintensity. No associated hemorrhage or significant mass effect.  Major intracranial vascular flow voids are stable.  No contralateral or posterior fossa restricted diffusion. Cerebral volume has decreased since 2008. Mild ex vacuo ventricle enlargement has occurred. Mildly progressed and widespread Patchy and confluent cerebral white matter T2 and FLAIR hyperintensity. Occasional small areas of cortical encephalomalacia in both MCA territories. Progressed chronic T2  abnormality in the deep gray matter nuclei, especially the left thalamus. Increased T2 abnormality in the central on left pons. Cerebellum remains within normal limits.  No midline shift, mass effect, or evidence of intracranial mass lesion. No acute intracranial hemorrhage identified. Negative pituitary and cervicomedullary junction. Partially visible cervical spine degeneration. Bone marrow signal within normal limits. Visible internal auditory structures appear normal. Mastoids are clear. Mild paranasal sinus mucosal thickening is similar to the prior exam. Visualized orbit soft tissues are within normal limits. Visualized scalp soft tissues are within normal limits.  IMPRESSION: 1. Patchy acute infarct in the right hemisphere affecting the corona radiata and deep gray matter nuclei. No associated hemorrhage or mass effect. 2. Underlying severe chronic small vessel disease with progression  since 2008.   Electronically Signed   By: Lars Pinks M.D.   On: 04/12/2014 09:26   US Renal  04/10/2014   CLINICAL DATA:  Acute renal failure.  Initial encounter.  EXAM: RENAL/URINARY TRACT ULTRASOUND COMPLETE  COMPARISON:  None.  FINDINGS: Right Kidney:  Length: 10.3 cm. Increased renal parenchymal echogenicity is noted. Mild right-sided perinephric fluid is noted. No mass or hydronephrosis visualized.  Left Kidney:  Length: 11.9 cm. Mildly increased renal parenchymal echogenicity is noted. Mild left-sided perinephric fluid is seen. No mass or hydronephrosis visualized.  Bladder:  Minimal apparent echoes near the anterior bladder wall may be artifactual in nature. The bladder is otherwise unremarkable. Bilateral ureteral jets are visualized.  IMPRESSION: 1. Increased renal cortical echogenicity raises concern for medical renal disease. 2. No evidence of hydronephrosis.  Bilateral ureteral jets are seen. 3. Mild bilateral perinephric fluid noted, nonspecific in nature.   Electronically Signed   By: Garald Balding M.D.   On:  04/10/2014 04:31    Medications: Scheduled Meds: . amLODipine  10 mg Oral Daily  . atorvastatin  80 mg Oral q1800  . carvedilol  12.5 mg Oral BID WC  . clopidogrel  75 mg Oral Daily  . enoxaparin (LOVENOX) injection  30 mg Subcutaneous Q24H  . furosemide  160 mg Oral BID  . insulin aspart  0-5 Units Subcutaneous QHS  . insulin aspart  0-9 Units Subcutaneous TID WC  . insulin aspart  3 Units Subcutaneous TID WC  . insulin glargine  5 Units Subcutaneous QHS  . polyethylene glycol  17 g Oral Daily  . sodium chloride  3 mL Intravenous Q12H  . tamsulosin  0.4 mg Oral Daily   Time Spent: 35 minutes   LOS: 6 days    HERNANDEZ ACOSTA,Kyre Jeffries  [facility] 04/15/2014, 10:06 AM Pager: [phone]  If 7PM-7AM, please contact night-coverage [email or web address] Password [email or web address]

## 2014-04-15 NOTE — Progress Notes (Signed)
[facility] Rounding Note Subjective:  Disposition still unclear - appears now may be SNF Diuresing well now and creatinine has plateau'd at 3 Says thinks he feels a little better  Objective Vital signs in last 24 hours: Filed Vitals:   04/15/14 0500 04/15/14 0603 04/15/14 0639 04/15/14 0938  BP:  167/76  146/83  Pulse:  79  70  Temp:  99.1 F (37.3 C)  98.5 F (36.9 C)  TempSrc:  Oral  Oral  Resp:  18  17  Height:      Weight: 66.9 kg (147 lb 7.8 oz)  67.6 kg (149 lb 0.5 oz)   SpO2:  96%  100%   Weight change: -0.149 kg (-5.3 oz)  Intake/Output Summary (Last 24 hours) at 04/15/14 1408 Last data filed at 04/15/14 1150  Gross per 24 hour  Intake    480 ml  Output   2850 ml  Net  -2370 ml   Weights  04/09/14/72.57 kg 04/14/14 67.1 kg 04/15/65.6 kg   Physical Exam:  Blood pressure 137/70, pulse 69, temperature 97.4 F (36.3 C), temperature source Oral, resp. rate 17, height 5\' 5"  (1.651 m), weight 72.576 kg (160 lb), SpO2 95 %. Alert, no distress lying in bed Mild JVD but lying flat so appropriate Chest clear Regular S1S2 No S3  Abd soft, NTND 1+ LE edema - some better but still some pitting to the hips; legs with some wrinkles now Not really moving left side    Recent Labs Lab 04/09/14 1523 04/10/14 0249 04/10/14 1552 04/11/14 0515 04/12/14 0424 04/13/14 0553 04/14/14 0915 04/15/14 0427  NA 141 139  --  137 137 139 139 139  K 3.9 3.7  --  3.3* 4.1 4.2 4.0 3.9  CL 110 108  --  108 104 104 103 99  CO2 26 27  --  25 23 24 26 30   GLUCOSE 273* 284*  --  108* 170* 226* 87 147*  BUN 34* 35*  --  37* 42* 52* 55* 55*  CREATININE 2.40* 2.38*  --  2.57* 3.04* 3.06* 3.09* 3.00*  CALCIUM 8.3* 8.2*  --  8.0* 8.0* 7.9* 8.2* 8.1*  PHOS  --   --  5.0*  --  5.1* 4.6  --  4.3    Recent Labs Lab 04/09/14 1523 04/12/14 0424 04/13/14 0553 04/15/14 0427  AST 42*  --   --   --   ALT 31  --   --   --   ALKPHOS 102  --   --   --   BILITOT 0.5  --   --    --   PROT 5.5*  --   --   --   ALBUMIN 1.8* 1.7* 1.5* 1.6*    Recent Labs Lab 04/09/14 1523 04/10/14 0249 04/13/14 0553 04/14/14 0915 04/15/14 0427  WBC 9.7 10.1 8.0 6.9 5.7  NEUTROABS 7.6  --   --   --   --   HGB 11.9* 11.0* 10.0* 10.6* 10.8*  HCT 35.0* 32.4* 29.2* 31.3* 32.4*  MCV 89.7 89.3 89.6 90.2 91.5  PLT 261 238 240 297 292    Recent Labs Lab 04/09/14 2145 04/10/14 0249 04/10/14 0938 04/10/14 1552 04/10/14 2002  TROPONINI 0.08* 0.23* 0.05* 0.05* 0.15*   CBG:  Recent Labs Lab 04/14/14 1859 04/14/14 2053 04/15/14 0743 04/15/14 1141 04/15/14 1159  GLUCAP 155* 163* 145* 60* 85    Iron Studies:   Recent Labs Lab 04/15/14 0427  IRON 33*  TIBC  203*  TSat 17%  Medications:   . amLODipine  10 mg Oral Daily  . atorvastatin  80 mg Oral q1800  . carvedilol  12.5 mg Oral BID WC  . clopidogrel  75 mg Oral Daily  . enoxaparin (LOVENOX) injection  30 mg Subcutaneous Q24H  . furosemide  160 mg Oral BID  . insulin aspart  0-5 Units Subcutaneous QHS  . insulin aspart  0-9 Units Subcutaneous TID WC  . insulin aspart  3 Units Subcutaneous TID WC  . insulin glargine  5 Units Subcutaneous QHS  . polyethylene glycol  17 g Oral Daily  . sodium chloride  3 mL Intravenous Q12H  . tamsulosin  0.4 mg Oral Daily    I  have reviewed scheduled and prn medications.  Background 69 y.o. year-old with hx of HTN, DM2 and CVA and CKD (creatinine 0.8 2008, 1.49 07/2013) presented with general weakness, inability to get up and swelling in face and extremities. Found to have creatinine of 2.4 and was admitted for renal failure and anasarca with low albumin. Was on diclofenac scheduled bid, ACEi , metformin - all held.    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 Creatinine increased some with diuresis as expected - but stable at around 3 past 72 hours. On po lasix with good uop, edema improvin.. Vein mapping done. Will contact [facility] but not mandatory that access be done in the hospital - we could set up outpt evaluation.    Patient does wish to eventually go ahead with dialysis when the time comes.  Nephrotic syndrome - as above. Diabetic nephropathy.  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 Probable SNF  Elevated troponin. EF 45-50% No CP or cardiac sx. Not good candidate for ACE with advanced CKD  DM - per primary  HTN - meds  HLD statin  Anemia - no ESA indicated at this time  Aranesp if drops below 10. Tsat low at 16 - give dose of feraheme  Hep C+ -  Viral load [id number].  Will need hepatology input to address if needs treatment.   Matthew Maes, MD [facility] [phone] pager 04/15/2014, 2:08 PM

## 2014-04-15 NOTE — Progress Notes (Signed)
Paged Dr. Jerilee Hoh, pt co constipation, miralax ineffective, may pt have dulcolax suppository.

## 2014-04-15 NOTE — Progress Notes (Signed)
Pt CBG 60, pt asymptomatic to CBG, gave juice and snack.

## 2014-04-15 NOTE — Progress Notes (Addendum)
Physical Therapy Treatment Patient Details Name: [name] MRN: [id number] DOB: 1945-05-26 Today's Date: 04/15/2014    History of Present Illness Adm 04/09/14 with h/o gradual progressive weakness and incr edema; +renal failure. 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 Pt was able to perform transfer to/from EOB with total assist +2. Pt demonstrating increased extensor tone this session in LE's and trunk. Pt was able to tolerate sitting EOB for short time (<2 minutes) due to trunk extension. Transfer to chair was deferred as pt is at increased risk of sliding out of the chair at this time. Overall pt very anxious and restless today. Pt's focus is on having a bowel movement. Will continue to follow.    Follow Up Recommendations  SNF;Supervision/Assistance - 24 hour     Equipment Recommendations  Wheelchair (measurements PT);Wheelchair cushion (measurements PT);3in1 (PT);Other (comment)    Recommendations for Other Services       Precautions / Restrictions Precautions Precautions: Fall Precaution Comments: Lt shoulder at risk for subluxation Required Braces or Orthoses: Other Brace/Splint Other Brace/Splint: wears Rt AFO (post L CVA '08) Restrictions Weight Bearing Restrictions: No    Mobility  Bed Mobility Overal bed mobility: Needs Assistance Bed Mobility: Rolling;Supine to Sit;Sit to Supine Rolling: Total assist   Supine to sit: Total assist;+2 for physical assistance;HOB elevated Sit to supine: Total assist;+2 for physical assistance   General bed mobility comments: Roll Lt with max assist partially due to Lt visual field cut and difficulty finding Lt side/rail/etc; roll Rt total assist with RUE using Rt rail. Pt was able to transition to/from EOB with helicopter technique. While sitting pt  demonstrated increased extensor tone bilaterally (R>L) and trunk extension as well.   Transfers                 General transfer comment: Transfer OOB not attempted today for safety as pt was demonstrating increased extensor tone. Pt at risk for sliding out of chair.   Ambulation/Gait                 Stairs            Wheelchair Mobility    Modified Rankin (Stroke Patients Only)       Balance Overall balance assessment: Needs assistance Sitting-balance support: Feet supported;Single extremity supported Sitting balance-[name]: Zero Sitting balance - Comments: Max assist for trunk control, and noted extensor tone bilaterally (R>L) Postural control: Posterior lean                          Cognition Arousal/Alertness: Awake/alert Behavior During Therapy: WFL for tasks assessed/performed Overall Cognitive Status: Impaired/Different from baseline Area of Impairment: Attention;Following commands;Awareness;Problem solving   Current Attention Level: Selective   Following Commands: Follows one step commands inconsistently;Follows one step commands with increased time   Awareness: Emergent Problem Solving: Slow processing;Decreased initiation;Difficulty sequencing;Requires verbal cues;Requires tactile cues General Comments: Demonstrated ability to use auditory cues when self feeding as instructed.    Exercises General Exercises - Upper Extremity Shoulder Flexion: 5 reps;AROM    General Comments General comments (skin integrity, edema, etc.): Pt appears very anxious and restless today. Beating on the mattress and rubbing his head frantically at times. When asked what was wrong, pt reports he is anxious and needs to have  a bowel movement.       Pertinent Vitals/Pain Pain Assessment: No/denies pain    Home Living                      Prior Function            PT Goals (current goals can now be found in the care plan section) Acute  Rehab PT Goals Patient Stated Goal: Get out of the bed PT Goal Formulation: With patient Time For Goal Achievement: 04/26/14 Potential to Achieve Goals: Good Progress towards PT goals: Progressing toward goals    Frequency  Min 3X/week    PT Plan Current plan remains appropriate    Co-evaluation             End of Session   Activity Tolerance: Patient limited by fatigue;Other (comment) (Limited by anxiety) Patient left: in bed;with call bell/phone within reach;with nursing/sitter in room     Time: 0751-0812 PT Time Calculation (min) (ACUTE ONLY): 21 min  Charges:  $Therapeutic Activity: 8-22 mins                    G Codes:      Rolinda Roan 04-24-2014, 8:26 AM   Rolinda Roan, PT, DPT Acute Rehabilitation Services Pager: [phone]

## 2014-04-16 DIAGNOSIS — N183 Chronic kidney disease, stage 3 (moderate): Secondary | ICD-10-CM

## 2014-04-16 LAB — RENAL FUNCTION PANEL
ALBUMIN: 1.7 g/dL — AB (ref 3.5–5.2)
Anion gap: 10 (ref 5–15)
BUN: 53 mg/dL — AB (ref 6–23)
CO2: 28 mmol/L (ref 19–32)
CREATININE: 2.76 mg/dL — AB (ref 0.50–1.35)
Calcium: 8.2 mg/dL — ABNORMAL LOW (ref 8.4–10.5)
Chloride: 96 mmol/L (ref 96–112)
GFR, EST AFRICAN AMERICAN: 26 mL/min — AB (ref >90)
GFR, EST NON AFRICAN AMERICAN: 22 mL/min — AB (ref >90)
Glucose, Bld: 117 mg/dL — ABNORMAL HIGH (ref 70–99)
POTASSIUM: 4 mmol/L (ref 3.5–5.1)
Phosphorus: 4.4 mg/dL (ref 2.3–4.6)
SODIUM: 134 mmol/L — AB (ref 135–145)

## 2014-04-16 LAB — GLUCOSE, CAPILLARY
GLUCOSE-CAPILLARY: 174 mg/dL — AB (ref 70–99)
GLUCOSE-CAPILLARY: 60 mg/dL — AB (ref 70–99)
GLUCOSE-CAPILLARY: 64 mg/dL — AB (ref 70–99)
GLUCOSE-CAPILLARY: 69 mg/dL — AB (ref 70–99)
GLUCOSE-CAPILLARY: 68 mg/dL — AB (ref 70–99)
GLUCOSE-CAPILLARY: 80 mg/dL (ref 70–99)
Glucose-Capillary: 107 mg/dL — ABNORMAL HIGH (ref 70–99)
Glucose-Capillary: 75 mg/dL (ref 70–99)

## 2014-04-16 MED ORDER — FUROSEMIDE 80 MG PO TABS
120.0000 mg | ORAL_TABLET | Freq: Two times a day (BID) | ORAL | Status: DC
  Administered 2014-04-16 – 2014-04-18 (×5): 120 mg via ORAL
  Filled 2014-04-16 (×6): qty 1

## 2014-04-16 NOTE — Consult Note (Signed)
Referred by:  Dr. Lorrene Reid  Reason for referral: New access  History of Present Illness  [name] is a 69 y.o. (05-15-1945) male who presented to hospital with left side weakness.  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  We are asked to see this patient for chronic kidney disease stage III and permanent access placement by Nephrology.  The patient is right hand dominant.  The patient has not had previous access procedures.  Previous central venous cannulation procedures include: none.  The patient has never had a PPM placed.  Conversation was somewhat limited.  Past Medical History  Diagnosis Date  . Hypertension   . Hypercholesterolemia   . Diabetes mellitus without complication   . Glaucoma   . Stroke     a. 01/2007 L corona radiata infarct.  . Cerebrovascular disease     a. 01/2007 MRI/A: L MCA 73-53%, R MCA 29%, LICA 92%.  . CKD (chronic kidney disease), stage III   . Chronic combined systolic and diastolic CHF, NYHA class 2     a. 01/2007 Echo: EF 60-65%;  b. 03/2014 Echo: EF 45-50%, Gr 1 DD, severe LVH.  Marland Kitchen Headache     Past Surgical History  Procedure Laterality Date  . Colonoscopy w/ biopsies and polypectomy    . Colon surgery    . Trabeculectomy Left 08/18/2013    Procedure: TRABECULECTOMY WITH TUBE WITH [facility];  Surgeon: [name], MD;  Location: [place];  Service: Ophthalmology;  Laterality: Left;    History   Social History  . Marital Status: Married    Spouse Name: N/A    Number of Children: N/A  . Years of Education: N/A   Occupational History  . Not on file.   Social History Main Topics  . Smoking status: Never Smoker   . Smokeless tobacco: Never Used  . Alcohol Use: No  . Drug Use: No  . Sexual Activity: Not on file   Other Topics Concern  . Not on file   Social History Narrative    Family History  Problem Relation Age of Onset  . Other      Negative for premature CAD.   No current facility-administered  medications on file prior to encounter.   Current Outpatient Prescriptions on File Prior to Encounter  Medication Sig Dispense Refill  . amLODipine (NORVASC) 5 MG tablet Take 5 mg by mouth daily.    . diclofenac (VOLTAREN) 25 MG EC tablet Take 25 mg by mouth 2 (two) times daily.    . diclofenac sodium (VOLTAREN) 1 % GEL Apply 2 g topically 4 (four) times daily.    . metFORMIN (GLUCOPHAGE) 1000 MG tablet Take 1,000 mg by mouth 2 (two) times daily with a meal.    . quinapril (ACCUPRIL) 40 MG tablet Take 40 mg by mouth at bedtime.    . simvastatin (ZOCOR) 20 MG tablet Take 20 mg by mouth daily.    . tamsulosin (FLOMAX) 0.4 MG CAPS capsule Take 0.4 mg by mouth.      No Known Allergies   REVIEW OF SYSTEMS:  (Positives checked otherwise negative)  CARDIOVASCULAR:  []  chest pain, []  chest pressure, []  palpitations, []  shortness of breath when laying flat, []  shortness of breath with exertion,  []  pain in feet when walking, []  pain in feet when laying flat, []  history of blood clot in veins (DVT), []  history of phlebitis, []  swelling in legs, []  varicose veins  PULMONARY:  []  productive  cough, []  asthma, []  wheezing  NEUROLOGIC:  [x]  weakness in arms or legs, []  numbness in arms or legs, []  difficulty speaking or slurred speech, [x]  visual field loss, []  dizziness  HEMATOLOGIC:  []  bleeding problems, []  problems with blood clotting too easily  MUSCULOSKEL:  []  joint pain, []  joint swelling  GASTROINTEST:  []  vomiting blood, []  blood in stool     GENITOURINARY:  []  burning with urination, []  blood in urine  PSYCHIATRIC:  []  history of major depression  INTEGUMENTARY:  []  rashes, []  ulcers  CONSTITUTIONAL:  []  fever, []  chills  Physical Examination  Filed Vitals:   04/15/14 0639 04/15/14 0938 04/15/14 2132 04/16/14 0451  BP:  146/83 148/73 150/74  Pulse:  70 72 64  Temp:  98.5 F (36.9 C) 98.8 F (37.1 C) 98.3 F (36.8 C)  TempSrc:  Oral Oral Oral  Resp:  17 18 17   Height:       Weight: 149 lb 0.5 oz (67.6 kg)  150 lb 3.2 oz (68.13 kg)   SpO2:  100% 98% 100%   Body mass index is 24.99 kg/(m^2).  General: A&O x 3, WD, ill appear,   Head: [place]/AT  Ear/Nose/Throat: Hearing grossly intact, nares w/o erythema or drainage, oropharynx w/o Erythema/Exudate, Mallampati score: 3  Eyes: PERRLA, EOMI  Neck: Supple, no nuchal rigidity, no palpable LAD  Pulmonary: Sym exp, good air movt, CTAB, no rales, rhonchi, & wheezing  Cardiac: RRR, Nl S1, S2, no Murmurs, rubs or gallops  Vascular: Vessel Right Left  Radial Palpable Palpable  Ulnar Not Palpable Not Palpable  Brachial Faintly Palpable Faintly Palpable  Carotid Palpable, without bruit Palpable, without bruit  Aorta Not palpable N/A  Femoral Palpable Palpable  Popliteal Not palpable Not palpable  PT Not Palpable Not Palpable  DP Not Palpable Not Palpable   Gastrointestinal: soft, NTND, -G/R, - HSM, - masses, - CVAT B  Musculoskeletal: RUE and RLE: 4/5, LUE and LLE: 0/5, Extremities without ischemic changes , IV in R antecubitum  Neurologic: CN 2-12 intact , Pain and light touch intact in extremities , Motor exam as listed above  Psychiatric: Judgment intact, Mood & affect appropriate for pt's clinical situation, flat affect  Dermatologic: See M/S exam for extremity exam, no rashes otherwise noted  Lymph : No Cervical, Axillary, or Inguinal lymphadenopathy    Non-Invasive Vascular Imaging  Vein Mapping  (Date: 04/16/2014):   R arm: acceptable vein conduits include basilic and cephalic  L arm: acceptable vein conduits include none, basilic not visualized  Laboratory: CBC:    Component Value Date/Time   WBC 5.7 04/15/2014 0427   RBC 3.54* 04/15/2014 0427   HGB 10.8* 04/15/2014 0427   HCT 32.4* 04/15/2014 0427   PLT 292 04/15/2014 0427   MCV 91.5 04/15/2014 0427   MCH 30.5 04/15/2014 0427   MCHC 33.3 04/15/2014 0427   RDW 12.9 04/15/2014 0427   LYMPHSABS 1.4 04/09/2014 1523   MONOABS 0.6  04/09/2014 1523   EOSABS 0.0 04/09/2014 1523   BASOSABS 0.0 04/09/2014 1523    BMP:    Component Value Date/Time   NA 134* 04/16/2014 0358   K 4.0 04/16/2014 0358   CL 96 04/16/2014 0358   CO2 28 04/16/2014 0358   GLUCOSE 117* 04/16/2014 0358   BUN 53* 04/16/2014 0358   CREATININE 2.76* 04/16/2014 0358   CALCIUM 8.2* 04/16/2014 0358   GFRNONAA 22* 04/16/2014 0358   GFRAA 26* 04/16/2014 0358    Coagulation: Lab Results  Component Value  Date   INR 1.0 02/01/2007   No results found for: PTT    Medical Decision Making  Constantino Starace is a 69 y.o. male who presents with chronic kidney disease stage III    No surgery planned in after acute CVA for >1 month  Would re-evaluate with repeat vein mapping at his follow up appointment as his L arm vein mapping was incomplete .  Based on vein mapping and examination, his current permanent access options include: R BC AVF, R staged BVT. Repeat vein mapping might demonstrate other options also.  Additionally, an contracture in the L arm due to his CVA may limit the value of any L arm fistula.  Thank you for the consultation.  Will plan on seeing him in one month.  Adele Barthel, MD [facility] Office: [phone] Pager: [phone]  04/16/2014, 10:03 AM

## 2014-04-16 NOTE — Progress Notes (Signed)
[facility] PROGRESS NOTE   [name]  male  WUJ:[id number]    DOB: 11-May-1945    DOA: 04/09/2014  PCP: Philis Fendt, MD  Interval History  Grayland Daisey is a 69 yo male with PMH of HTN, type 2 DM, prior CVA (mild R hemiparesis deficits, not on antiplat), chronic kidney disease stage III, that presented to ED with worsening swelling of his lower extremities, abdominal distention, overall generalized weakness, and facial puffiness. He denied any sob, orthopnea, PND. Patient reported weakness and had a fall outside his house as he felt his legs were heavy and weak.  Also, he reports incontinence, has difficulty urinating, with urgency and frequency. In ED, creat 2.4, baseline 1.4 in May 2015, troponin +0.04, BNP 708.3, pt admitted for acute on chronic renal failure, and is followed by Nephrology. 04/12/14- pt with c/o's of left sided weakness and evidence of new CVA on MRI, neurology is following.  Assessment/Plan:  Acute renal failure on CK D stage III -Creatinine continues to fall to 2.7, baseline about  1.4 -US renal- no hydronephrosis/obstruction,chronic renal disease. -Nephrology on board- Continue oral Lasix 160mg  BID.  -Vein mapping 04/14/14; as per Dr. Bridgett Larsson, to follow up in about 1 month for fistula placement. - SPEP, UPEP, ANCA, ANA negative.  -Hep C Ab +, viral load [id number]; will need GI follow up as an OP to assess whether treatment is needed. -Continue foley, stric I& O- net neg 9.6 L since admission.  Acute Right Hemisphere CVA -MRI brain-acute infarct in right hemisphere affecting corona radiata and deep gray matter nuclei -Neurology on board- ASA  325, stroke workup -Hgb A1c 8.2, LDL 185, 2-D  -ECHO:Study Conclusions:  Left ventricle: The cavity size was normal. Wall thickness was increased in a pattern of severe LVH. Systolic function was mildly reduced. The estimated ejection fraction was in the range of 45% to 50%. Wall motion was  normal; there were no regional wall motion abnormalities. Doppler parameters are consistent with abnormal left ventricular relaxation (grade 1 diastolic dysfunction). -Carotid Dopplers: 1-39% ICA stenosis. Vertebral artery flow is antegrade.   -PT consult recommends CIR, but patient will be going to SNF given insurance issues. -MBS- Regular; thin liquid. -Tele-monitoring, frequent neuro checks -Neuro consult recommends a 30 day monitor which can be arranged at time of DC.  Elevated troponin:  -Troponin with mild elevation- likely secondary to renal disease -Cards on board-Not candidate for cath due to AKI.  -2D echo- EF 45-50%, severe LVH, w/o variations in wall motion -continue ASA, stain, carvedilol and amlodipine.  Diabetes mellitus uncontrolled with renal complications - Hgb N5A 8.2 -Fair control. -Continue to monitor and adjust treatment as necessary.  Hypertension -fair control. -Continue Norvasc, carvedilol, PRN IV hydralazine with parameters.   -Will allow permissive HTN in light of recent CVA.  Hyperlipidemia/Hypercholesterolemia - Lipid panel with LDL 185, Choles 268 - Continue Zocor  BPH -Continue Flomax  DVT Prophylaxis: SQ Lovenox  Code Status: FULL  Family Communication:  No family at bedside  Disposition: To SNF.  Consultants:  Nephrology  Cardiology  Procedures:  Renal US  2D echo  Antibiotics:  none   Subjective: Says he doesn't feel well but cannot specify, just shakes his heads. Can follow commands.  Objective: Weight change: 1.179 kg (2 lb 9.6 oz)  Intake/Output Summary (Last 24 hours) at 04/16/14 1612 Last data filed at 04/16/14 1426  Gross per 24 hour  Intake    480 ml  Output   2450 ml  Net  -1970 ml   Blood pressure 132/69, pulse 60, temperature 97.5 F (36.4 C), temperature source Oral, resp. rate 16, height 5\' 5"  (1.651 m), weight 68.13 kg (150 lb 3.2 oz), SpO2 94 %.  Physical Exam: General: Alert and awake,  oriented x3 male in NAD  CVS: S1-S2 clear, no murmur rubs or gallops Chest: clear to auscultation bilaterally, no wheezing, rales or rhonchi Abdomen: soft nontender, nondistended, normal bowel sounds  Extremities: No cyanosis, clubbing, 2+edema bilaterally  Lab Results: Basic Metabolic Panel:  Recent Labs Lab 04/15/14 0427 04/16/14 0358  NA 139 134*  K 3.9 4.0  CL 99 96  CO2 30 28  GLUCOSE 147* 117*  BUN 55* 53*  CREATININE 3.00* 2.76*  CALCIUM 8.1* 8.2*  PHOS 4.3 4.4   Liver Function Tests:  Recent Labs Lab 04/15/14 0427 04/16/14 0358  ALBUMIN 1.6* 1.7*   No results for input(s): LIPASE, AMYLASE in the last 168 hours. No results for input(s): AMMONIA in the last 168 hours. CBC:  Recent Labs Lab 04/14/14 0915 04/15/14 0427  WBC 6.9 5.7  HGB 10.6* 10.8*  HCT 31.3* 32.4*  MCV 90.2 91.5  PLT 297 292   Cardiac Enzymes:  Recent Labs Lab 04/10/14 0938 04/10/14 1552 04/10/14 2002  TROPONINI 0.05* 0.05* 0.15*   BNP: Invalid input(s): POCBNP CBG:  Recent Labs Lab 04/15/14 1159 04/15/14 1700 04/15/14 2127 04/16/14 0724 04/16/14 1125  GLUCAP 85 204* 155* 107* 174*     Micro Results: Recent Results (from the past 240 hour(s))  Urine culture     Status: None   Collection Time: 04/10/14  6:15 AM  Result Value Ref Range Status   Specimen Description URINE, CLEAN CATCH  Final   Special Requests NONE  Final   Colony Count   Final    70,000 COLONIES/ML Performed at [facility]    Culture   Final    Multiple bacterial morphotypes present, none predominant. Suggest appropriate recollection if clinically indicated. Performed at [facility]    Report Status 04/11/2014 FINAL  Final    Studies/Results: Dg Chest 2 View  04/09/2014   CLINICAL DATA:  Weakness rete 3 days, leg swelling for few months, dry cough for 2 weeks, hypertension, diabetes  EXAM: CHEST  2 VIEW  COMPARISON:  08/18/2013  FINDINGS: Enlargement of cardiac silhouette.   Mediastinal contours and pulmonary vascularity normal.  Lungs clear.  Tiny LEFT pleural effusion.  No pneumothorax.  Bones unremarkable.  IMPRESSION: Enlargement of cardiac silhouette.  Tiny LEFT pleural effusion.   Electronically Signed   By: Lavonia Dana M.D.   On: 04/09/2014 17:25   Mr Jodene Nam Head Wo Contrast  04/13/2014   CLINICAL DATA:  Acute nonhemorrhagic right PCA territory infarcts. Bilateral MCA stenosis.  EXAM: MRA HEAD WITHOUT CONTRAST  TECHNIQUE: Angiographic images of the Circle of Willis were obtained using MRA technique without intravenous contrast.  COMPARISON:  MRI brain 04/12/2014.  Cerebral arteriogram 02/04/2007  FINDINGS: Tortuosity of the cervical internal carotid arteries is similar to the prior exam. There is mild narrowing within the anterior genu of the cavernous left internal carotid artery. No significant ICA stenosis is present otherwise.  Signal loss within the proximal left anterior cerebral artery suggests the moderate stenosis of up to 70%. More mild distal left M1 stenosis is similar to the prior study, approximately 50% relative to the more distal vessel. There is severe stenosis of a proximal inferior right M2 branch. The MCA bifurcations are otherwise intact. Mild to  moderate distal small vessel attenuation is present bilaterally, worse on the right. The anterior communicating artery is patent. There is severe stenosis and proximal occlusion of the left A2 segment. Moderate irregularity is present in the more distal right a CA vessel.  The left vertebral artery is dominant vessel. The PICA origins are visualized and normal bilaterally. The basilar artery is within normal limits. There is mild narrowing of the proximal P1 segments bilaterally. New the moderate superior stenosis is present within the right P3 segment, corresponding to the areas of infarction. Less prominent attenuation is present within the left PCA branch vessels.  IMPRESSION: 1. Moderate to severe focal stenosis  within the right P3 segment corresponds with the area of acute infarction. 2. Moderate diffuse small vessel disease, most prominent in the right PCA branch vessels, right MCA branch vessels, and bilateral ACA vessels. 3. High-grade stenosis of the proximal left ACA and slightly more distal occlusion. 4. Mild M1 narrowing on the left is stable. 5. Mild to moderate proximal left A1 stenosis.   Electronically Signed   By: Lawrence Santiago M.D.   On: 04/13/2014 14:22   Mr Brain Wo Contrast  04/12/2014   CLINICAL DATA:  69 year old male with left side weakness. Uncontrolled hypertension and diabetes. Worsening extremity swelling. Initial encounter.  EXAM: MRI HEAD WITHOUT CONTRAST  TECHNIQUE: Multiplanar, multiecho pulse sequences of the brain and surrounding structures were obtained without intravenous contrast.  COMPARISON:  Brain MRI 02/02/2007.  Cerebral angiogram 02/04/2007.  FINDINGS: Confluent restricted diffusion tracking from the right corona radiata into the deep gray matter nuclei as well as the right temporal stem. The mesial right temporal lobe is spared. There is some involvement of the right thalamus.  Associated T2 and FLAIR hyperintensity. No associated hemorrhage or significant mass effect.  Major intracranial vascular flow voids are stable.  No contralateral or posterior fossa restricted diffusion. Cerebral volume has decreased since 2008. Mild ex vacuo ventricle enlargement has occurred. Mildly progressed and widespread Patchy and confluent cerebral white matter T2 and FLAIR hyperintensity. Occasional small areas of cortical encephalomalacia in both MCA territories. Progressed chronic T2 abnormality in the deep gray matter nuclei, especially the left thalamus. Increased T2 abnormality in the central on left pons. Cerebellum remains within normal limits.  No midline shift, mass effect, or evidence of intracranial mass lesion. No acute intracranial hemorrhage identified. Negative pituitary and  cervicomedullary junction. Partially visible cervical spine degeneration. Bone marrow signal within normal limits. Visible internal auditory structures appear normal. Mastoids are clear. Mild paranasal sinus mucosal thickening is similar to the prior exam. Visualized orbit soft tissues are within normal limits. Visualized scalp soft tissues are within normal limits.  IMPRESSION: 1. Patchy acute infarct in the right hemisphere affecting the corona radiata and deep gray matter nuclei. No associated hemorrhage or mass effect. 2. 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   Electronically Signed   By: Lars Pinks M.D.   On: 04/12/2014 09:26   US Renal  04/10/2014   CLINICAL DATA:  Acute renal failure.  Initial encounter.  EXAM: RENAL/URINARY TRACT ULTRASOUND COMPLETE  COMPARISON:  None.  FINDINGS: Right Kidney:  Length: 10.3 cm. Increased renal parenchymal echogenicity is noted. Mild right-sided perinephric fluid is noted. No mass or hydronephrosis visualized.  Left Kidney:  Length: 11.9 cm. Mildly increased renal parenchymal echogenicity is noted. Mild left-sided perinephric fluid is seen. No mass or hydronephrosis visualized.  Bladder:  Minimal apparent echoes near the anterior bladder wall may be artifactual  in nature. The bladder is otherwise unremarkable. Bilateral ureteral jets are visualized.  IMPRESSION: 1. Increased renal cortical echogenicity raises concern for medical renal disease. 2. No evidence of hydronephrosis.  Bilateral ureteral jets are seen. 3. Mild bilateral perinephric fluid noted, nonspecific in nature.   Electronically Signed   By: Garald Balding M.D.   On: 04/10/2014 04:31    Medications: Scheduled Meds: . amLODipine  10 mg Oral Daily  . atorvastatin  80 mg Oral q1800  . carvedilol  12.5 mg Oral BID WC  . clopidogrel  75 mg Oral Daily  . enoxaparin (LOVENOX) injection  30 mg Subcutaneous Q24H  . furosemide  120 mg Oral BID  . insulin aspart  0-5 Units  Subcutaneous QHS  . insulin aspart  0-9 Units Subcutaneous TID WC  . insulin aspart  3 Units Subcutaneous TID WC  . insulin glargine  5 Units Subcutaneous QHS  . polyethylene glycol  17 g Oral Daily  . sodium chloride  3 mL Intravenous Q12H  . tamsulosin  0.4 mg Oral Daily   Time Spent: 25 minutes   LOS: 7 days    HERNANDEZ ACOSTA,ESTELA  [facility] 04/16/2014, 4:12 PM Pager: [phone]  If 7PM-7AM, please contact night-coverage [email or web address] Password [email or web address]

## 2014-04-16 NOTE — Progress Notes (Signed)
[facility] Rounding Note Subjective:  Disposition still unclear - appears now may be SNF - says he isn't sure yet Says thinks he feels a little better, not SOB, left sided weakness no better Diuresing nicely, edema better, and renal function sl improved Dr. Bridgett Larsson has seen and recommends outpt followup for access once at least a month out from his stroke  Objective Vital signs in last 24 hours: Filed Vitals:   04/15/14 0938 04/15/14 2132 04/16/14 0451 04/16/14 1128  BP: 146/83 148/73 150/74 132/69  Pulse: 70 72 64 60  Temp: 98.5 F (36.9 C) 98.8 F (37.1 C) 98.3 F (36.8 C) 97.5 F (36.4 C)  TempSrc: Oral Oral Oral Oral  Resp: 17 18 17 16   Height:      Weight:  68.13 kg (150 lb 3.2 oz)    SpO2: 100% 98% 100% 94%   Weight change: 1.179 kg (2 lb 9.6 oz)  Intake/Output Summary (Last 24 hours) at 04/16/14 1253 Last data filed at 04/16/14 0900  Gross per 24 hour  Intake    240 ml  Output   1850 ml  Net  -1610 ml   Weights  04/09/14/72.57 kg 04/14/14 67.1 kg 04/15/65.6 kg   Physical Examination BP 132/69 mmHg  Pulse 60  Temp(Src) 97.5 F (36.4 C) (Oral)  Resp 16  Ht 5\' 5"  (1.651 m)  Wt 68.13 kg (150 lb 3.2 oz)  BMI 24.99 kg/m2  SpO2 94%  Alert, no distress lying in bed No JVD Chest clear without crackles or wheezes Regular S1S2 No S3  Abd soft, NTND Trace edema LE's Not really moving left side   Recent Labs Lab 04/10/14 0249 04/10/14 1552 04/11/14 0515 04/12/14 0424 04/13/14 0553 04/14/14 0915 04/15/14 0427 04/16/14 0358  NA 139  --  137 137 139 139 139 134*  K 3.7  --  3.3* 4.1 4.2 4.0 3.9 4.0  CL 108  --  108 104 104 103 99 96  CO2 27  --  25 23 24 26 30 28   GLUCOSE 284*  --  108* 170* 226* 87 147* 117*  BUN 35*  --  37* 42* 52* 55* 55* 53*  CREATININE 2.38*  --  2.57* 3.04* 3.06* 3.09* 3.00* 2.76*  CALCIUM 8.2*  --  8.0* 8.0* 7.9* 8.2* 8.1* 8.2*  PHOS  --  5.0*  --  5.1* 4.6  --  4.3 4.4    Recent Labs Lab 04/09/14 1523   04/13/14 0553 04/15/14 0427 04/16/14 0358  AST 42*  --   --   --   --   ALT 31  --   --   --   --   ALKPHOS 102  --   --   --   --   BILITOT 0.5  --   --   --   --   PROT 5.5*  --   --   --   --   ALBUMIN 1.8*  < > 1.5* 1.6* 1.7*  < > = values in this interval not displayed.  Recent Labs Lab 04/09/14 1523 04/10/14 0249 04/13/14 0553 04/14/14 0915 04/15/14 0427  WBC 9.7 10.1 8.0 6.9 5.7  NEUTROABS 7.6  --   --   --   --   HGB 11.9* 11.0* 10.0* 10.6* 10.8*  HCT 35.0* 32.4* 29.2* 31.3* 32.4*  MCV 89.7 89.3 89.6 90.2 91.5  PLT 261 238 240 297 292    Recent Labs Lab 04/09/14 2145 04/10/14 0249 04/10/14 0938 04/10/14  1552 04/10/14 2002  TROPONINI 0.08* 0.23* 0.05* 0.05* 0.15*   CBG:  Recent Labs Lab 04/15/14 1159 04/15/14 1700 04/15/14 2127 04/16/14 0724 04/16/14 1125  GLUCAP 85 204* 155* 107* 174*    Iron Studies:   Recent Labs Lab 04/15/14 0427  IRON 33*  TIBC 203*  TSat 17%  Medications:   . amLODipine  10 mg Oral Daily  . atorvastatin  80 mg Oral q1800  . carvedilol  12.5 mg Oral BID WC  . clopidogrel  75 mg Oral Daily  . enoxaparin (LOVENOX) injection  30 mg Subcutaneous Q24H  . furosemide  160 mg Oral BID  . insulin aspart  0-5 Units Subcutaneous QHS  . insulin aspart  0-9 Units Subcutaneous TID WC  . insulin aspart  3 Units Subcutaneous TID WC  . insulin glargine  5 Units Subcutaneous QHS  . polyethylene glycol  17 g Oral Daily  . sodium chloride  3 mL Intravenous Q12H  . tamsulosin  0.4 mg Oral Daily    I  have reviewed scheduled and prn medications.  Background 69 y.o. year-old with hx of HTN, DM2 and CVA and CKD (creatinine 0.8 2008, 1.49 07/2013) presented with general weakness, inability to get up and swelling in face and extremities. Found to have creatinine of 2.4 and was admitted for renal failure and anasarca with low albumin. Was on diclofenac scheduled bid, ACEi , metformin - all held. Workup revealed nephrotic syndrome most  likely on the basis of his DM.   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 Creatinine increased some with diuresis as expected - has now started to fall  - from 3 to 2.7 today. On po lasix 160 BID with good uop, edema improving. I think can back lasix down to 120 BID.  [facility] (Dr. Bridgett Larsson) has seen - no permanent access until at least a month out from his stroke.   Patient does wish to eventually go ahead with dialysis when the time comes. He will need followup with with me once discharged to become established CKD patient in our practice.    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 No intervention required at this time  Nephrotic syndrome - as above. Diabetic nephropathy.  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 Probable SNF for rehab  Elevated troponin. EF 45-50% No CP or cardiac sx. Not good candidate for ACE with advanced CKD  DM - per primary  HTN - meds  HLD statin  Anemia - no ESA indicated at this time  Aranesp if drops below 10. Tsat low at 16 - s/p Feraheme on 1/22  Hep C+ -  Viral load [id number].  Will need hepatology input to address if needs treatment.   Jamal Maes, MD [facility] [phone] pager 04/16/2014, 12:53 PM

## 2014-04-17 ENCOUNTER — Other Ambulatory Visit: Payer: Self-pay | Admitting: *Deleted

## 2014-04-17 DIAGNOSIS — Z01810 Encounter for preprocedural cardiovascular examination: Secondary | ICD-10-CM

## 2014-04-17 DIAGNOSIS — N185 Chronic kidney disease, stage 5: Secondary | ICD-10-CM

## 2014-04-17 LAB — RENAL FUNCTION PANEL
Albumin: 1.7 g/dL — ABNORMAL LOW (ref 3.5–5.2)
Anion gap: 9 (ref 5–15)
BUN: 59 mg/dL — ABNORMAL HIGH (ref 6–23)
CO2: 29 mmol/L (ref 19–32)
Calcium: 7.8 mg/dL — ABNORMAL LOW (ref 8.4–10.5)
Chloride: 91 mmol/L — ABNORMAL LOW (ref 96–112)
Creatinine, Ser: 2.95 mg/dL — ABNORMAL HIGH (ref 0.50–1.35)
GFR calc non Af Amer: 21 mL/min — ABNORMAL LOW (ref >90)
GFR, EST AFRICAN AMERICAN: 24 mL/min — AB (ref >90)
GLUCOSE: 237 mg/dL — AB (ref 70–99)
PHOSPHORUS: 4.9 mg/dL — AB (ref 2.3–4.6)
Potassium: 4 mmol/L (ref 3.5–5.1)
Sodium: 129 mmol/L — ABNORMAL LOW (ref 135–145)

## 2014-04-17 LAB — GLUCOSE, CAPILLARY
Glucose-Capillary: 257 mg/dL — ABNORMAL HIGH (ref 70–99)
Glucose-Capillary: 181 mg/dL — ABNORMAL HIGH (ref 70–99)
Glucose-Capillary: 58 mg/dL — ABNORMAL LOW (ref 70–99)
Glucose-Capillary: 94 mg/dL (ref 70–99)
Glucose-Capillary: 158 mg/dL — ABNORMAL HIGH (ref 70–99)

## 2014-04-17 MED ORDER — HYDROCOD POLST-CHLORPHEN POLST 10-8 MG/5ML PO LQCR: 5.0000 mL | Freq: Two times a day (BID) | ORAL | Status: DC | PRN

## 2014-04-17 MED ORDER — CETYLPYRIDINIUM CHLORIDE 0.05 % MT LIQD
7.0000 mL | Freq: Two times a day (BID) | OROMUCOSAL | Status: DC
  Administered 2014-04-17 – 2014-04-18 (×3): 7 mL via OROMUCOSAL

## 2014-04-17 NOTE — Progress Notes (Signed)
[facility] Rounding Note Subjective:  Disposition still unclear - appears now may be SNF - says he isn't sure yet Moving his left hand/arm more today Wife not in yet  Objective Vital signs in last 24 hours: Filed Vitals:   04/16/14 1611 04/16/14 2108 04/17/14 0424 04/17/14 0959  BP: 132/63 127/64 146/66 123/68  Pulse: 76 65 67 70  Temp: 98.2 F (36.8 C) 98 F (36.7 C) 97.5 F (36.4 C) 98.4 F (36.9 C)  TempSrc: Oral Axillary Axillary Oral  Resp: 16 16 16 17   Height:      Weight:  68.7 kg (151 lb 7.3 oz)    SpO2: 94% 100% 97% 94%   Weight change: 0.57 kg (1 lb 4.1 oz)  Intake/Output Summary (Last 24 hours) at 04/17/14 1113 Last data filed at 04/17/14 1056  Gross per 24 hour  Intake   1080 ml  Output   1950 ml  Net   -870 ml   Physical Examination BP 123/68 mmHg  Pulse 70  Temp(Src) 98.4 F (36.9 C) (Oral)  Resp 17  Ht 5\' 5"  (1.651 m)  Wt 68.7 kg (151 lb 7.3 oz)  BMI 25.20 kg/m2  SpO2 94%  Alert, no distress lying in bed Pleasant, smiling No JVD Chest clear without crackles or wheezes Regular S1S2 No S3  Abd soft, NTND Liver edge not felt Trace edema LE's Able to squeeze with his left hand   Recent Labs Lab 04/10/14 1552 04/11/14 0515 04/12/14 0424 04/13/14 0553 04/14/14 0915 04/15/14 0427 04/16/14 0358 04/17/14 0541  NA  --  137 137 139 139 139 134* 129*  K  --  3.3* 4.1 4.2 4.0 3.9 4.0 4.0  CL  --  108 104 104 103 99 96 91*  CO2  --  25 23 24 26 30 28 29   GLUCOSE  --  108* 170* 226* 87 147* 117* 237*  BUN  --  37* 42* 52* 55* 55* 53* 59*  CREATININE  --  2.57* 3.04* 3.06* 3.09* 3.00* 2.76* 2.95*  CALCIUM  --  8.0* 8.0* 7.9* 8.2* 8.1* 8.2* 7.8*  PHOS 5.0*  --  5.1* 4.6  --  4.3 4.4 4.9*    Recent Labs Lab 04/15/14 0427 04/16/14 0358 04/17/14 0541  ALBUMIN 1.6* 1.7* 1.7*    Recent Labs Lab 04/13/14 0553 04/14/14 0915 04/15/14 0427  WBC 8.0 6.9 5.7  HGB 10.0* 10.6* 10.8*  HCT 29.2* 31.3* 32.4*  MCV 89.6 90.2 91.5   PLT 240 297 292    Recent Labs Lab 04/10/14 1552 04/10/14 2002  TROPONINI 0.05* 0.15*   CBG:  Recent Labs Lab 04/16/14 1835 04/16/14 1919 04/16/14 2020 04/16/14 2112 04/17/14 0736  GLUCAP 64* 69* 68* 80 257*    Iron Studies:   Recent Labs Lab 04/15/14 0427  IRON 33*  TIBC 203*  TSat 17%  Medications:   . amLODipine  10 mg Oral Daily  . antiseptic oral rinse  7 mL Mouth Rinse BID  . atorvastatin  80 mg Oral q1800  . carvedilol  12.5 mg Oral BID WC  . clopidogrel  75 mg Oral Daily  . enoxaparin (LOVENOX) injection  30 mg Subcutaneous Q24H  . furosemide  120 mg Oral BID  . insulin aspart  0-5 Units Subcutaneous QHS  . insulin aspart  0-9 Units Subcutaneous TID WC  . insulin aspart  3 Units Subcutaneous TID WC  . insulin glargine  5 Units Subcutaneous QHS  . polyethylene glycol  17 g  Oral Daily  . sodium chloride  3 mL Intravenous Q12H  . tamsulosin  0.4 mg Oral Daily    I  have reviewed scheduled and prn medications.  Background 69 y.o. year-old with hx of HTN, DM2 and CVA and CKD (creatinine 0.8 2008, 1.49 07/2013) presented with general weakness, inability to get up and swelling in face and extremities. Found to have creatinine of 2.4 and was admitted for renal failure and anasarca with low albumin. Was on diclofenac scheduled bid, ACEi , metformin - all held. Workup revealed nephrotic syndrome most likely on the basis of his DM.  Diuresing with lasix, with some rise in creatinine as expected.    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 Creatinine increased some with diuresis as expected - 2.9-3 may be new basline. On po lasix 120 BID with good uop. Edema markedly improved since  admission.  [facility] (Dr. Bridgett Larsson) has seen - no permanent access until at least a month out from his stroke.   Patient does wish to eventually go ahead with dialysis when the time comes. He will need followup with with me once discharged to become established CKD patient in our practice.    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 No intervention required at this time  Nephrotic syndrome - as above. Diabetic nephropathy.  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 Probable SNF for rehab. Moving left hand more today  Elevated troponin. EF 45-50% No CP or cardiac sx. Not good candidate for ACE with advanced CKD  DM - per primary  HTN - meds. Good control.  HLD statin  Anemia - no ESA indicated at this time  Aranesp if drops below 10. Tsat low at 16 - s/p Feraheme on 1/22  Hep C+ -  Viral load [id number].  Will need hepatology input as outpatient to address if needs treatment.   Jamal Maes, MD [facility] [phone] pager 04/17/2014, 11:13 AM

## 2014-04-17 NOTE — Progress Notes (Signed)
[facility] PROGRESS NOTE   [name]  male  UVO:[id number]    DOB: 1945/04/26    DOA: 04/09/2014  PCP: Matthew Fendt, MD  Interval History  [name] is a 69 yo male with PMH of HTN, type 2 DM, prior CVA (mild R hemiparesis deficits, not on antiplat), chronic kidney disease stage III, that presented to ED with worsening swelling of his lower extremities, abdominal distention, overall generalized weakness, and facial puffiness. He denied any sob, orthopnea, PND. Patient reported weakness and had a fall outside his house as he felt his legs were heavy and weak.  Also, he reports incontinence, has difficulty urinating, with urgency and frequency. In ED, creat 2.4, baseline 1.4 in May 2015, troponin +0.04, BNP 708.3, pt admitted for acute on chronic renal failure, and is followed by Nephrology. 04/12/14- pt with c/o's of left sided weakness and evidence of new CVA on MRI, neurology is following.  Assessment/Plan:  Acute renal failure on CK D stage III -Creatinine 2.9 today, baseline about  1.4 -US renal- no hydronephrosis/obstruction,chronic renal disease. -Nephrology on board- Continue oral Lasix 120mg  BID.  -Vein mapping 04/14/14; as per Dr. Bridgett Larsson, to follow up in about 1 month for fistula placement. - SPEP, UPEP, ANCA, ANA negative.  -Hep C Ab +, viral load [id number]; will need GI follow up as an OP to assess whether treatment is needed. -Continue foley, stric I& O- net neg 9.6 L since admission.  Acute Right Hemisphere CVA -MRI brain-acute infarct in right hemisphere affecting corona radiata and deep gray matter nuclei -Neurology on board- ASA  325, stroke workup -Hgb A1c 8.2, LDL 185, 2-D  -ECHO:Study Conclusions:  Left ventricle: The cavity size was normal. Wall thickness was increased in a pattern of severe LVH. Systolic function was mildly reduced. The estimated ejection fraction was in the range of 45% to 50%. Wall motion was normal; there were  no regional wall motion abnormalities. Doppler parameters are consistent with abnormal left ventricular relaxation (grade 1 diastolic dysfunction). -Carotid Dopplers: 1-39% ICA stenosis. Vertebral artery flow is antegrade.   -PT consult recommends CIR, but patient will be going to SNF given insurance issues. -MBS- Regular; thin liquid. -Tele-monitoring, frequent neuro checks -Neuro consult recommends a 30 day monitor which can be arranged at time of DC.  Elevated troponin:  -Troponin with mild elevation- likely secondary to renal disease -Cards on board-Not candidate for cath due to ARF.  -2D echo- EF 45-50%, severe LVH, w/o variations in wall motion -continue ASA, stain, carvedilol and amlodipine.  Diabetes mellitus uncontrolled with renal complications - Hgb V4Q 8.2 -Fair control. -Continue to monitor and adjust treatment as necessary.  Hypertension -fair control. -Continue Norvasc, carvedilol, PRN IV hydralazine with parameters.   -Will allow permissive HTN in light of recent CVA.  Hyperlipidemia/Hypercholesterolemia - Lipid panel with LDL 185, Choles 268 - Continue Zocor  BPH -Continue Flomax  DVT Prophylaxis: SQ Lovenox  Code Status: FULL  Family Communication:  No family at bedside  Disposition: To SNF.  Consultants:  Nephrology  Cardiology  Procedures:  Renal US  2D echo  Antibiotics:  none   Subjective: More interactive today than on prior days.  Objective: Weight change: 0.57 kg (1 lb 4.1 oz)  Intake/Output Summary (Last 24 hours) at 04/17/14 1059 Last data filed at 04/17/14 1056  Gross per 24 hour  Intake   1080 ml  Output   1950 ml  Net   -870 ml   Blood pressure 123/68, pulse 70,  temperature 98.4 F (36.9 C), temperature source Oral, resp. rate 17, height 5\' 5"  (1.651 m), weight 68.7 kg (151 lb 7.3 oz), SpO2 94 %.  Physical Exam: General: Alert and awake, oriented x3 male in NAD  CVS: S1-S2 clear, no murmur rubs or  gallops Chest: clear to auscultation bilaterally, no wheezing, rales or rhonchi Abdomen: soft nontender, nondistended, normal bowel sounds  Extremities: No cyanosis, clubbing, 2+edema bilaterally  Lab Results: Basic Metabolic Panel:  Recent Labs Lab 04/16/14 0358 04/17/14 0541  NA 134* 129*  K 4.0 4.0  CL 96 91*  CO2 28 29  GLUCOSE 117* 237*  BUN 53* 59*  CREATININE 2.76* 2.95*  CALCIUM 8.2* 7.8*  PHOS 4.4 4.9*   Liver Function Tests:  Recent Labs Lab 04/16/14 0358 04/17/14 0541  ALBUMIN 1.7* 1.7*   No results for input(s): LIPASE, AMYLASE in the last 168 hours. No results for input(s): AMMONIA in the last 168 hours. CBC:  Recent Labs Lab 04/14/14 0915 04/15/14 0427  WBC 6.9 5.7  HGB 10.6* 10.8*  HCT 31.3* 32.4*  MCV 90.2 91.5  PLT 297 292   Cardiac Enzymes:  Recent Labs Lab 04/10/14 1552 04/10/14 2002  TROPONINI 0.05* 0.15*   BNP: Invalid input(s): POCBNP CBG:  Recent Labs Lab 04/16/14 1835 04/16/14 1919 04/16/14 2020 04/16/14 2112 04/17/14 0736  GLUCAP 64* 69* 68* 80 257*     Micro Results: Recent Results (from the past 240 hour(s))  Urine culture     Status: None   Collection Time: 04/10/14  6:15 AM  Result Value Ref Range Status   Specimen Description URINE, CLEAN CATCH  Final   Special Requests NONE  Final   Colony Count   Final    70,000 COLONIES/ML Performed at [facility]    Culture   Final    Multiple bacterial morphotypes present, none predominant. Suggest appropriate recollection if clinically indicated. Performed at [facility]    Report Status 04/11/2014 FINAL  Final    Studies/Results: Dg Chest 2 View  04/09/2014   CLINICAL DATA:  Weakness rete 3 days, leg swelling for few months, dry cough for 2 weeks, hypertension, diabetes  EXAM: CHEST  2 VIEW  COMPARISON:  08/18/2013  FINDINGS: Enlargement of cardiac silhouette.  Mediastinal contours and pulmonary vascularity normal.  Lungs clear.  Tiny LEFT  pleural effusion.  No pneumothorax.  Bones unremarkable.  IMPRESSION: Enlargement of cardiac silhouette.  Tiny LEFT pleural effusion.   Electronically Signed   By: Lavonia Dana M.D.   On: 04/09/2014 17:25   Mr Jodene Nam Head Wo Contrast  04/13/2014   CLINICAL DATA:  Acute nonhemorrhagic right PCA territory infarcts. Bilateral MCA stenosis.  EXAM: MRA HEAD WITHOUT CONTRAST  TECHNIQUE: Angiographic images of the Circle of Willis were obtained using MRA technique without intravenous contrast.  COMPARISON:  MRI brain 04/12/2014.  Cerebral arteriogram 02/04/2007  FINDINGS: Tortuosity of the cervical internal carotid arteries is similar to the prior exam. There is mild narrowing within the anterior genu of the cavernous left internal carotid artery. No significant ICA stenosis is present otherwise.  Signal loss within the proximal left anterior cerebral artery suggests the moderate stenosis of up to 70%. More mild distal left M1 stenosis is similar to the prior study, approximately 50% relative to the more distal vessel. There is severe stenosis of a proximal inferior right M2 branch. The MCA bifurcations are otherwise intact. Mild to moderate distal small vessel attenuation is present bilaterally, worse on the right. The anterior  communicating artery is patent. There is severe stenosis and proximal occlusion of the left A2 segment. Moderate irregularity is present in the more distal right a CA vessel.  The left vertebral artery is dominant vessel. The PICA origins are visualized and normal bilaterally. The basilar artery is within normal limits. There is mild narrowing of the proximal P1 segments bilaterally. New the moderate superior stenosis is present within the right P3 segment, corresponding to the areas of infarction. Less prominent attenuation is present within the left PCA branch vessels.  IMPRESSION: 1. Moderate to severe focal stenosis within the right P3 segment corresponds with the area of acute infarction. 2.  Moderate diffuse small vessel disease, most prominent in the right PCA branch vessels, right MCA branch vessels, and bilateral ACA vessels. 3. High-grade stenosis of the proximal left ACA and slightly more distal occlusion. 4. Mild M1 narrowing on the left is stable. 5. Mild to moderate proximal left A1 stenosis.   Electronically Signed   By: Lawrence Santiago M.D.   On: 04/13/2014 14:22   Mr Brain Wo Contrast  04/12/2014   CLINICAL DATA:  69 year old male with left side weakness. Uncontrolled hypertension and diabetes. Worsening extremity swelling. Initial encounter.  EXAM: MRI HEAD WITHOUT CONTRAST  TECHNIQUE: Multiplanar, multiecho pulse sequences of the brain and surrounding structures were obtained without intravenous contrast.  COMPARISON:  Brain MRI 02/02/2007.  Cerebral angiogram 02/04/2007.  FINDINGS: Confluent restricted diffusion tracking from the right corona radiata into the deep gray matter nuclei as well as the right temporal stem. The mesial right temporal lobe is spared. There is some involvement of the right thalamus.  Associated T2 and FLAIR hyperintensity. No associated hemorrhage or significant mass effect.  Major intracranial vascular flow voids are stable.  No contralateral or posterior fossa restricted diffusion. Cerebral volume has decreased since 2008. Mild ex vacuo ventricle enlargement has occurred. Mildly progressed and widespread Patchy and confluent cerebral white matter T2 and FLAIR hyperintensity. Occasional small areas of cortical encephalomalacia in both MCA territories. Progressed chronic T2 abnormality in the deep gray matter nuclei, especially the left thalamus. Increased T2 abnormality in the central on left pons. Cerebellum remains within normal limits.  No midline shift, mass effect, or evidence of intracranial mass lesion. No acute intracranial hemorrhage identified. Negative pituitary and cervicomedullary junction. Partially visible cervical spine degeneration. Bone marrow  signal within normal limits. Visible internal auditory structures appear normal. Mastoids are clear. Mild paranasal sinus mucosal thickening is similar to the prior exam. Visualized orbit soft tissues are within normal limits. Visualized scalp soft tissues are within normal limits.  IMPRESSION: 1. Patchy acute infarct in the right hemisphere affecting the corona radiata and deep gray matter nuclei. No associated hemorrhage or mass effect. 2. 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   Electronically Signed   By: Lars Pinks M.D.   On: 04/12/2014 09:26   US Renal  04/10/2014   CLINICAL DATA:  Acute renal failure.  Initial encounter.  EXAM: RENAL/URINARY TRACT ULTRASOUND COMPLETE  COMPARISON:  None.  FINDINGS: Right Kidney:  Length: 10.3 cm. Increased renal parenchymal echogenicity is noted. Mild right-sided perinephric fluid is noted. No mass or hydronephrosis visualized.  Left Kidney:  Length: 11.9 cm. Mildly increased renal parenchymal echogenicity is noted. Mild left-sided perinephric fluid is seen. No mass or hydronephrosis visualized.  Bladder:  Minimal apparent echoes near the anterior bladder wall may be artifactual in nature. The bladder is otherwise unremarkable. Bilateral ureteral jets are visualized.  IMPRESSION:  1. Increased renal cortical echogenicity raises concern for medical renal disease. 2. No evidence of hydronephrosis.  Bilateral ureteral jets are seen. 3. Mild bilateral perinephric fluid noted, nonspecific in nature.   Electronically Signed   By: Garald Balding M.D.   On: 04/10/2014 04:31    Medications: Scheduled Meds: . amLODipine  10 mg Oral Daily  . antiseptic oral rinse  7 mL Mouth Rinse BID  . atorvastatin  80 mg Oral q1800  . carvedilol  12.5 mg Oral BID WC  . clopidogrel  75 mg Oral Daily  . enoxaparin (LOVENOX) injection  30 mg Subcutaneous Q24H  . furosemide  120 mg Oral BID  . insulin aspart  0-5 Units Subcutaneous QHS  . insulin aspart   0-9 Units Subcutaneous TID WC  . insulin aspart  3 Units Subcutaneous TID WC  . insulin glargine  5 Units Subcutaneous QHS  . polyethylene glycol  17 g Oral Daily  . sodium chloride  3 mL Intravenous Q12H  . tamsulosin  0.4 mg Oral Daily   Time Spent: 25 minutes   LOS: 8 days    HERNANDEZ ACOSTA,ESTELA  [facility] 04/17/2014, 10:59 AM Pager: [phone]  If 7PM-7AM, please contact night-coverage [email or web address] Password [email or web address]

## 2014-04-17 NOTE — Progress Notes (Signed)
CBG=  59. Sprite given to patient. Patient awake. Will recheck in 15-20 minutes.

## 2014-04-18 LAB — GLUCOSE, CAPILLARY
GLUCOSE-CAPILLARY: 158 mg/dL — AB (ref 70–99)
Glucose-Capillary: 156 mg/dL — ABNORMAL HIGH (ref 70–99)
Glucose-Capillary: 149 mg/dL — ABNORMAL HIGH (ref 70–99)

## 2014-04-18 LAB — RENAL FUNCTION PANEL
ANION GAP: 11 (ref 5–15)
Albumin: 1.6 g/dL — ABNORMAL LOW (ref 3.5–5.2)
BUN: 61 mg/dL — ABNORMAL HIGH (ref 6–23)
CALCIUM: 7.9 mg/dL — AB (ref 8.4–10.5)
CO2: 28 mmol/L (ref 19–32)
Chloride: 92 mmol/L — ABNORMAL LOW (ref 96–112)
Creatinine, Ser: 3.05 mg/dL — ABNORMAL HIGH (ref 0.50–1.35)
GFR calc Af Amer: 23 mL/min — ABNORMAL LOW (ref >90)
GFR calc non Af Amer: 20 mL/min — ABNORMAL LOW (ref >90)
Glucose, Bld: 171 mg/dL — ABNORMAL HIGH (ref 70–99)
Phosphorus: 4.7 mg/dL — ABNORMAL HIGH (ref 2.3–4.6)
Potassium: 4 mmol/L (ref 3.5–5.1)
Sodium: 131 mmol/L — ABNORMAL LOW (ref 135–145)

## 2014-04-18 LAB — MICROALBUMIN / CREATININE URINE RATIO
CREATININE, URINE: 61.6 mg/dL
MICROALB UR: 167 mg/dL — AB (ref 0.00–1.89)
Microalb Creat Ratio: 2711 mg/g — ABNORMAL HIGH (ref 0.0–30.0)

## 2014-04-18 MED ORDER — HYDROCODONE-ACETAMINOPHEN 5-325 MG PO TABS: 1.0000 | ORAL_TABLET | ORAL | Status: DC | PRN

## 2014-04-18 MED ORDER — POLYETHYLENE GLYCOL 3350 17 G PO PACK: 17.0000 g | PACK | Freq: Every day | ORAL | Status: DC

## 2014-04-18 MED ORDER — ATORVASTATIN CALCIUM 80 MG PO TABS: 80.0000 mg | ORAL_TABLET | Freq: Every day | ORAL | Status: DC

## 2014-04-18 MED ORDER — CARVEDILOL 12.5 MG PO TABS: 12.5000 mg | ORAL_TABLET | Freq: Two times a day (BID) | ORAL | Status: DC

## 2014-04-18 MED ORDER — AMLODIPINE BESYLATE 10 MG PO TABS: 10.0000 mg | ORAL_TABLET | Freq: Every day | ORAL | Status: DC

## 2014-04-18 MED ORDER — INSULIN GLARGINE 100 UNIT/ML ~~LOC~~ SOLN: 5.0000 [IU] | Freq: Every day | SUBCUTANEOUS | Status: DC

## 2014-04-18 MED ORDER — CLOPIDOGREL BISULFATE 75 MG PO TABS: 75.0000 mg | ORAL_TABLET | Freq: Every day | ORAL | Status: AC

## 2014-04-18 MED ORDER — FUROSEMIDE 40 MG PO TABS: 120.0000 mg | ORAL_TABLET | Freq: Two times a day (BID) | ORAL | Status: DC

## 2014-04-18 NOTE — Consult Note (Signed)
Met with the inpatient RNCM and CSW regarding the patient is an active member of [employer].  Patient is discharging to a skilled facility for short-term rehabilitation.  [facility] called for support in the transition to his return home. Patient had just finished working with therapy and sitting up in the recliner, resting per visitors. Visitors at the bedside states that his wife will be back later on.  Will continue to follow up. Patient is scheduled for discharge to a skilled facility today.  Of note, [facility] Care management does not replace or interfere with any services arranged for the patient's disposition.  For questions, please call Natividad Brood, RN, BSN, [facility] Liaison at [phone].

## 2014-04-18 NOTE — Clinical Social Work Placement (Signed)
Clinical Social Work Department CLINICAL SOCIAL WORK PLACEMENT NOTE 04/18/2014  Patient:  [name], [name]  Account Number:  [id number] [place] date:  04/09/2014  Clinical Social Worker:  Davinder Haff Givens, LCSW  Date/time:  04/18/2014 06:14 AM  Clinical Social Work is seeking post-discharge placement for this patient at the following level of care:   [place]   (*CSW will update this form in Epic as items are completed)   04/13/2014  Patient/family provided with [place] Department of Clinical Social Work's list of facilities offering this level of care within the geographic area requested by the patient (or if unable, by the patient's family).  04/13/2014  Patient/family informed of their freedom to choose among providers that offer the needed level of care, that participate in Medicare, Medicaid or managed care program needed by the patient, have an available bed and are willing to accept the patient.    Patient/family informed of [facility]' ownership interest in [facility], as well as of the fact that they are under no obligation to receive care at this facility.  PASARR submitted to EDS on 04/14/2014 PASARR number received on   FL2 transmitted to all facilities in geographic area requested by pt/family on  04/14/2014 FL2 transmitted to all facilities within larger geographic area on   Patient informed that his/her managed care company has contracts with or will negotiate with  certain facilities, including the following:     Patient/family informed of bed offers received:  04/18/2014 Patient chooses bed at [place] Physician recommends and patient chooses bed at    Patient to be transferred to [facility] on  04/18/2014 Patient to be transferred to facility by ambulance Patient and family notified of transfer on 04/18/2014 Name of family member notified:  Lavell Luster- wife, at the bedside  The following physician request were entered in  Epic:  Additional Comments:   Jamere Stidham Givens, MSW, [place] Licensed Clinical Social Worker Clinical Social Work [place] [phone]

## 2014-04-18 NOTE — Discharge Summary (Signed)
Physician Discharge Summary  [name] DJM:[id number] DOB: 1945-05-13 DOA: 04/09/2014  PCP: Matthew Fendt, MD  Admit date: 04/09/2014 Discharge date: 04/18/2014  Time spent: 45 minutes  Recommendations for Outpatient Follow-up:  -Will be discharged to SNF today. -Will follow up as an OP with [facility] for continued care of her Stage III CKD.   Discharge Diagnoses:  Principal Problem:   Acute renal failure Active Problems:   Hypertension   Hypercholesterolemia   Diabetes mellitus without complication   Elevated troponin   Anasarca   Congestive dilated cardiomyopathy   Cerebrovascular disease   Acute CVA (cerebrovascular accident)   Essential hypertension   Type 2 diabetes mellitus with other circulatory complications   Discharge Condition: Stable and improved  Filed Weights   04/15/14 2132 04/16/14 2108 04/17/14 2200  Weight: 68.13 kg (150 lb 3.2 oz) 68.7 kg (151 lb 7.3 oz) 69.2 kg (152 lb 8.9 oz)    History of present illness:  Patient is a 69 year old male with uncontrolled hypertension, diabetes mellitus, prior history of CVA 2008, chronic kidney disease, has not seen any nephrologist presented to ED with worsening swelling of his lower extremities, abdominal distention, overall generalized weakness. History was obtained from the patient who reported that his lower extremity swelling has been worsening in the last 1 year, was placed on Lasix 40 mg daily by his PCP 6 months ago. Denies any orthopnea or PND. Per his wife, he has abdominal distention, has been gaining weight (patient was not able to quantify), facial puffiness. Patient reports that he feels weak and had a fall outside his house as he felt his legs were heavy and weak. Patient also reports incontinence, has difficulty urinating, with urgency and frequency. On Flomax In ED, patient was noted to have creatinine of 2.4, baseline 1.4 in May 2015, troponin +0.04, BNP 708.3  Hospital Course:    Acute renal failure on CKD stage III -Creatinine 3.05 today, baseline about 1.4 -US renal- no hydronephrosis/obstruction,chronic renal disease. -Nephrology on board- Continue oral Lasix 120mg  BID.  -Vein mapping 04/14/14; as per Dr. Bridgett [name], to follow up in about 1 month for fistula placement. - SPEP, UPEP, ANCA, ANA negative.  -Hep C Ab +, viral load [id number]; will need GI follow up as an OP to assess whether treatment is needed. -Excellent diuresis is 10.9 L negative since admission.  Acute Right Hemisphere CVA -MRI brain-acute infarct in right hemisphere affecting corona radiata and deep gray matter nuclei - ASA 325  -Hgb A1c 8.2, LDL 185, 2-D  -ECHO:Study Conclusions: Left ventricle: The cavity size was normal. Wall thickness was increased in a pattern of severe LVH. Systolic function was mildly reduced. The estimated ejection fraction was in the range of 45% to 50%. Wall motion was normal; there were no regional wall motion abnormalities. Doppler parameters are consistent with abnormal left ventricular relaxation (grade 1 diastolic dysfunction). -Carotid Dopplers: 1-39% ICA stenosis. Vertebral artery flow is antegrade.   -PT consult recommends CIR, but patient will be going to SNF given insurance issues. -MBS- Regular; thin liquid. -Tele-monitoring, frequent neuro checks -Neuro consult recommends a 30 day monitor. Will arrange with [facility] cards.  Elevated troponin -Troponin with mild elevation- likely secondary to renal disease/acute CVA -Cards on board-Not candidate for cath due to ARF.  -2D echo- EF 45-50%, severe LVH, w/o variations in wall motion -continue ASA, stain, carvedilol and amlodipine.  Diabetes mellitus uncontrolled with renal complications - Hgb Q2W 8.2 -Fair control. -Continue to monitor and adjust treatment as  necessary.  Hypertension -fair control. -Continue Norvasc, carvedilol. -Will allow permissive HTN in light of recent  CVA.  Hyperlipidemia/Hypercholesterolemia - Lipid panel with LDL 185, Choles 268 - Continue Zocor  BPH -Continue Flomax  Procedures:  None   Consultations:  Renal  Neurology  Discharge Instructions  Discharge Instructions    Ambulatory referral to Neurology    Complete by:  As directed   Pt will follow up with [facility]. [facility]linda [name] at [facility] in about 2 months. Thanks.     Diet - low sodium heart healthy    Complete by:  As directed      Increase activity slowly    Complete by:  As directed             Medication List    STOP taking these medications        diclofenac 25 MG EC tablet  Commonly known as:  VOLTAREN     diclofenac sodium 1 % Gel  Commonly known as:  VOLTAREN     metFORMIN 1000 MG tablet  Commonly known as:  GLUCOPHAGE     quinapril 40 MG tablet  Commonly known as:  ACCUPRIL     simvastatin 20 MG tablet  Commonly known as:  ZOCOR      TAKE these medications        amLODipine 10 MG tablet  Commonly known as:  NORVASC  Take 1 tablet (10 mg total) by mouth daily.     atorvastatin 80 MG tablet  Commonly known as:  LIPITOR  Take 1 tablet (80 mg total) by mouth daily at 6 PM.     carvedilol 12.5 MG tablet  Commonly known as:  COREG  Take 1 tablet (12.5 mg total) by mouth 2 (two) times daily with a meal.     clopidogrel 75 MG tablet  Commonly known as:  PLAVIX  Take 1 tablet (75 mg total) by mouth daily.     furosemide 40 MG tablet  Commonly known as:  LASIX  Take 3 tablets (120 mg total) by mouth 2 (two) times daily.     HYDROcodone-acetaminophen 5-325 MG per tablet  Commonly known as:  NORCO/VICODIN  Take 1 tablet by mouth every 4 (four) hours as needed for moderate pain.     insulin glargine 100 UNIT/ML injection  Commonly known as:  LANTUS  Inject 0.05 mLs (5 Units total) into the skin at bedtime.     polyethylene glycol packet  Commonly known as:  MIRALAX / GLYCOLAX  Take 17 g by mouth daily.     tamsulosin 0.4 MG Caps capsule  Commonly  known as:  FLOMAX  Take 0.4 mg by mouth.       No Known Allergies    The results of significant diagnostics from this hospitalization (including imaging, microbiology, ancillary and laboratory) are listed below for reference.    Significant Diagnostic Studies: Dg Chest 2 View  04/09/2014   CLINICAL DATA:  Weakness rete 3 days, leg swelling for few months, dry cough for 2 weeks, hypertension, diabetes  EXAM: CHEST  2 VIEW  COMPARISON:  08/18/2013  FINDINGS: Enlargement of cardiac silhouette.  Mediastinal contours and pulmonary vascularity normal.  Lungs clear.  Tiny LEFT pleural effusion.  No pneumothorax.  Bones unremarkable.  IMPRESSION: Enlargement of cardiac silhouette.  Tiny LEFT pleural effusion.   Electronically Signed   By: Lavonia Dana M.D.   On: 04/09/2014 17:25   Mr Jodene Nam Head Wo Contrast  04/13/2014   CLINICAL DATA:  Acute  nonhemorrhagic right PCA territory infarcts. Bilateral MCA stenosis.  EXAM: MRA HEAD WITHOUT CONTRAST  TECHNIQUE: Angiographic images of the Circle of Willis were obtained using MRA technique without intravenous contrast.  COMPARISON:  MRI brain 04/12/2014.  Cerebral arteriogram 02/04/2007  FINDINGS: Tortuosity of the cervical internal carotid arteries is similar to the prior exam. There is mild narrowing within the anterior genu of the cavernous left internal carotid artery. No significant ICA stenosis is present otherwise.  Signal loss within the proximal left anterior cerebral artery suggests the moderate stenosis of up to 70%. More mild distal left M1 stenosis is similar to the prior study, approximately 50% relative to the more distal vessel. There is severe stenosis of a proximal inferior right M2 branch. The MCA bifurcations are otherwise intact. Mild to moderate distal small vessel attenuation is present bilaterally, worse on the right. The anterior communicating artery is patent. There is severe stenosis and proximal occlusion of the left A2 segment. Moderate  irregularity is present in the more distal right a CA vessel.  The left vertebral artery is dominant vessel. The PICA origins are visualized and normal bilaterally. The basilar artery is within normal limits. There is mild narrowing of the proximal P1 segments bilaterally. New the moderate superior stenosis is present within the right P3 segment, corresponding to the areas of infarction. Less prominent attenuation is present within the left PCA branch vessels.  IMPRESSION: 1. Moderate to severe focal stenosis within the right P3 segment corresponds with the area of acute infarction. 2. Moderate diffuse small vessel disease, most prominent in the right PCA branch vessels, right MCA branch vessels, and bilateral ACA vessels. 3. High-grade stenosis of the proximal left ACA and slightly more distal occlusion. 4. Mild M1 narrowing on the left is stable. 5. Mild to moderate proximal left A1 stenosis.   Electronically Signed   By: Lawrence Santiago M.D.   On: 04/13/2014 14:22   Mr Brain Wo Contrast  04/12/2014   CLINICAL DATA:  69 year old male with left side weakness. Uncontrolled hypertension and diabetes. Worsening extremity swelling. Initial encounter.  EXAM: MRI HEAD WITHOUT CONTRAST  TECHNIQUE: Multiplanar, multiecho pulse sequences of the brain and surrounding structures were obtained without intravenous contrast.  COMPARISON:  Brain MRI 02/02/2007.  Cerebral angiogram 02/04/2007.  FINDINGS: Confluent restricted diffusion tracking from the right corona radiata into the deep gray matter nuclei as well as the right temporal stem. The mesial right temporal lobe is spared. There is some involvement of the right thalamus.  Associated T2 and FLAIR hyperintensity. No associated hemorrhage or significant mass effect.  Major intracranial vascular flow voids are stable.  No contralateral or posterior fossa restricted diffusion. Cerebral volume has decreased since 2008. Mild ex vacuo ventricle enlargement has occurred. Mildly  progressed and widespread Patchy and confluent cerebral white matter T2 and FLAIR hyperintensity. Occasional small areas of cortical encephalomalacia in both MCA territories. Progressed chronic T2 abnormality in the deep gray matter nuclei, especially the left thalamus. Increased T2 abnormality in the central on left pons. Cerebellum remains within normal limits.  No midline shift, mass effect, or evidence of intracranial mass lesion. No acute intracranial hemorrhage identified. Negative pituitary and cervicomedullary junction. Partially visible cervical spine degeneration. Bone marrow signal within normal limits. Visible internal auditory structures appear normal. Mastoids are clear. Mild paranasal sinus mucosal thickening is similar to the prior exam. Visualized orbit soft tissues are within normal limits. Visualized scalp soft tissues are within normal limits.  IMPRESSION: 1. Patchy acute infarct in the  right hemisphere affecting the corona radiata and deep gray matter nuclei. No associated hemorrhage or mass effect. 2. 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   Electronically Signed   By: Lars Pinks M.D.   On: 04/12/2014 09:26   US Renal  04/10/2014   CLINICAL DATA:  Acute renal failure.  Initial encounter.  EXAM: RENAL/URINARY TRACT ULTRASOUND COMPLETE  COMPARISON:  None.  FINDINGS: Right Kidney:  Length: 10.3 cm. Increased renal parenchymal echogenicity is noted. Mild right-sided perinephric fluid is noted. No mass or hydronephrosis visualized.  Left Kidney:  Length: 11.9 cm. Mildly increased renal parenchymal echogenicity is noted. Mild left-sided perinephric fluid is seen. No mass or hydronephrosis visualized.  Bladder:  Minimal apparent echoes near the anterior bladder wall may be artifactual in nature. The bladder is otherwise unremarkable. Bilateral ureteral jets are visualized.  IMPRESSION: 1. Increased renal cortical echogenicity raises concern for medical renal  disease. 2. No evidence of hydronephrosis.  Bilateral ureteral jets are seen. 3. Mild bilateral perinephric fluid noted, nonspecific in nature.   Electronically Signed   By: Garald Balding M.D.   On: 04/10/2014 04:31    Microbiology: Recent Results (from the past 240 hour(s))  Urine culture     Status: None   Collection Time: 04/10/14  6:15 AM  Result Value Ref Range Status   Specimen Description URINE, CLEAN CATCH  Final   Special Requests NONE  Final   Colony Count   Final    70,000 COLONIES/ML Performed at [facility]    Culture   Final    Multiple bacterial morphotypes present, none predominant. Suggest appropriate recollection if clinically indicated. Performed at [facility]    Report Status 04/11/2014 FINAL  Final     Labs: Basic Metabolic Panel:  Recent Labs Lab 04/13/14 0553 04/14/14 0915 04/15/14 0427 04/16/14 0358 04/17/14 0541 04/18/14 0516  NA 139 139 139 134* 129* 131*  K 4.2 4.0 3.9 4.0 4.0 4.0  CL 104 103 99 96 91* 92*  CO2 24 26 30 28 29 28   GLUCOSE 226* 87 147* 117* 237* 171*  BUN 52* 55* 55* 53* 59* 61*  CREATININE 3.06* 3.09* 3.00* 2.76* 2.95* 3.05*  CALCIUM 7.9* 8.2* 8.1* 8.2* 7.8* 7.9*  PHOS 4.6  --  4.3 4.4 4.9* 4.7*   Liver Function Tests:  Recent Labs Lab 04/13/14 0553 04/15/14 0427 04/16/14 0358 04/17/14 0541 04/18/14 0516  ALBUMIN 1.5* 1.6* 1.7* 1.7* 1.6*   No results for input(s): LIPASE, AMYLASE in the last 168 hours. No results for input(s): AMMONIA in the last 168 hours. CBC:  Recent Labs Lab 04/13/14 0553 04/14/14 0915 04/15/14 0427  WBC 8.0 6.9 5.7  HGB 10.0* 10.6* 10.8*  HCT 29.2* 31.3* 32.4*  MCV 89.6 90.2 91.5  PLT 240 297 292   Cardiac Enzymes: No results for input(s): CKTOTAL, CKMB, CKMBINDEX, TROPONINI in the last 168 hours. BNP: BNP (last 3 results) No results for input(s): PROBNP in the last 8760 hours. CBG:  Recent Labs Lab 04/17/14 1705 04/17/14 1759 04/17/14 2205 04/18/14 0736  04/18/14 1247  GLUCAP 58* 94 158* 156* 158*       Signed:  HERNANDEZ ACOSTA,ESTELA  [facility] Pager: [phone] 04/18/2014, 2:22 PM

## 2014-04-18 NOTE — Progress Notes (Signed)
Subjective: Interval History: has no complaint.  Objective: Vital signs in last 24 hours: Temp:  [97.9 F (36.6 C)-98.7 F (37.1 C)] 98.7 F (37.1 C) (01/25 0735) Pulse Rate:  [70-81] 70 (01/25 0735) Resp:  [16-18] 18 (01/25 0735) BP: (123-142)/(65-106) 132/68 mmHg (01/25 0735) SpO2:  [94 %-98 %] 98 % (01/25 0735) Weight:  [69.2 kg (152 lb 8.9 oz)] 69.2 kg (152 lb 8.9 oz) (01/24 2200) Weight change: 0.5 kg (1 lb 1.6 oz)  Intake/Output from previous day: 01/24 0701 - 01/25 0700 In: 800 [P.O.:800] Out: 1976 [Urine:1975; Stool:1] Intake/Output this shift:    General appearance: slowed mentation and lethargic, cooperative but not interactive Resp: diminished breath sounds bilaterally Cardio: S1, S2 normal and systolic murmur: holosystolic 2/6, blowing at apex GI: soft, non-tender; bowel sounds normal; no masses,  no organomegaly Extremities: edema 2+  Lab Results: No results for input(s): WBC, HGB, HCT, PLT in the last 72 hours. BMET:  Recent Labs  04/17/14 0541 04/18/14 0516  NA 129* 131*  K 4.0 4.0  CL 91* 92*  CO2 29 28  GLUCOSE 237* 171*  BUN 59* 61*  CREATININE 2.95* 3.05*  CALCIUM 7.8* 7.9*   No results for input(s): PTH in the last 72 hours. Iron Studies: No results for input(s): IRON, TIBC, TRANSFERRIN, FERRITIN in the last 72 hours.  Studies/Results: No results found.  I have reviewed the patient's current medications.  Assessment/Plan: 1  CKD4 diuresing at desireable rate.  Acid/base/K ok.  No need to change at this time. Still vol xs, slowly resolve 2 Anemia f/u outpatient 3 CVA 4 DM variable controle 5 HTN can lower med with diuresis P lower antiHTN, f/u outpatient.  Will s/o for now.    LOS: 9 days   Kodey Xue L 04/18/2014,9:32 AM

## 2014-04-18 NOTE — Progress Notes (Signed)
Inpatient Diabetes Program Recommendations  AACE/ADA: New Consensus Statement on Inpatient Glycemic Control (2013)  Target Ranges:  Prepandial:   less than 140 mg/dL      Peak postprandial:   less than 180 mg/dL (1-2 hours)      Critically ill patients:  140 - 180 mg/dL   Results for VYOM, BRASS (MRN [id number]) as of 04/18/2014 11:10  Ref. Range 04/17/2014 07:36 04/17/2014 11:13 04/17/2014 17:05 04/17/2014 17:59 04/17/2014 22:05 04/18/2014 07:36  Glucose-Capillary Latest Range: 70-99 mg/dL 257 (H) 181 (H) 58 (L) 94 158 (H) 156 (H)    Current orders for Inpatient glycemic control: Lantus 5 units QHS, Novolog 0-9 units TID with meals, Novolog 0-5 units QHS, Novolog 3 units TID with meals  Inpatient Diabetes Program Recommendations Insulin - Meal Coverage: Noted CBG of 58 mg/dl yesterday at 17:05. Question if patient ate lunch (no meal percentage documented but pt received meal coverage for lunch).  NURSING: Please follow meal coverage parameters (Do NOT give if CBG < 80 or if patient eats less than 50% of meal).     Thanks, Barnie Alderman, RN, MSN, CCRN, CDE Diabetes Coordinator Inpatient Diabetes Program [phone] (Team Pager) [phone] ([place] office) [phone] [facility] office)

## 2014-04-18 NOTE — Progress Notes (Signed)
Physical Therapy Treatment Patient Details Name: [name] MRN: [id number] DOB: [birth year] Today's Date: 04/18/2014    History of Present Illness Adm 04/09/14 with h/o gradual progressive weakness and incr edema; +renal failure. 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 Was able to tolerate trunk activity at EOB. No initiation of movement noted to right trunk when leaning or bear weight through UE's during weight shifting activity. Friends present in room to sit with patient after session.   Follow Up Recommendations  SNF;Supervision/Assistance - 24 hour     Equipment Recommendations  Wheelchair (measurements PT);Wheelchair cushion (measurements PT);3in1 (PT);Other (comment)    Recommendations for Other Services       Precautions / Restrictions Precautions Precautions: Fall Precaution Comments: Lt shoulder at risk for subluxation Required Braces or Orthoses: Other Brace/Splint Other Brace/Splint: wears Rt AFO (post L CVA '08) Restrictions Weight Bearing Restrictions: No    Mobility  Bed Mobility Overal bed mobility: Needs Assistance Bed Mobility: Supine to Sit     Supine to sit: Total assist;+2 for physical assistance;HOB elevated     General bed mobility comments: Pt was able to transition to EOB while utilizing helicopter technique. No extensor tone noted this session, and pt was able to sit with feet resting on the floor. Sat ~10 minutes EOB.   Transfers Overall transfer level: Needs assistance Equipment used: 2 person hand held assist Transfers: Lateral/Scoot Transfers          Lateral/Scoot Transfers: Total assist;+2 physical assistance General transfer comment: Bed pad used for scooting assist bed to drop-arm recliner. Pt anteriorly resting head on therapists shoulders for support, and was  able to hold L hand supported within R hand. Otherwise did not really participate in scoot transfer.   Ambulation/Gait             General Gait Details: Did not attempt this session.    Stairs            Wheelchair Mobility    Modified Rankin (Stroke Patients Only) Modified Rankin (Stroke Patients Only) Pre-Morbid Rankin Score: No significant disability ([facility]) Modified Rankin: Severe disability     Balance Overall balance assessment: Needs assistance Sitting-balance support: Feet supported Sitting balance-[name]: Poor   Postural control: Posterior lean                          Cognition Arousal/Alertness: Awake/alert Behavior During Therapy: WFL for tasks assessed/performed Overall Cognitive Status: Impaired/Different from baseline Area of Impairment: Attention;Following commands;Awareness;Problem solving   Current Attention Level: Selective   Following Commands: Follows one step commands inconsistently;Follows one step commands with increased time   Awareness: Emergent Problem Solving: Slow processing;Decreased initiation;Difficulty sequencing;Requires verbal cues;Requires tactile cues      Exercises      General Comments General comments (skin integrity, edema, etc.): Pt was able to participate in weight shifting activity while sitting EOB. Was able to lean onto R and L elbows and prop himself up with some assist, however was not able to initiate righting trunk to midline. While sitting EOB did not note any active movement of LE's, R or L.      Pertinent Vitals/Pain Pain Assessment: Faces Faces Pain Scale: No hurt    Home Living  Prior Function            PT Goals (current goals can now be found in the care plan section) Acute Rehab PT Goals Patient Stated Goal: agrees he wants to get stronger PT Goal Formulation: With patient Time For Goal Achievement: 04/26/14 Potential  to Achieve Goals: Good Progress towards PT goals: Progressing toward goals    Frequency  Min 3X/week    PT Plan Current plan remains appropriate    Co-evaluation PT/OT/SLP Co-Evaluation/Treatment: Yes           End of Session Equipment Utilized During Treatment: Gait belt Activity Tolerance: Patient limited by fatigue;Patient limited by lethargy Patient left: in chair;with call bell/phone within reach;with nursing/sitter in room     Time: 1321-1347 PT Time Calculation (min) (ACUTE ONLY): 26 min  Charges:  $Therapeutic Activity: 23-37 mins                    G Codes:      Rolinda Roan 05-16-2014, 3:30 PM   Rolinda Roan, PT, DPT Acute Rehabilitation Services Pager: [phone]

## 2014-04-18 NOTE — Progress Notes (Signed)
Chaplain paged to visit with pt and family. Family had questions concerning AD, unclear about the nature of the document.   Issues resolved.  Delford Field, Chaplain 04/18/2014

## 2014-04-18 NOTE — Progress Notes (Signed)
Speech Language Pathology Treatment: Dysphagia  Patient Details Name: [name] MRN: [id number] DOB: 20-Oct-1945 Today's Date: 04/18/2014 Time: 8099-8338 SLP Time Calculation (min) (ACUTE ONLY): 26 min  Assessment / Plan / Recommendation Clinical Impression  Pt seen for dysphagia treatment during breakfast meal. Pt consumed regular textures and thin liquids via straw with no overt s/s of aspiration. Pt utilized a lingual sweep with Mod I to manage oral residuals. No further acute f/u for dysphagia warranted at this time, however recommend MD consider speech/language evaluation as warranted.   HPI HPI: 69 year old male with uncontrolled hypertension, diabetes mellitus, prior history of CVA 2008, chronic kidney disease, has not seen any nephrologist presented to ED with worsening swelling of his lower extremities, abdominal distention, overall generalized weakness. Dx acute renal failure; new symptoms left sided weakness 1/19 MRI confirming acute right CVA (corona radiata, deep gray matter nuclei). Now NPO - prior diet renal/ carbohydrate modified with 1200 ml fluids   Pertinent Vitals Pain Assessment: Faces Faces Pain Scale: No hurt  SLP Plan  Discharge SLP treatment due to (comment);All goals met    Recommendations Diet recommendations: Regular;Thin liquid Liquids provided via: Cup;Straw Medication Administration: Whole meds with puree (one at a time) Supervision: Patient able to self feed;Intermittent supervision to cue for compensatory strategies;Staff to assist with self feeding Compensations: Slow rate;Small sips/bites;Check for pocketing Postural Changes and/or Swallow Maneuvers: Seated upright 90 degrees              Oral Care Recommendations: Oral care BID Follow up Recommendations: Skilled Nursing facility Plan: Discharge SLP treatment due to (comment);All goals met    GO      [name], M.A. CCC-SLP [phone]  [name] 04/18/2014, 10:41  AM

## 2014-04-18 NOTE — Care Management Note (Signed)
CARE MANAGEMENT NOTE 04/18/2014  Patient:  TSUNEO, FAISON   Account Number:  [id number]  Date Initiated:  04/14/2014  Documentation initiated by:  Jasmine Pang  Subjective/Objective Assessment:   CM following for progression and d/c planning.     Action/Plan:   CM following for d/c needs, spoke with pt wife re needs, pt noncompliant per wife  04/18/2014 Pt for d/c to SNF today.   Anticipated DC Date:  04/18/2014   Anticipated DC Plan:  SKILLED NURSING FACILITY         Choice offered to / List presented to:             Status of service:  Completed, signed off Medicare Important Message given?  YES (If response is "NO", the following Medicare IM given date fields will be blank) Date Medicare IM given:  04/14/2014 Medicare IM given by:  Erminia Mcnew Date Additional Medicare IM given:  04/18/2014 Additional Medicare IM given by:  [facility]  Discharge Disposition:  [place]  Per UR Regulation:    If discussed at Long Length of Stay Meetings, dates discussed:    Comments:

## 2014-04-18 NOTE — Progress Notes (Signed)
Report called to Strong at [facility].  Roselyn Reef Kassadi Presswood,RN

## 2014-04-19 ENCOUNTER — Encounter: Payer: Self-pay | Admitting: Adult Health

## 2014-04-19 ENCOUNTER — Non-Acute Institutional Stay (SKILLED_NURSING_FACILITY): Payer: Commercial Managed Care - HMO | Admitting: Adult Health

## 2014-04-19 DIAGNOSIS — E119 Type 2 diabetes mellitus without complications: Secondary | ICD-10-CM | POA: Diagnosis not present

## 2014-04-19 DIAGNOSIS — E7800 Pure hypercholesterolemia, unspecified: Secondary | ICD-10-CM

## 2014-04-19 DIAGNOSIS — I639 Cerebral infarction, unspecified: Secondary | ICD-10-CM

## 2014-04-19 DIAGNOSIS — B1920 Unspecified viral hepatitis C without hepatic coma: Secondary | ICD-10-CM

## 2014-04-19 DIAGNOSIS — R601 Generalized edema: Secondary | ICD-10-CM | POA: Diagnosis not present

## 2014-04-19 DIAGNOSIS — N179 Acute kidney failure, unspecified: Secondary | ICD-10-CM | POA: Diagnosis not present

## 2014-04-19 DIAGNOSIS — I10 Essential (primary) hypertension: Secondary | ICD-10-CM | POA: Diagnosis not present

## 2014-04-19 DIAGNOSIS — K5900 Constipation, unspecified: Secondary | ICD-10-CM

## 2014-04-19 DIAGNOSIS — N400 Enlarged prostate without lower urinary tract symptoms: Secondary | ICD-10-CM | POA: Diagnosis not present

## 2014-04-19 NOTE — Progress Notes (Signed)
[name], male   DOB: [birth year], 69 y.o.   MRN: [id number]   04/19/2014  Facility:  Nursing Home Location:  [place] Room Number: 829-H LEVEL OF CARE:  SNF (31)   Chief Complaint  Patient presents with  . Hospitalization Follow-up    Acute renal failure, Acute CVA, hep C, diabetes mellitus, hypertension, hyperlipidemia, BPH, anasarca and constipation     HISTORY OF PRESENT ILLNESS:  This is a 69 year old male who was being admitted to [facility] on 04/18/14 from [facility]. He has past medical history of uncontrolled hypertension, diabetes mellitus, history of CVA in 2008 and chronic kidney disease. He presented to the ED with worsening swelling of his lower extremities, abdominal distention and generalized weakness.  He was given Lasix for his anasarca. He has creatinine of 3.06, baseline 1.4 - noted to have acute renal failure on CKD stage III. He was also noted to have acute right hemisphere CVA. MRI of brain shows acute infarct in the right hemisphere affecting corona radiata and deep gray matter nuclei. Patient has been admitted for a short-term rehabilitation.  PAST MEDICAL HISTORY:  Past Medical History  Diagnosis Date  . Hypertension   . Hypercholesterolemia   . Diabetes mellitus without complication   . Glaucoma   . Stroke     a. 01/2007 L corona radiata infarct.  . Cerebrovascular disease     a. 01/2007 MRI/A: L MCA 37-16%, R MCA 96%, LICA 78%.  . CKD (chronic kidney disease), stage III   . Chronic combined systolic and diastolic CHF, NYHA class 2     a. 01/2007 Echo: EF 60-65%;  b. 03/2014 Echo: EF 45-50%, Gr 1 DD, severe LVH.  Marland Kitchen Headache     CURRENT MEDICATIONS: Reviewed per MAR/see medication list  No Known Allergies   REVIEW OF SYSTEMS:  GENERAL: no change in appetite, no weight changes, no fever, chills  RESPIRATORY: no cough, SOB, DOE, wheezing, hemoptysis CARDIAC: no chest pain, or  palpitations GI: no abdominal pain, diarrhea, constipation, heart burn, nausea or vomiting  PHYSICAL EXAMINATION  GENERAL: no acute distress, normal body habitus EYES: conjunctivae normal, sclerae normal, normal eye lids NECK: supple, trachea midline, no neck masses, no thyroid tenderness, no thyromegaly LYMPHATICS: no LAN in the neck, no supraclavicular LAN RESPIRATORY: breathing is even & unlabored, BS CTAB CARDIAC: RRR, no murmur,no extra heart sounds, no edema GI: abdomen soft, normal BS, no masses, no tenderness, no hepatomegaly, no splenomegaly EXTREMITIES: able to move only the RUE; unable to move BLE and LUE NEURO:  Sleepy PSYCHIATRIC: the patient is alert & oriented to person, affect & behavior appropriate  LABS/RADIOLOGY: Labs reviewed: Basic Metabolic Panel:  Recent Labs  04/16/14 0358 04/17/14 0541 04/18/14 0516  NA 134* 129* 131*  K 4.0 4.0 4.0  CL 96 91* 92*  CO2 28 29 28   GLUCOSE 117* 237* 171*  BUN 53* 59* 61*  CREATININE 2.76* 2.95* 3.05*  CALCIUM 8.2* 7.8* 7.9*  PHOS 4.4 4.9* 4.7*   Liver Function Tests:  Recent Labs  04/09/14 1523  04/16/14 0358 04/17/14 0541 04/18/14 0516  AST 42*  --   --   --   --   ALT 31  --   --   --   --   ALKPHOS 102  --   --   --   --   BILITOT 0.5  --   --   --   --   PROT  5.5*  --   --   --   --   ALBUMIN 1.8*  < > 1.7* 1.7* 1.6*  < > = values in this interval not displayed.  CBC:  Recent Labs  04/09/14 1523  04/13/14 0553 04/14/14 0915 04/15/14 0427  WBC 9.7  < > 8.0 6.9 5.7  NEUTROABS 7.6  --   --   --   --   HGB 11.9*  < > 10.0* 10.6* 10.8*  HCT 35.0*  < > 29.2* 31.3* 32.4*  MCV 89.7  < > 89.6 90.2 91.5  PLT 261  < > 240 297 292  < > = values in this interval not displayed.  Lipid Panel:  Recent Labs  04/12/14 0424  HDL 58   Cardiac Enzymes:  Recent Labs  04/10/14 0938 04/10/14 1552 04/10/14 2002  TROPONINI 0.05* 0.05* 0.15*   CBG:  Recent Labs  04/18/14 0736 04/18/14 1247  04/18/14 1656  GLUCAP 156* 158* 149*    Dg Chest 2 View  04/09/2014   CLINICAL DATA:  Weakness rete 3 days, leg swelling for few months, dry cough for 2 weeks, hypertension, diabetes  EXAM: CHEST  2 VIEW  COMPARISON:  08/18/2013  FINDINGS: Enlargement of cardiac silhouette.  Mediastinal contours and pulmonary vascularity normal.  Lungs clear.  Tiny LEFT pleural effusion.  No pneumothorax.  Bones unremarkable.  IMPRESSION: Enlargement of cardiac silhouette.  Tiny LEFT pleural effusion.   Electronically Signed   By: Lavonia Dana M.D.   On: 04/09/2014 17:25   Mr Jodene Nam Head Wo Contrast  04/13/2014   CLINICAL DATA:  Acute nonhemorrhagic right PCA territory infarcts. Bilateral MCA stenosis.  EXAM: MRA HEAD WITHOUT CONTRAST  TECHNIQUE: Angiographic images of the Circle of Willis were obtained using MRA technique without intravenous contrast.  COMPARISON:  MRI brain 04/12/2014.  Cerebral arteriogram 02/04/2007  FINDINGS: Tortuosity of the cervical internal carotid arteries is similar to the prior exam. There is mild narrowing within the anterior genu of the cavernous left internal carotid artery. No significant ICA stenosis is present otherwise.  Signal loss within the proximal left anterior cerebral artery suggests the moderate stenosis of up to 70%. More mild distal left M1 stenosis is similar to the prior study, approximately 50% relative to the more distal vessel. There is severe stenosis of a proximal inferior right M2 branch. The MCA bifurcations are otherwise intact. Mild to moderate distal small vessel attenuation is present bilaterally, worse on the right. The anterior communicating artery is patent. There is severe stenosis and proximal occlusion of the left A2 segment. Moderate irregularity is present in the more distal right a CA vessel.  The left vertebral artery is dominant vessel. The PICA origins are visualized and normal bilaterally. The basilar artery is within normal limits. There is mild narrowing  of the proximal P1 segments bilaterally. New the moderate superior stenosis is present within the right P3 segment, corresponding to the areas of infarction. Less prominent attenuation is present within the left PCA branch vessels.  IMPRESSION: 1. Moderate to severe focal stenosis within the right P3 segment corresponds with the area of acute infarction. 2. Moderate diffuse small vessel disease, most prominent in the right PCA branch vessels, right MCA branch vessels, and bilateral ACA vessels. 3. High-grade stenosis of the proximal left ACA and slightly more distal occlusion. 4. Mild M1 narrowing on the left is stable. 5. Mild to moderate proximal left A1 stenosis.   Electronically Signed   By: Bretta Bang.D.  On: 04/13/2014 14:22   Mr Brain Wo Contrast  04/12/2014   CLINICAL DATA:  69 year old male with left side weakness. Uncontrolled hypertension and diabetes. Worsening extremity swelling. Initial encounter.  EXAM: MRI HEAD WITHOUT CONTRAST  TECHNIQUE: Multiplanar, multiecho pulse sequences of the brain and surrounding structures were obtained without intravenous contrast.  COMPARISON:  Brain MRI 02/02/2007.  Cerebral angiogram 02/04/2007.  FINDINGS: Confluent restricted diffusion tracking from the right corona radiata into the deep gray matter nuclei as well as the right temporal stem. The mesial right temporal lobe is spared. There is some involvement of the right thalamus.  Associated T2 and FLAIR hyperintensity. No associated hemorrhage or significant mass effect.  Major intracranial vascular flow voids are stable.  No contralateral or posterior fossa restricted diffusion. Cerebral volume has decreased since 2008. Mild ex vacuo ventricle enlargement has occurred. Mildly progressed and widespread Patchy and confluent cerebral white matter T2 and FLAIR hyperintensity. Occasional small areas of cortical encephalomalacia in both MCA territories. Progressed chronic T2 abnormality in the deep gray matter  nuclei, especially the left thalamus. Increased T2 abnormality in the central on left pons. Cerebellum remains within normal limits.  No midline shift, mass effect, or evidence of intracranial mass lesion. No acute intracranial hemorrhage identified. Negative pituitary and cervicomedullary junction. Partially visible cervical spine degeneration. Bone marrow signal within normal limits. Visible internal auditory structures appear normal. Mastoids are clear. Mild paranasal sinus mucosal thickening is similar to the prior exam. Visualized orbit soft tissues are within normal limits. Visualized scalp soft tissues are within normal limits.  IMPRESSION: 1. Patchy acute infarct in the right hemisphere affecting the corona radiata and deep gray matter nuclei. No associated hemorrhage or mass effect. 2. 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   Electronically Signed   By: Lars Pinks M.D.   On: 04/12/2014 09:26   US Renal  04/10/2014   CLINICAL DATA:  Acute renal failure.  Initial encounter.  EXAM: RENAL/URINARY TRACT ULTRASOUND COMPLETE  COMPARISON:  None.  FINDINGS: Right Kidney:  Length: 10.3 cm. Increased renal parenchymal echogenicity is noted. Mild right-sided perinephric fluid is noted. No mass or hydronephrosis visualized.  Left Kidney:  Length: 11.9 cm. Mildly increased renal parenchymal echogenicity is noted. Mild left-sided perinephric fluid is seen. No mass or hydronephrosis visualized.  Bladder:  Minimal apparent echoes near the anterior bladder wall may be artifactual in nature. The bladder is otherwise unremarkable. Bilateral ureteral jets are visualized.  IMPRESSION: 1. Increased renal cortical echogenicity raises concern for medical renal disease. 2. No evidence of hydronephrosis.  Bilateral ureteral jets are seen. 3. Mild bilateral perinephric fluid noted, nonspecific in nature.   Electronically Signed   By: Garald Balding M.D.   On: 04/10/2014 04:31     ASSESSMENT/PLAN:  Acute renal failure with CKD stage III - vein mapping 04/14/14; follow-up with nephrology for fistula placement Acute right hemisphere CVA - for rehabilitation; continue PLavix 75 mg by mouth daily and enteric coated aspirin 325 mg by mouth daily; follow-up with Dr. Erlinda Hong neurology in 2 months Diabetes mellitus uncontrolled with renal complications - hemoglobin A1c 8.2; continue Lantus 5 units subcutaneous daily at bedtime Hypertension - continue Norvasc 10 mg by mouth daily, Coreg 12.5 mg by mouth twice a day and Lasix 120 mg by mouth twice a day Anasarca - continue Lasix 120 mg by mouth twice a day BPH - continue Flomax 0.4 mg by mouth daily Hypercholesterolemia - continue Lipitor 80 mg by mouth daily Constipation -  stable; continue MiraLAX 17 g by mouth daily Hepatitis C - antibody+; GI consult if treatment is needed   Goals of care:  Short-term rehabilitation    Labs/test ordered:  none    Spent 50 minutes in patient care.    [facility], NP [employer] [phone]

## 2014-04-20 ENCOUNTER — Non-Acute Institutional Stay (SKILLED_NURSING_FACILITY): Payer: Commercial Managed Care - HMO | Admitting: Internal Medicine

## 2014-04-20 ENCOUNTER — Telehealth: Payer: Self-pay | Admitting: Vascular Surgery

## 2014-04-20 ENCOUNTER — Other Ambulatory Visit: Payer: Self-pay | Admitting: *Deleted

## 2014-04-20 ENCOUNTER — Encounter: Payer: Self-pay | Admitting: Vascular Surgery

## 2014-04-20 DIAGNOSIS — K5900 Constipation, unspecified: Secondary | ICD-10-CM

## 2014-04-20 DIAGNOSIS — E1159 Type 2 diabetes mellitus with other circulatory complications: Secondary | ICD-10-CM | POA: Diagnosis not present

## 2014-04-20 DIAGNOSIS — N179 Acute kidney failure, unspecified: Secondary | ICD-10-CM

## 2014-04-20 DIAGNOSIS — R109 Unspecified abdominal pain: Secondary | ICD-10-CM

## 2014-04-20 DIAGNOSIS — I639 Cerebral infarction, unspecified: Secondary | ICD-10-CM

## 2014-04-20 DIAGNOSIS — N186 End stage renal disease: Secondary | ICD-10-CM

## 2014-04-20 DIAGNOSIS — R601 Generalized edema: Secondary | ICD-10-CM | POA: Diagnosis not present

## 2014-04-20 DIAGNOSIS — I10 Essential (primary) hypertension: Secondary | ICD-10-CM

## 2014-04-20 DIAGNOSIS — E7800 Pure hypercholesterolemia, unspecified: Secondary | ICD-10-CM

## 2014-04-20 NOTE — Telephone Encounter (Addendum)
-----   Message from Mena Goes, RN sent at 04/17/2014  4:02 PM EST ----- Regarding: Schedule   ----- Message -----    From: Conrad Quitaque, MD    Sent: 04/16/2014  10:14 AM      To: [facility] Charge Pool  Nicanor Mendolia [id number] 03-03-1946  Level 4 consult  Follow-up: 1 month  Orders(s) for follow-up: BUE Arterial doppler, BLE vein mapping   notified patient of appointment with dr. Bridgett Larsson on 05-27-14 at 12pm, and mailed appointment letter

## 2014-04-20 NOTE — Progress Notes (Signed)
[name], male   DOB: [birth year], 69 y.o.   MRN: [id number]     [facility]   PCP: Philis Fendt, MD  Code Status: full code  No Known Allergies  Chief Complaint  Patient presents with  . New Admit To SNF     HPI:  69 year old patient is here for short term rehabilitation post hospital admission from 04/09/14-04/18/14 with generalized weakness, increased leg edema and abdominal distension. He was noted to have acute on chronic renal failure. Nephrology was consulted. He was given diuretics, underwent vein mapping for fistula placement. He diuresed well. He also had acute right hemisphere CVA. Neurology was consulted, he was put on aspirin and holter monitoring was recommended.  He has past medical history of uncontrolled hypertension, diabetes mellitus, CVA, chronic kidney disease. He is seen in his room today. He complaints of abdominal discomfort. He had a bowel movement yesterday. He has ben passing flatus. Complaints of generalized weakness.   Review of Systems:  Constitutional: Negative for fever, chills, diaphoresis.  HENT: Negative for headache, congestion Eyes: Negative for eye pain, blurred vision, double vision and discharge.  Respiratory: Negative for cough, shortness of breath and wheezing.   Cardiovascular: Negative for chest pain, palpitations, leg swelling.  Gastrointestinal: Negative for heartburn, nausea, vomiting Genitourinary: has foley in place Skin: Negative for itching, rash.  Neurological: Negative for dizziness, tingling, focal weakness Psychiatric/Behavioral: Negative for depression    Past Medical History  Diagnosis Date  . Hypertension   . Hypercholesterolemia   . Diabetes mellitus without complication   . Glaucoma   . Stroke     a. 01/2007 L corona radiata infarct.  . Cerebrovascular disease     a. 01/2007 MRI/A: L MCA 30-16%, R MCA 01%, LICA 09%.  . CKD (chronic kidney disease), stage  III   . Chronic combined systolic and diastolic CHF, NYHA class 2     a. 01/2007 Echo: EF 60-65%;  b. 03/2014 Echo: EF 45-50%, Gr 1 DD, severe LVH.  Marland Kitchen Headache    Past Surgical History  Procedure Laterality Date  . Colonoscopy w/ biopsies and polypectomy    . Colon surgery    . Trabeculectomy Left 08/18/2013    Procedure: TRABECULECTOMY WITH TUBE WITH [facility];  Surgeon: [name], MD;  Location: [facility];  Service: Ophthalmology;  Laterality: Left;   Social History:   reports that he has never smoked. He has never used smokeless tobacco. He reports that he does not drink alcohol or use illicit drugs.  Family History  Problem Relation Age of Onset  . Other      Negative for premature CAD.    Medications: Patient's Medications  New Prescriptions   No medications on file  Previous Medications   AMLODIPINE (NORVASC) 10 MG TABLET    Take 1 tablet (10 mg total) by mouth daily.   ATORVASTATIN (LIPITOR) 80 MG TABLET    Take 1 tablet (80 mg total) by mouth daily at 6 PM.   CARVEDILOL (COREG) 12.5 MG TABLET    Take 1 tablet (12.5 mg total) by mouth 2 (two) times daily with a meal.   CLOPIDOGREL (PLAVIX) 75 MG TABLET    Take 1 tablet (75 mg total) by mouth daily.   FUROSEMIDE (LASIX) 40 MG TABLET    Take 3 tablets (120 mg total) by mouth 2 (two) times daily.   HYDROCODONE-ACETAMINOPHEN (NORCO/VICODIN) 5-325 MG PER TABLET    Take 1 tablet by mouth every  4 (four) hours as needed for moderate pain.   INSULIN GLARGINE (LANTUS) 100 UNIT/ML INJECTION    Inject 0.05 mLs (5 Units total) into the skin at bedtime.   POLYETHYLENE GLYCOL (MIRALAX / GLYCOLAX) PACKET    Take 17 g by mouth daily.   TAMSULOSIN (FLOMAX) 0.4 MG CAPS CAPSULE    Take 0.4 mg by mouth.  Modified Medications   No medications on file  Discontinued Medications   No medications on file     Physical Exam: Filed Vitals:   04/20/14 1710  BP: 149/80  Pulse: 72  Temp: 97.3 F (36.3 C)  Resp: 18  Weight: 147 lb (66.679 kg)  SpO2:  96%    General- elderly male, in no acute distress Head- normocephalic, atraumatic Throat- moist mucus membrane Cardiovascular- normal s1,s2, no murmurs/, palpable dorsalis pedis and radial pulses, 1+ leg edema Respiratory- bilateral clear to auscultation, no wheeze, no rhonchi, no crackles, no use of accessory muscles Abdomen- bowel sounds present, soft, distended, tender to palpation, no guarding, foley in place Musculoskeletal- able to move RUE, unable to move LUE and has both LE weakness  Neurological- no focal deficit Skin- warm and dry   Labs reviewed: Basic Metabolic Panel:  Recent Labs  04/16/14 0358 04/17/14 0541 04/18/14 0516  NA 134* 129* 131*  K 4.0 4.0 4.0  CL 96 91* 92*  CO2 28 29 28   GLUCOSE 117* 237* 171*  BUN 53* 59* 61*  CREATININE 2.76* 2.95* 3.05*  CALCIUM 8.2* 7.8* 7.9*  PHOS 4.4 4.9* 4.7*   Liver Function Tests:  Recent Labs  04/09/14 1523  04/16/14 0358 04/17/14 0541 04/18/14 0516  AST 42*  --   --   --   --   ALT 31  --   --   --   --   ALKPHOS 102  --   --   --   --   BILITOT 0.5  --   --   --   --   PROT 5.5*  --   --   --   --   ALBUMIN 1.8*  < > 1.7* 1.7* 1.6*  < > = values in this interval not displayed. No results for input(s): LIPASE, AMYLASE in the last 8760 hours. No results for input(s): AMMONIA in the last 8760 hours. CBC:  Recent Labs  04/09/14 1523  04/13/14 0553 04/14/14 0915 04/15/14 0427  WBC 9.7  < > 8.0 6.9 5.7  NEUTROABS 7.6  --   --   --   --   HGB 11.9*  < > 10.0* 10.6* 10.8*  HCT 35.0*  < > 29.2* 31.3* 32.4*  MCV 89.7  < > 89.6 90.2 91.5  PLT 261  < > 240 297 292  < > = values in this interval not displayed. Cardiac Enzymes:  Recent Labs  04/10/14 0938 04/10/14 1552 04/10/14 2002  TROPONINI 0.05* 0.05* 0.15*   BNP: Invalid input(s): POCBNP CBG:  Recent Labs  04/18/14 0736 04/18/14 1247 04/18/14 1656  GLUCAP 156* 158* 149*     Assessment/Plan  Acute CVA Has bilateral lower extremity  weakness and LUE weakness. Will have him work with physical therapy and occupational therapy team to help with gait training and muscle strengthening exercises.fall precautions. Skin care. Encourage to be out of bed. Continue aspirin and plavix, has f/u with neurology  Acute on chronic renal failure Pending fistula placement with plan for dialysis, monitor renal function, has f/u with nephrology  Abdominal discomfort Flush his foley  to help with urinary outflow, also get KUB to rule out ileus  Generalized anasarca Continue lasix 120 mg bid, monitor weight and bmp  Diabetes mellitus uncontrolled with renal complications Monitor cbg continue Lantus 5 units daily  Hypertension Stable, continue Norvasc 10 mg daily, Coreg 12.5 mg bid and Lasix 120 mg bid  Hypercholesterolemia continue Lipitor 80 mg daily  Constipation continue miralax for now, add stool softner if KUB shows stool overload   Goals of care: short term rehabilitation    Labs/tests ordered: cbc, cmp 04/21/14, kub stat, foley care  Family/ staff Communication: reviewed care plan with patient and nursing supervisor    Blanchie Serve, MD  [facility] [phone] (Monday-Friday 8 am - 5 pm) [phone] (afterhours)

## 2014-05-26 ENCOUNTER — Encounter: Payer: Self-pay | Admitting: Vascular Surgery

## 2014-05-27 ENCOUNTER — Ambulatory Visit (INDEPENDENT_AMBULATORY_CARE_PROVIDER_SITE_OTHER)
Admission: RE | Admit: 2014-05-27 | Discharge: 2014-05-27 | Disposition: A | Discharge status: Home / Self Care | Payer: Commercial Managed Care - HMO | Source: Ambulatory Visit | Attending: Vascular Surgery | Admitting: Vascular Surgery

## 2014-05-27 ENCOUNTER — Ambulatory Visit (HOSPITAL_COMMUNITY)
Admission: RE | Admit: 2014-05-27 | Discharge: 2014-05-27 | Disposition: A | Discharge status: Home / Self Care | Payer: Commercial Managed Care - HMO | Source: Ambulatory Visit | Attending: Vascular Surgery | Admitting: Vascular Surgery

## 2014-05-27 ENCOUNTER — Ambulatory Visit (INDEPENDENT_AMBULATORY_CARE_PROVIDER_SITE_OTHER): Payer: Commercial Managed Care - HMO | Admitting: Vascular Surgery

## 2014-05-27 ENCOUNTER — Encounter: Payer: Self-pay | Admitting: Vascular Surgery

## 2014-05-27 VITALS — BP 161/71 | HR 95 | Resp 18 | Ht 65.0 in | Wt 147.0 lb

## 2014-05-27 DIAGNOSIS — N183 Chronic kidney disease, stage 3 unspecified: Secondary | ICD-10-CM

## 2014-05-27 DIAGNOSIS — N184 Chronic kidney disease, stage 4 (severe): Secondary | ICD-10-CM

## 2014-05-27 DIAGNOSIS — N185 Chronic kidney disease, stage 5: Secondary | ICD-10-CM

## 2014-05-27 DIAGNOSIS — Z01810 Encounter for preprocedural cardiovascular examination: Secondary | ICD-10-CM

## 2014-05-27 DIAGNOSIS — N186 End stage renal disease: Secondary | ICD-10-CM

## 2014-05-27 HISTORY — DX: Chronic kidney disease, stage 4 (severe): N18.4

## 2014-05-27 NOTE — Progress Notes (Signed)
    Established Dialysis Access  History of Present Illness  [name] is a 69 y.o. (1945/09/25) male who presents for re-evaluation for permanent access.  The patient is right hand dominant.  The patient has never had a previous PPM placed.  The patient was recently seen in the hospital after a acute CVA.  The patient has been in rehab for the CVA.  He returns today for re-evaluation for permanent access placement.  The patient's PMH, PSH, SH, FamHx, Med, and Allergies are unchanged from 04/16/14.  On ROS today: improved left arm use, still unresolved  neurologic deficits  Physical Examination  Filed Vitals:   05/27/14 1302  BP: 161/71  Pulse: 95  Resp: 18  Height: 5\' 5"  (1.651 m)  Weight: 147 lb (66.679 kg)   Body mass index is 24.46 kg/(m^2).  General: A&O x 3, WD, WN  Pulmonary: Sym exp, good air movt, CTAB, no rales, rhonchi, & wheezing  Cardiac: RRR, Nl S1, S2, no Murmurs, rubs or gallops  Vascular: Vessel Right Left  Radial Palpable Palpable  Ulnar Not Palpable Not Palpable  Brachial Faintly Palpable Faintly Palpable  Carotid Palpable, without bruit Palpable, without bruit  Aorta Not palpable N/A  Femoral Palpable Palpable  Popliteal Not palpable Not palpable  PT Not Palpable Not Palpable  DP Not Palpable Not Palpable   Gastrointestinal: soft, NTND, -G/R, - HSM, - masses, - CVAT B  Musculoskeletal: RUE and RLE: 4/5, LUE 4/5, LLE 1-2/5 , Extremities without ischemic changes , able to nearly fully extend L arm today  Neurologic: Pain and light touch intact in extremities , Motor exam as listed above   Non-Invasive Vascular Imaging  BUE Arterial Doppler (Date: 05/27/2014):   R: acceptable brachial and radial artery  L: acceptable brachial and radial artery.  Vein Mapping  (Date: 05/27/2014):   R arm: acceptable vein conduits include upper arm basilic  L arm: acceptable vein conduits include upper arm basilic  Medical  Decision Making  [name] is a 69 y.o. male who presents with chronic kidney disease stage III-IV   Based on vein mapping and examination, this patient's permanent access options include: B staged BVT.  I would start with L 1st stage BVT  I had an extensive discussion with this patient in regards to the nature of access surgery, including risk, benefits, and alternatives.    The patient is aware that the risks of access surgery include but are not limited to: bleeding, infection, steal syndrome, nerve damage, ischemic monomelic neuropathy, failure of access to mature, and possible need for additional access procedures in the future.  The patient is aware that I construct transposition procedures in two stages, requiring a separate operation to complete the procedure.  The patient has not agreed to proceed with the above procedure.  The patient family is going to discuss his options with his nephrology later this month prior to proceeding.  Adele Barthel, MD [facility] Office: [phone] Pager: [phone]  05/27/2014, 1:46 PM

## 2014-05-31 ENCOUNTER — Non-Acute Institutional Stay (SKILLED_NURSING_FACILITY): Payer: Commercial Managed Care - HMO | Admitting: Adult Health

## 2014-05-31 ENCOUNTER — Encounter: Payer: Self-pay | Admitting: Adult Health

## 2014-05-31 DIAGNOSIS — N183 Chronic kidney disease, stage 3 unspecified: Secondary | ICD-10-CM

## 2014-05-31 DIAGNOSIS — I639 Cerebral infarction, unspecified: Secondary | ICD-10-CM

## 2014-05-31 DIAGNOSIS — K5900 Constipation, unspecified: Secondary | ICD-10-CM

## 2014-05-31 DIAGNOSIS — B1920 Unspecified viral hepatitis C without hepatic coma: Secondary | ICD-10-CM

## 2014-05-31 DIAGNOSIS — N400 Enlarged prostate without lower urinary tract symptoms: Secondary | ICD-10-CM

## 2014-05-31 DIAGNOSIS — E43 Unspecified severe protein-calorie malnutrition: Secondary | ICD-10-CM

## 2014-05-31 DIAGNOSIS — I10 Essential (primary) hypertension: Secondary | ICD-10-CM

## 2014-05-31 DIAGNOSIS — E7800 Pure hypercholesterolemia, unspecified: Secondary | ICD-10-CM

## 2014-05-31 DIAGNOSIS — E119 Type 2 diabetes mellitus without complications: Secondary | ICD-10-CM | POA: Diagnosis not present

## 2014-05-31 NOTE — Progress Notes (Signed)
[name], male   DOB: [birth year], 69 y.o.   MRN: [id number]   05/31/2014  Facility:  Nursing Home Location:  [place] Room Number: 220-U LEVEL OF CARE:  SNF (31)   Chief Complaint  Patient presents with  . Medical Management of Chronic Issues    CVA, diabetes mellitus, constipation, UTI, hep C, CKD, protein calorie malnutrition, BPH, hypertension, and hyperlipidemia    HISTORY OF PRESENT ILLNESS:  This is a 69 year old male who was being seen on a routine visit. He is at [facility] for short-term rehabilitation due to a recent CVA. His foley catheter was recently discontinued since Foley catheter created a wound on is meatus and is currently being treated for UTI with Cipro. His Lantus was recently increased to 12 units due to elevated blood sugars. A Neurologic ophthalmologist consultation was recently ordered due to patient noted to have difficulty keeping his eyes open and poor visual tracking. He has recently been diagnosed with Protein-Calorie Malnutrition, Severe with albumin of 2.1. Protein supplementation is being done.  PAST MEDICAL HISTORY:  Past Medical History  Diagnosis Date  . Hypertension   . Hypercholesterolemia   . Diabetes mellitus without complication   . Glaucoma   . Stroke     a. 01/2007 L corona radiata infarct.  . Cerebrovascular disease     a. 01/2007 MRI/A: L MCA 54-27%, R MCA 06%, LICA 23%.  . CKD (chronic kidney disease), stage III   . Chronic combined systolic and diastolic CHF, NYHA class 2     a. 01/2007 Echo: EF 60-65%;  b. 03/2014 Echo: EF 45-50%, Gr 1 DD, severe LVH.  Marland Kitchen Headache     CURRENT MEDICATIONS: Reviewed per MAR/see medication list  No Known Allergies   REVIEW OF SYSTEMS:  GENERAL: no change in appetite, no fever, chills  RESPIRATORY: no cough, SOB, DOE, wheezing, hemoptysis CARDIAC: no chest pain, or palpitations GI: no abdominal pain, diarrhea, constipation, heart burn, nausea  or vomiting  PHYSICAL EXAMINATION  GENERAL: no acute distress, normal body habitus EYES: conjunctivae normal, sclerae normal, normal eye lids NECK: supple, trachea midline, no neck masses, no thyroid tenderness, no thyromegaly LYMPHATICS: no LAN in the neck, no supraclavicular LAN RESPIRATORY: breathing is even & unlabored, BS CTAB CARDIAC: RRR, no murmur,no extra heart sounds, no edema GI: abdomen soft, normal BS, no masses, no tenderness, no hepatomegaly, no splenomegaly EXTREMITIES: able to move only the RUE; unable to move BLE and LUE NEURO:  unable to move BLE and LUE; has difficulty keeping eyes o PSYCHIATRIC: the patient is alert & oriented to person, affect & behavior appropriate  LABS/RADIOLOGY:  05/21/14 WBC 5.8 hemoglobin 10.0 hematocrit 29.7 sodium 139 potassium 3.7 glucose 82 BUN 58 creatinine 2.05 total bilirubin 0.3 SGOT 58 SGPT 54 total protein 5.0 albumin 2.1 calcium 7.9 05/20/14  urine culture >= 100,000 colonies/mL Pseudomonas aeruginosa  05/02/14 hemoglobin A1c 8.2 04/20/14  abdomen x-ray shows nonspecific but nonobstructive appearing gastrointestinal tract gas pattern; correlate clinically for colonic constipation or fecal stasis Labs reviewed: Basic Metabolic Panel:  Recent Labs  04/16/14 0358 04/17/14 0541 04/18/14 0516  NA 134* 129* 131*  K 4.0 4.0 4.0  CL 96 91* 92*  CO2 28 29 28   GLUCOSE 117* 237* 171*  BUN 53* 59* 61*  CREATININE 2.76* 2.95* 3.05*  CALCIUM 8.2* 7.8* 7.9*  PHOS 4.4 4.9* 4.7*   Liver Function Tests:  Recent Labs  04/09/14 1523  04/16/14 0358 04/17/14 0541 04/18/14  0516  AST 42*  --   --   --   --   ALT 31  --   --   --   --   ALKPHOS 102  --   --   --   --   BILITOT 0.5  --   --   --   --   PROT 5.5*  --   --   --   --   ALBUMIN 1.8*  < > 1.7* 1.7* 1.6*  < > = values in this interval not displayed.  CBC:  Recent Labs  04/09/14 1523  04/13/14 0553 04/14/14 0915 04/15/14 0427  WBC 9.7  < > 8.0 6.9 5.7  NEUTROABS 7.6   --   --   --   --   HGB 11.9*  < > 10.0* 10.6* 10.8*  HCT 35.0*  < > 29.2* 31.3* 32.4*  MCV 89.7  < > 89.6 90.2 91.5  PLT 261  < > 240 297 292  < > = values in this interval not displayed.  Lipid Panel:  Recent Labs  04/12/14 0424  HDL 58   Cardiac Enzymes:  Recent Labs  04/10/14 0938 04/10/14 1552 04/10/14 2002  TROPONINI 0.05* 0.05* 0.15*   CBG:  Recent Labs  04/18/14 0736 04/18/14 1247 04/18/14 1656  GLUCAP 156* 158* 149*    ASSESSMENT/PLAN:  CKD stage III - vein mapping recently done on 05/27/14; follow-up with nephrology for fistula placement Acute right hemisphere CVA - continue rehabilitation; continue PLavix 75 mg by mouth daily and enteric coated aspirin 325 mg by mouth daily; follow-up with Dr. Erlinda Hong neurology and neurology opthalmologist Diabetes mellitus uncontrolled with renal complications - hemoglobin A1c 8.2; continue Lantus 12 units subcutaneous daily at bedtime and NovoLog 5 units subcutaneous twice a day for CVG>= 200 Hypertension - continue Norvasc 10 mg by mouth daily, Coreg 12.5 mg by mouth twice a day and Lasix 120 mg by mouth twice a day BPH - continue Flomax 0.4 mg by mouth daily Hypercholesterolemia - continue Lipitor 80 mg by mouth daily Constipation - recently increased MiraLAX 17 g by mouth BID and Senna-S 2 tabs PO BID Hepatitis C - antibody+; GI consult if treatment is needed UTI - continue Cipro; foley catheter was recently discontinued Protein-Calorie Malnutrition, Severe - albumin 2.1; continue supplementation.   Goals of care:  Short-term rehabilitation   Labs/test ordered:  none      [place], [place]

## 2014-06-15 ENCOUNTER — Ambulatory Visit (INDEPENDENT_AMBULATORY_CARE_PROVIDER_SITE_OTHER): Payer: Commercial Managed Care - HMO | Admitting: Neurology

## 2014-06-15 ENCOUNTER — Encounter: Payer: Self-pay | Admitting: Neurology

## 2014-06-15 ENCOUNTER — Encounter: Payer: Self-pay | Admitting: *Deleted

## 2014-06-15 VITALS — BP 153/82 | HR 67

## 2014-06-15 DIAGNOSIS — I63031 Cerebral infarction due to thrombosis of right carotid artery: Secondary | ICD-10-CM | POA: Diagnosis not present

## 2014-06-15 DIAGNOSIS — I10 Essential (primary) hypertension: Secondary | ICD-10-CM | POA: Diagnosis not present

## 2014-06-15 DIAGNOSIS — H53453 Other localized visual field defect, bilateral: Secondary | ICD-10-CM | POA: Diagnosis not present

## 2014-06-15 DIAGNOSIS — E1159 Type 2 diabetes mellitus with other circulatory complications: Secondary | ICD-10-CM | POA: Diagnosis not present

## 2014-06-15 DIAGNOSIS — E785 Hyperlipidemia, unspecified: Secondary | ICD-10-CM

## 2014-06-15 NOTE — Progress Notes (Signed)
STROKE NEUROLOGY FOLLOW UP NOTE  NAME: [name] DOB: 1945/07/23  REASON FOR VISIT: stroke follow up HISTORY FROM: chart  Today we had the pleasure of seeing [name] in follow-up at our Neurology Clinic. Pt was accompanied by no one.   History Summary [name] is a 69 y.o. male with history of uncontrolled hypertension, diabetes mellitus, prior history of CVA 2008 at left corona radiata, acute on chronic kidney disease who was admitted on 04/09/14 for worsening swelling of his lower extremities, abdominal distention, overall generalized weakness. In hospital he developed LUE weakness and worsening vision. MRI showed right anterior choroidal artery territory infarct likely secondary to large vessel atherosclerosis. LDL 185 and A1C 8.2. His risk factor for stroke including uncontrolled HTN, DM, prior stroke, diffuse athero, severe WM ischemic changes, CKD and HLD. cardioembolic AchA stroke considered less likely with many risk factors like these and also incomplete AChA infarct. His ASA switched to plavix and put on high dose lipitor and he was discharged to [facility].   Interval History During the interval time, the patient has been doing the same except LUE improvement on strength. He went to NH after discharge from CIR. He is still in NH and need constant nursing care. He still wheelchair bound. Had been referred to neuroophthalmologist for poor visual acuity and visual field deficit. His foley catheter was removed due to lesion in penis. Now he is on condom catheter. However, he is still on both ASA 325 and plavix. His BP still high at 153/82 today in clinic.  REVIEW OF SYSTEMS: Full 14 system review of systems performed and notable only for those listed below and in HPI above, all others are negative:  Constitutional:   Cardiovascular:  Ear/Nose/Throat:    Skin:  Eyes:   Respiratory:   Gastroitestinal:   Genitourinary:  Hematology/Lymphatic:   Endocrine:    Musculoskeletal:   Allergy/Immunology:   Neurological:   Psychiatric:  Sleep:   The following represents the patient's updated allergies and side effects list: No Known Allergies  The neurologically relevant items on the patient's problem list were reviewed on today's visit.  Neurologic Examination  A problem focused neurological exam (12 or more points of the single system neurologic examination, vital signs counts as 1 point, cranial nerves count for 8 points) was performed.  Blood pressure 153/82, pulse 67, height 0' (0 m), weight 0 lb (0 kg).  General - Well nourished, well developed, in no apparent distress, leaning to the left side on wheelchair.  Ophthalmologic - fundi not visualized due to incorporation.   Cardiovascular - Regular rate and rhythm.  Mental Status -  Level of arousal and orientation to time, place, and person were intact. Language including expression, naming, repetition, comprehension was assessed and found to have partial naming difficulty and slow in response. Moderate dysarthria.  Cranial Nerves II - XII - II - Visual field testing showed preserved central vision, but deficit on peripheral vision bilaterally, may related to combination of stroke and glaucoma. Decreased visual acuity bilaterally too. III, IV, VI - Extraocular movements intact. V - Facial sensation intact bilaterally. VII - left nasolabial fold flatening. VIII - Hearing & vestibular intact bilaterally. X - Palate elevates symmetrically, mild to moderate dysarthria. XI - Chin turning & shoulder shrug intact bilaterally. XII - Tongue protrusion intact.  Motor Strength - The patient's strength was 4+/5 right UE, but 3/5 left UE, right LE on ASO 3-/5 proximal and 0/5 distal, and LLE 2/5 proximal and 0/5  distal. Bulk was normal and fasciculations were absent.  Motor Tone - Muscle tone was increased bilateral LEs, normal UEs.  Reflexes - The patient's reflexes were increased at RLE and  he had no pathological reflexes.  Sensory - Light touch, temperature/pinprick were assessed and were normal.   Coordination - The patient had normal movements in the right hand with no ataxia or dysmetria, but slow in action. Tremor was absent.  Gait and Station - not tested due to weakness.  Data reviewed: I personally reviewed the images and agree with the radiology interpretations.  Dg Chest 2 View  04/09/2014 Enlargement of cardiac silhouette. Tiny LEFT pleural effusion.   Mr Brain Wo Contrast  04/12/2014 1. Patchy acute infarct in the right hemisphere affecting the corona radiata and deep gray matter nuclei. No associated hemorrhage or mass effect. 2. 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 The infarct territory is in anterior choroidal artery.   MRA head 1. Moderate to severe focal stenosis within the right P3 segment corresponds with the area of acute infarction. 2. Moderate diffuse small vessel disease, most prominent in the right PCA branch vessels, right MCA branch vessels, and bilateral ACA vessels. 3. High-grade stenosis of the proximal left ACA and slightly more distal occlusion. 4. Mild M1 narrowing on the left is stable. 5. Mild to moderate proximal left A1 stenosis.  US Renal  04/10/2014 1. Increased renal cortical echogenicity raises concern for medical renal disease. 2. No evidence of hydronephrosis. Bilateral ureteral jets are seen. 3. Mild bilateral perinephric fluid noted, nonspecific in nature.   Carotid Doppler   There is 1-39% bilateral ICA stenosis. Vertebral artery flow is antegrade.   2D Echocardiogram   Left ventricle: The cavity size was normal. Wall thickness was increased in a pattern of severe LVH. Systolic function wasmildly reduced. The estimated ejection fraction was in the rangeof 45% to 50%. Wall motion was normal; there were no regionalwall motion abnormalities. Doppler parameters  are consistent with abnormal left ventricular relaxation (grade 1 diastolicdysfunction).  Component     Latest Ref Rng 04/09/2014 04/10/2014 04/12/2014  Cholesterol     0 - 200 mg/dL   268 (H)  Triglycerides     <150 mg/dL   124  HDL     >39 mg/dL   58  Total CHOL/HDL Ratio        4.6  VLDL     0 - 40 mg/dL   25  LDL (calc)     0 - 99 mg/dL   185 (H)  Hemoglobin A1C     <5.7 % 8.3 (H) 8.2 (H)   Mean Plasma Glucose     <117 mg/dL 192 (H) 189 (H)   Myeloperoxidase Abs     <1.0 AI  <1.0   Serine Protease 3     <1.0 AI  <1.0   HIV 1/O/2 Abs-Index Value     <1.00  <1.00   HIV-1/HIV-2 Ab     Non Reactive  Non Reactive   Anit Nuclear Antibody(ANA)     NEGATIVE  NEGATIVE   C3 Complement     90 - 180 mg/dL  134   Complement C4, Body Fluid     10 - 40 mg/dL  35     Assessment: As you may recall, he is a 69 y.o. African American male with PMH of uncontrolledHTN, DM, prior CVA in 2008 at left corona radiata, CKD who was admitted on 04/09/14 for right AchA territory infarct likely  secondary to large vessel atherosclerosis. MRA showed diffuse athero, LDL 185 and A1C 8.2. His risk factor for stroke including uncontrolled HTN, DM, HLD, prior stroke, diffuse athero, severe WM ischemic changes, and CKD. Cardioembolic AchA stroke considered less likely with significant risk factors and also incomplete AChA infarct. He is currently on both ASA 325 and plavix and high dose lipitor. Will switch to plavix monotherapy.     Plan:  - discontinue ASA and continue plavix monotherapy - continue lipitor for stroke prevention and HLD - continue aggressive PT/OT/speech - agree with neurophthalmology consultation. - Follow up with your primary care physician for stroke risk factor modification. Recommend maintain blood pressure goal <130/80, diabetes with hemoglobin A1c goal below 6.5% and lipids with LDL cholesterol goal below 70 mg/dL.  - RTC in 2 months.  No orders of the defined types were placed in  this encounter.    Meds ordered this encounter  Medications  . insulin lispro (HUMALOG) 100 UNIT/ML cartridge    Sig: Inject 100 Units into the skin 3 (three) times daily with meals.  . senna (SENOKOT) 8.6 MG tablet    Sig: Take 1 tablet by mouth 2 (two) times daily.  . ciprofloxacin (CIPRO) 500 MG tablet    Sig: Take 500 mg by mouth 2 (two) times daily.  . Multiple Vitamins-Minerals (DECUBI-VITE PO)    Sig: Take 1 tablet by mouth daily.     Rosalin Hawking, MD PhD [facility] [address], [place], [place] [address] [phone]

## 2014-06-16 DIAGNOSIS — I63031 Cerebral infarction due to thrombosis of right carotid artery: Secondary | ICD-10-CM | POA: Insufficient documentation

## 2014-06-16 DIAGNOSIS — E785 Hyperlipidemia, unspecified: Secondary | ICD-10-CM | POA: Insufficient documentation

## 2014-06-16 DIAGNOSIS — H53459 Other localized visual field defect, unspecified eye: Secondary | ICD-10-CM | POA: Insufficient documentation

## 2014-06-23 ENCOUNTER — Encounter: Payer: Self-pay | Admitting: Adult Health

## 2014-06-23 ENCOUNTER — Non-Acute Institutional Stay: Payer: Commercial Managed Care - HMO | Admitting: Adult Health

## 2014-06-23 DIAGNOSIS — B1920 Unspecified viral hepatitis C without hepatic coma: Secondary | ICD-10-CM | POA: Diagnosis not present

## 2014-06-23 DIAGNOSIS — E7800 Pure hypercholesterolemia, unspecified: Secondary | ICD-10-CM

## 2014-06-23 DIAGNOSIS — E119 Type 2 diabetes mellitus without complications: Secondary | ICD-10-CM

## 2014-06-23 DIAGNOSIS — K5900 Constipation, unspecified: Secondary | ICD-10-CM

## 2014-06-23 DIAGNOSIS — I63031 Cerebral infarction due to thrombosis of right carotid artery: Secondary | ICD-10-CM

## 2014-06-23 DIAGNOSIS — I10 Essential (primary) hypertension: Secondary | ICD-10-CM | POA: Diagnosis not present

## 2014-06-23 DIAGNOSIS — E43 Unspecified severe protein-calorie malnutrition: Secondary | ICD-10-CM

## 2014-06-23 DIAGNOSIS — N183 Chronic kidney disease, stage 3 unspecified: Secondary | ICD-10-CM

## 2014-06-23 DIAGNOSIS — N400 Enlarged prostate without lower urinary tract symptoms: Secondary | ICD-10-CM | POA: Diagnosis not present

## 2014-06-23 NOTE — Progress Notes (Signed)
[name], male   DOB: 1945/09/02, 70 y.o.   MRN: [id number]   06/23/2014  Facility:  Nursing Home Location:  [facility] Room Number: 013-H LEVEL OF CARE:  SNF (31)   Chief Complaint  Patient presents with  . Discharge Note    CVA, diabetes mellitus, constipation, UTI, hep C, CKD, protein calorie malnutrition, BPH, hypertension, and hyperlipidemia    HISTORY OF PRESENT ILLNESS:  This is a 69 year old male who is for discharge home with home health PT for endurance, OT for ADLs, CNA for showers and Nurse for disease management. DME:  Semi-electric hospital bed, standard wheelchair with leg rests and cushion, and 3 in one bedside commode.  He has been admitted to [facility] on 04/18/14 from [facility]. He had short-term rehabilitation @t  [facility] due to a recent CVA. His foley catheter was recently discontinued since Foley catheter created a wound on is meatus.  His Lantus was recently increased to 12 units due to elevated blood sugars. A Neurologic ophthalmologist consultation was recently ordered due to patient noted to have difficulty keeping his eyes open and poor visual tracking. He has recently been diagnosed with Protein-Calorie Malnutrition, Severe with albumin of 2.1. Protein supplementation is being done.  PAST MEDICAL HISTORY:  Past Medical History  Diagnosis Date  . Hypertension   . Hypercholesterolemia   . Diabetes mellitus without complication   . Glaucoma   . Stroke     a. 01/2007 L corona radiata infarct.  . Cerebrovascular disease     a. 01/2007 MRI/A: L MCA 43-88%, R MCA 87%, LICA 57%.  . CKD (chronic kidney disease), stage III   . Chronic combined systolic and diastolic CHF, NYHA class 2     a. 01/2007 Echo: EF 60-65%;  b. 03/2014 Echo: EF 45-50%, Gr 1 DD, severe LVH.  Marland Kitchen Headache     CURRENT MEDICATIONS: Reviewed per MAR/see medication list  No Known Allergies   REVIEW OF SYSTEMS:  GENERAL: no  change in appetite, no fever, chills  RESPIRATORY: no cough, SOB, DOE, wheezing, hemoptysis CARDIAC: no chest pain, or palpitations GI: no abdominal pain, diarrhea, constipation, heart burn, nausea or vomiting  PHYSICAL EXAMINATION  GENERAL: no acute distress, normal body habitus NECK: supple, trachea midline, no neck masses, no thyroid tenderness, no thyromegaly LYMPHATICS: no LAN in the neck, no supraclavicular LAN RESPIRATORY: breathing is even & unlabored, BS CTAB CARDIAC: RRR, no murmur,no extra heart sounds, no edema GI: abdomen soft, normal BS, no masses, no tenderness, no hepatomegaly, no splenomegaly EXTREMITIES: RUE 4/5, LUE 3/5, RLE 3/5, LLE 2/5 NEURO:   Decreased peripheral vision PSYCHIATRIC: the patient is alert & oriented to person, affect & behavior appropriate  LABS/RADIOLOGY: 05/31/14  sodium 139 potassium 4.0 glucose 50 BUN 68 creatinine 2.0 calcium 8.2  05/21/14 WBC 5.8 hemoglobin 10.0 hematocrit 29.7 sodium 139 potassium 3.7 glucose 82 BUN 58 creatinine 2.05 total bilirubin 0.3 SGOT 58 SGPT 54 total protein 5.0 albumin 2.1 calcium 7.9 05/20/14  urine culture >= 100,000 colonies/mL Pseudomonas aeruginosa  05/02/14 hemoglobin A1c 8.2 04/20/14  abdomen x-ray shows nonspecific but nonobstructive appearing gastrointestinal tract gas pattern; correlate clinically for colonic constipation or fecal stasis Labs reviewed: Basic Metabolic Panel:  Recent Labs  04/16/14 0358 04/17/14 0541 04/18/14 0516  NA 134* 129* 131*  K 4.0 4.0 4.0  CL 96 91* 92*  CO2 28 29 28   GLUCOSE 117* 237* 171*  BUN 53* 59* 61*  CREATININE 2.76*  2.95* 3.05*  CALCIUM 8.2* 7.8* 7.9*  PHOS 4.4 4.9* 4.7*   Liver Function Tests:  Recent Labs  04/09/14 1523  04/16/14 0358 04/17/14 0541 04/18/14 0516  AST 42*  --   --   --   --   ALT 31  --   --   --   --   ALKPHOS 102  --   --   --   --   BILITOT 0.5  --   --   --   --   PROT 5.5*  --   --   --   --   ALBUMIN 1.8*  < > 1.7* 1.7* 1.6*  < >  = values in this interval not displayed.  CBC:  Recent Labs  04/09/14 1523  04/13/14 0553 04/14/14 0915 04/15/14 0427  WBC 9.7  < > 8.0 6.9 5.7  NEUTROABS 7.6  --   --   --   --   HGB 11.9*  < > 10.0* 10.6* 10.8*  HCT 35.0*  < > 29.2* 31.3* 32.4*  MCV 89.7  < > 89.6 90.2 91.5  PLT 261  < > 240 297 292  < > = values in this interval not displayed.  Lipid Panel:  Recent Labs  04/12/14 0424  HDL 58   Cardiac Enzymes:  Recent Labs  04/10/14 0938 04/10/14 1552 04/10/14 2002  TROPONINI 0.05* 0.05* 0.15*   CBG:  Recent Labs  04/18/14 0736 04/18/14 1247 04/18/14 1656  GLUCAP 156* 158* 149*    ASSESSMENT/PLAN:  CKD stage III - vein mapping recently done on 05/27/14; follow-up with nephrology for fistula placement Acute right hemisphere CVA - continue rehabilitation; continue PLavix 75 mg by mouth daily and enteric coated aspirin 325 mg by mouth daily; follow-up with Dr. Erlinda Hong neurology and neurology opthalmologist Diabetes mellitus uncontrolled with renal complications - hemoglobin A1c 8.2; continue Lantus 12 units subcutaneous daily at bedtime and NovoLog 5 units subcutaneous twice a day for CBG>= 200 Hypertension - continue Norvasc 10 mg by mouth daily, Coreg 12.5 mg by mouth twice a day and Lasix 120 mg by mouth twice a day BPH - continue Flomax 0.4 mg by mouth daily Hypercholesterolemia - continue Lipitor 80 mg by mouth daily Constipation - recently increased MiraLAX 17 g by mouth BID and Senna-S 2 tabs PO BID Hepatitis C - antibody+; GI consult if treatment is needed Protein-Calorie Malnutrition, Severe - albumin 2.1; continue supplementation.     I have filled out patient's discharge paperwork and written prescriptions.  Patient will receive home health PT, OT, Nursing and CNA.  DME provided:  Semi-electric hospital bed, standard wheelchair with leg rests and cushion, and 3 in one bedside commode  Total discharge time: Greater than 30 minutes  Discharge time  involved coordination of the discharge process with social worker, nursing staff and therapy department. Medical justification for home health services/DME verified.    [facility], NP [employer] [phone]

## 2014-07-25 ENCOUNTER — Other Ambulatory Visit: Payer: Self-pay | Admitting: Adult Health

## 2014-08-23 ENCOUNTER — Ambulatory Visit: Payer: Commercial Managed Care - HMO | Admitting: Neurology

## 2014-08-24 ENCOUNTER — Encounter: Payer: Self-pay | Admitting: Neurology

## 2014-09-16 ENCOUNTER — Inpatient Hospital Stay (HOSPITAL_COMMUNITY)
Admission: EM | Admit: 2014-09-16 | Discharge: 2014-09-17 | DRG: 313 | Disposition: A | Discharge status: Home / Self Care | Payer: Commercial Managed Care - HMO | Attending: Internal Medicine | Admitting: Internal Medicine

## 2014-09-16 ENCOUNTER — Encounter (HOSPITAL_COMMUNITY): Payer: Self-pay

## 2014-09-16 ENCOUNTER — Emergency Department (HOSPITAL_COMMUNITY): Payer: Commercial Managed Care - HMO

## 2014-09-16 DIAGNOSIS — R079 Chest pain, unspecified: Secondary | ICD-10-CM | POA: Diagnosis present

## 2014-09-16 DIAGNOSIS — Z7982 Long term (current) use of aspirin: Secondary | ICD-10-CM | POA: Diagnosis not present

## 2014-09-16 DIAGNOSIS — H409 Unspecified glaucoma: Secondary | ICD-10-CM | POA: Diagnosis present

## 2014-09-16 DIAGNOSIS — Z794 Long term (current) use of insulin: Secondary | ICD-10-CM | POA: Diagnosis not present

## 2014-09-16 DIAGNOSIS — E1159 Type 2 diabetes mellitus with other circulatory complications: Secondary | ICD-10-CM | POA: Diagnosis present

## 2014-09-16 DIAGNOSIS — Z8673 Personal history of transient ischemic attack (TIA), and cerebral infarction without residual deficits: Secondary | ICD-10-CM | POA: Diagnosis not present

## 2014-09-16 DIAGNOSIS — N183 Chronic kidney disease, stage 3 (moderate): Secondary | ICD-10-CM | POA: Diagnosis present

## 2014-09-16 DIAGNOSIS — Z7902 Long term (current) use of antithrombotics/antiplatelets: Secondary | ICD-10-CM

## 2014-09-16 DIAGNOSIS — I10 Essential (primary) hypertension: Secondary | ICD-10-CM | POA: Diagnosis present

## 2014-09-16 DIAGNOSIS — I5042 Chronic combined systolic (congestive) and diastolic (congestive) heart failure: Secondary | ICD-10-CM | POA: Diagnosis present

## 2014-09-16 DIAGNOSIS — E785 Hyperlipidemia, unspecified: Secondary | ICD-10-CM | POA: Diagnosis present

## 2014-09-16 DIAGNOSIS — I129 Hypertensive chronic kidney disease with stage 1 through stage 4 chronic kidney disease, or unspecified chronic kidney disease: Secondary | ICD-10-CM | POA: Diagnosis present

## 2014-09-16 DIAGNOSIS — I639 Cerebral infarction, unspecified: Secondary | ICD-10-CM | POA: Diagnosis present

## 2014-09-16 DIAGNOSIS — R7989 Other specified abnormal findings of blood chemistry: Secondary | ICD-10-CM

## 2014-09-16 DIAGNOSIS — R0789 Other chest pain: Secondary | ICD-10-CM | POA: Diagnosis present

## 2014-09-16 DIAGNOSIS — E1151 Type 2 diabetes mellitus with diabetic peripheral angiopathy without gangrene: Secondary | ICD-10-CM | POA: Diagnosis present

## 2014-09-16 HISTORY — DX: Pneumonia, unspecified organism: J18.9

## 2014-09-16 HISTORY — DX: Bed confinement status: Z74.01

## 2014-09-16 HISTORY — DX: Gastro-esophageal reflux disease without esophagitis: K21.9

## 2014-09-16 HISTORY — DX: Hypothyroidism, unspecified: E03.9

## 2014-09-16 HISTORY — DX: Inflammatory liver disease, unspecified: K75.9

## 2014-09-16 HISTORY — DX: Type 2 diabetes mellitus without complications: E11.9

## 2014-09-16 LAB — BASIC METABOLIC PANEL
Anion gap: 8 (ref 5–15)
BUN: 89 mg/dL — ABNORMAL HIGH (ref 6–20)
CALCIUM: 9.1 mg/dL (ref 8.9–10.3)
CO2: 27 mmol/L (ref 22–32)
Chloride: 108 mmol/L (ref 101–111)
Creatinine, Ser: 1.87 mg/dL — ABNORMAL HIGH (ref 0.61–1.24)
GFR calc Af Amer: 41 mL/min — ABNORMAL LOW (ref >60)
GFR calc non Af Amer: 35 mL/min — ABNORMAL LOW (ref >60)
GLUCOSE: 122 mg/dL — AB (ref 65–99)
Potassium: 3.8 mmol/L (ref 3.5–5.1)
SODIUM: 143 mmol/L (ref 135–145)

## 2014-09-16 LAB — CBC
HCT: 32.2 % — ABNORMAL LOW (ref 39.0–52.0)
HCT: 32.2 % — ABNORMAL LOW (ref 39.0–52.0)
Hemoglobin: 11.1 g/dL — ABNORMAL LOW (ref 13.0–17.0)
Hemoglobin: 11.2 g/dL — ABNORMAL LOW (ref 13.0–17.0)
MCH: 31.2 pg (ref 26.0–34.0)
MCH: 31.5 pg (ref 26.0–34.0)
MCHC: 34.5 g/dL (ref 30.0–36.0)
MCHC: 34.8 g/dL (ref 30.0–36.0)
MCV: 90.4 fL (ref 78.0–100.0)
MCV: 90.7 fL (ref 78.0–100.0)
PLATELETS: 174 10*3/uL (ref 150–400)
Platelets: 178 10*3/uL (ref 150–400)
RBC: 3.56 MIL/uL — AB (ref 4.22–5.81)
RBC: 3.55 MIL/uL — ABNORMAL LOW (ref 4.22–5.81)
RDW: 13.5 % (ref 11.5–15.5)
RDW: 13.5 % (ref 11.5–15.5)
WBC: 6.2 10*3/uL (ref 4.0–10.5)
WBC: 5.6 10*3/uL (ref 4.0–10.5)

## 2014-09-16 LAB — GLUCOSE, CAPILLARY
Glucose-Capillary: 85 mg/dL (ref 65–99)
Glucose-Capillary: 254 mg/dL — ABNORMAL HIGH (ref 65–99)

## 2014-09-16 LAB — CREATININE, SERUM
Creatinine, Ser: 1.9 mg/dL — ABNORMAL HIGH (ref 0.61–1.24)
GFR calc Af Amer: 40 mL/min — ABNORMAL LOW (ref >60)
GFR, EST NON AFRICAN AMERICAN: 35 mL/min — AB (ref >60)

## 2014-09-16 LAB — I-STAT TROPONIN, ED: Troponin i, poc: 0.01 ng/mL (ref 0.00–0.08)

## 2014-09-16 LAB — TROPONIN I

## 2014-09-16 MED ORDER — ACETAMINOPHEN 325 MG PO TABS: 650.0000 mg | ORAL_TABLET | Freq: Four times a day (QID) | ORAL | Status: DC | PRN

## 2014-09-16 MED ORDER — FUROSEMIDE 80 MG PO TABS
120.0000 mg | ORAL_TABLET | Freq: Two times a day (BID) | ORAL | Status: DC
  Administered 2014-09-16 – 2014-09-17 (×2): 120 mg via ORAL
  Filled 2014-09-16 (×4): qty 1

## 2014-09-16 MED ORDER — ASPIRIN EC 325 MG PO TBEC
325.0000 mg | DELAYED_RELEASE_TABLET | Freq: Every day | ORAL | Status: DC
  Administered 2014-09-17: 325 mg via ORAL
  Filled 2014-09-16: qty 1

## 2014-09-16 MED ORDER — SODIUM CHLORIDE 0.9 % IV SOLN: 250.0000 mL | INTRAVENOUS | Status: DC | PRN

## 2014-09-16 MED ORDER — AMLODIPINE BESYLATE 10 MG PO TABS
10.0000 mg | ORAL_TABLET | Freq: Every day | ORAL | Status: DC
  Administered 2014-09-16 – 2014-09-17 (×2): 10 mg via ORAL
  Filled 2014-09-16 (×2): qty 1

## 2014-09-16 MED ORDER — ATORVASTATIN CALCIUM 80 MG PO TABS
80.0000 mg | ORAL_TABLET | Freq: Every day | ORAL | Status: DC
  Administered 2014-09-16: 80 mg via ORAL
  Filled 2014-09-16: qty 1

## 2014-09-16 MED ORDER — TAMSULOSIN HCL 0.4 MG PO CAPS
0.4000 mg | ORAL_CAPSULE | Freq: Every day | ORAL | Status: DC
  Administered 2014-09-16 – 2014-09-17 (×2): 0.4 mg via ORAL
  Filled 2014-09-16 (×2): qty 1

## 2014-09-16 MED ORDER — ACETAMINOPHEN 650 MG RE SUPP: 650.0000 mg | Freq: Four times a day (QID) | RECTAL | Status: DC | PRN

## 2014-09-16 MED ORDER — ENOXAPARIN SODIUM 40 MG/0.4ML ~~LOC~~ SOLN
40.0000 mg | SUBCUTANEOUS | Status: DC
  Administered 2014-09-16: 40 mg via SUBCUTANEOUS
  Filled 2014-09-16: qty 0.4

## 2014-09-16 MED ORDER — SODIUM CHLORIDE 0.9 % IJ SOLN: 3.0000 mL | INTRAMUSCULAR | Status: DC | PRN

## 2014-09-16 MED ORDER — CLOPIDOGREL BISULFATE 75 MG PO TABS
75.0000 mg | ORAL_TABLET | Freq: Every day | ORAL | Status: DC
  Administered 2014-09-16 – 2014-09-17 (×2): 75 mg via ORAL
  Filled 2014-09-16 (×2): qty 1

## 2014-09-16 MED ORDER — ONDANSETRON HCL 4 MG/2ML IJ SOLN: 4.0000 mg | Freq: Four times a day (QID) | INTRAMUSCULAR | Status: DC | PRN

## 2014-09-16 MED ORDER — LEVOTHYROXINE SODIUM 100 MCG PO TABS
100.0000 ug | ORAL_TABLET | Freq: Every day | ORAL | Status: DC
  Administered 2014-09-17: 100 ug via ORAL
  Filled 2014-09-16: qty 1

## 2014-09-16 MED ORDER — MORPHINE SULFATE 2 MG/ML IJ SOLN
2.0000 mg | INTRAMUSCULAR | Status: DC | PRN
  Administered 2014-09-17: 2 mg via INTRAVENOUS
  Filled 2014-09-16: qty 1

## 2014-09-16 MED ORDER — SODIUM CHLORIDE 0.9 % IJ SOLN
3.0000 mL | Freq: Two times a day (BID) | INTRAMUSCULAR | Status: DC
  Administered 2014-09-16 – 2014-09-17 (×2): 3 mL via INTRAVENOUS

## 2014-09-16 MED ORDER — SODIUM CHLORIDE 0.9 % IV SOLN
INTRAVENOUS | Status: AC
  Administered 2014-09-16: 17:00:00 via INTRAVENOUS

## 2014-09-16 MED ORDER — ONDANSETRON HCL 4 MG PO TABS: 4.0000 mg | ORAL_TABLET | Freq: Four times a day (QID) | ORAL | Status: DC | PRN

## 2014-09-16 MED ORDER — CARVEDILOL 12.5 MG PO TABS
12.5000 mg | ORAL_TABLET | Freq: Two times a day (BID) | ORAL | Status: DC
  Administered 2014-09-16 – 2014-09-17 (×2): 12.5 mg via ORAL
  Filled 2014-09-16 (×2): qty 1

## 2014-09-16 MED ORDER — INSULIN GLARGINE 100 UNIT/ML ~~LOC~~ SOLN
12.0000 [IU] | Freq: Every day | SUBCUTANEOUS | Status: DC
  Administered 2014-09-16: 12 [IU] via SUBCUTANEOUS
  Filled 2014-09-16 (×2): qty 0.12

## 2014-09-16 MED ORDER — INSULIN ASPART 100 UNIT/ML ~~LOC~~ SOLN
0.0000 [IU] | Freq: Three times a day (TID) | SUBCUTANEOUS | Status: DC
  Administered 2014-09-17: 2 [IU] via SUBCUTANEOUS
  Administered 2014-09-17: 1 [IU] via SUBCUTANEOUS

## 2014-09-16 NOTE — ED Notes (Signed)
Attempted Report x1.   

## 2014-09-16 NOTE — ED Notes (Signed)
MD Lann at the bedside.

## 2014-09-16 NOTE — ED Provider Notes (Signed)
CSN: [id number]     Arrival date & time 09/16/14  1104 History   First MD Initiated Contact with Patient 09/16/14 1124     Chief Complaint  Patient presents with  . Chest Pain      HPI Pt was seen at 1135. Per EMS, pt's wife and pt: c/o gradual onset and persistence of multiple intermittent episodes of left sided chest "pain" that began 5 days ago. Pt describes the pain as "squeezing" with radiation into his left ribs. EMS gave ASA and SL ntg with improvement in symptoms by arrival to the ED. Pt's wife "doesn't think" pt has been SOB. No reported fevers, no N/V, no falls, no abd pain.    Past Medical History  Diagnosis Date  . Hypertension   . Hypercholesterolemia   . Diabetes mellitus without complication   . Glaucoma   . Stroke     a. 01/2007 L corona radiata infarct.  . Cerebrovascular disease     a. 01/2007 MRI/A: L MCA 40-98%, R MCA 11%, LICA 91%.  . CKD (chronic kidney disease), stage III   . Chronic combined systolic and diastolic CHF, NYHA class 2     a. 01/2007 Echo: EF 60-65%;  b. 03/2014 Echo: EF 45-50%, Gr 1 DD, severe LVH.  Marland Kitchen Headache   . Bedridden     transfers to chair   Past Surgical History  Procedure Laterality Date  . Colonoscopy w/ biopsies and polypectomy    . Colon surgery    . Trabeculectomy Left 08/18/2013    Procedure: TRABECULECTOMY WITH TUBE WITH [facility];  Surgeon: [name], MD;  Location: [facility];  Service: Ophthalmology;  Laterality: Left;   Family History  Problem Relation Age of Onset  . Other      Negative for premature CAD.   History  Substance Use Topics  . Smoking status: Never Smoker   . Smokeless tobacco: Never Used  . Alcohol Use: No    Review of Systems ROS: Statement: All systems negative except as marked or noted in the HPI; Constitutional: Negative for fever and chills. ; ; Eyes: Negative for eye pain, redness and discharge. ; ; ENMT: Negative for ear pain, hoarseness, nasal congestion, sinus pressure and sore throat. ; ;  Cardiovascular: +CP. Negative for palpitations, diaphoresis, dyspnea and peripheral edema. ; ; Respiratory: Negative for cough, wheezing and stridor. ; ; Gastrointestinal: Negative for nausea, vomiting, diarrhea, abdominal pain, blood in stool, hematemesis, jaundice and rectal bleeding. . ; ; Genitourinary: Negative for dysuria, flank pain and hematuria. ; ; Musculoskeletal: Negative for back pain and neck pain. Negative for swelling and trauma.; ; Skin: Negative for pruritus, rash, abrasions, blisters, bruising and skin lesion.; ; Neuro: Negative for headache, lightheadedness and neck stiffness. Negative for weakness, altered level of consciousness , altered mental status, extremity weakness, paresthesias, involuntary movement, seizure and syncope.       Allergies  Review of patient's allergies indicates no known allergies.  Home Medications   Prior to Admission medications   Medication Sig Start Date End Date Taking? Authorizing Provider  amLODipine (NORVASC) 10 MG tablet Take 1 tablet (10 mg total) by mouth daily. 04/18/14   Erline Hau, MD  aspirin EC 325 MG tablet Take 325 mg by mouth daily.    Historical Provider, MD  atorvastatin (LIPITOR) 80 MG tablet Take 1 tablet (80 mg total) by mouth daily at 6 PM. 04/18/14   Erline Hau, MD  carvedilol (COREG) 12.5 MG tablet Take 1  tablet (12.5 mg total) by mouth 2 (two) times daily with a meal. 04/18/14   Erline Hau, MD  ciprofloxacin (CIPRO) 500 MG tablet Take 500 mg by mouth 2 (two) times daily.    Historical Provider, MD  clopidogrel (PLAVIX) 75 MG tablet Take 1 tablet (75 mg total) by mouth daily. 04/18/14   Erline Hau, MD  furosemide (LASIX) 40 MG tablet Take 3 tablets (120 mg total) by mouth 2 (two) times daily. 04/18/14   Erline Hau, MD  HYDROcodone-acetaminophen (NORCO/VICODIN) 5-325 MG per tablet Take 1 tablet by mouth every 4 (four) hours as needed for moderate pain.  04/18/14   Erline Hau, MD  insulin glargine (LANTUS) 100 UNIT/ML injection Inject 0.05 mLs (5 Units total) into the skin at bedtime. 04/18/14   Erline Hau, MD  insulin lispro (HUMALOG) 100 UNIT/ML cartridge Inject 100 Units into the skin 3 (three) times daily with meals.    Historical Provider, MD  Multiple Vitamins-Minerals (DECUBI-VITE PO) Take 1 tablet by mouth daily.    Historical Provider, MD  polyethylene glycol (MIRALAX / GLYCOLAX) packet Take 17 g by mouth daily. 04/18/14   Erline Hau, MD  senna (SENOKOT) 8.6 MG tablet Take 1 tablet by mouth 2 (two) times daily.    Historical Provider, MD  tamsulosin (FLOMAX) 0.4 MG CAPS capsule Take 0.4 mg by mouth.    Historical Provider, MD   BP 149/70 mmHg  Pulse 66  Temp(Src) 97.9 F (36.6 C) (Oral)  Resp 18  SpO2 100% Physical Exam  1140: Physical examination:  Nursing notes reviewed; Vital signs and O2 SAT reviewed;  Constitutional: Well developed, Well nourished, Well hydrated, In no acute distress; Head:  Normocephalic, atraumatic; Eyes: EOMI, PERRL, No scleral icterus; ENMT: Mouth and pharynx normal, Mucous membranes moist; Neck: Supple, Full range of motion, No lymphadenopathy; Cardiovascular: Regular rate and rhythm, No gallop; Respiratory: Breath sounds clear & equal bilaterally, No wheezes. Normal respiratory effort/excursion; Chest: Nontender, Movement normal; Abdomen: Soft, Nontender, Nondistended, Normal bowel sounds; Genitourinary: No CVA tenderness; Extremities: Pulses normal, No tenderness, No edema, No calf edema or asymmetry.; Neuro: Awake, alert, vague historian. Minimal speech. Left sided weakness per hx previous CVA.; Skin: Color normal, Warm, Dry.   ED Course  Procedures      EKG Interpretation   Date/Time:  Friday September 16 2014 11:11:55 EDT Ventricular Rate:  73 PR Interval:  195 QRS Duration: 94 QT Interval:  401 QTC Calculation: 442 R Axis:   56 Text Interpretation:  Sinus  rhythm Probable left ventricular hypertrophy  Nonspecific T abnormalities, lateral leads Baseline wander When compared  with ECG of 04/09/2014 Nonspecific T wave abnormality Lateral leads is more  pronounced Confirmed by [facility]  MD, Nunzio Cory [phone]) on 09/16/2014  11:44:34 AM      MDM  MDM Reviewed: previous chart, nursing note and vitals Reviewed previous: labs and ECG Interpretation: labs, ECG and x-ray     Results for orders placed or performed during the hospital encounter of 09/16/14  CBC  Result Value Ref Range   WBC 6.2 4.0 - 10.5 K/uL   RBC 3.56 (L) 4.22 - 5.81 MIL/uL   Hemoglobin 11.1 (L) 13.0 - 17.0 g/dL   HCT 32.2 (L) 39.0 - 52.0 %   MCV 90.4 78.0 - 100.0 fL   MCH 31.2 26.0 - 34.0 pg   MCHC 34.5 30.0 - 36.0 g/dL   RDW 13.5 11.5 - 15.5 %   Platelets  178 150 - 400 K/uL  Basic metabolic panel  Result Value Ref Range   Sodium 143 135 - 145 mmol/L   Potassium 3.8 3.5 - 5.1 mmol/L   Chloride 108 101 - 111 mmol/L   CO2 27 22 - 32 mmol/L   Glucose, Bld 122 (H) 65 - 99 mg/dL   BUN 89 (H) 6 - 20 mg/dL   Creatinine, Ser 1.87 (H) 0.61 - 1.24 mg/dL   Calcium 9.1 8.9 - 10.3 mg/dL   GFR calc non Af Amer 35 (L) >60 mL/min   GFR calc Af Amer 41 (L) >60 mL/min   Anion gap 8 5 - 15  I-stat troponin, ED  (not at [facility], [facility])  Result Value Ref Range   Troponin i, poc 0.01 0.00 - 0.08 ng/mL   Comment 3           Dg Chest Port 1 View 09/16/2014   CLINICAL DATA:  Intermittent left chest pain.  EXAM: PORTABLE CHEST - 1 VIEW  COMPARISON:  04/09/2014  FINDINGS: Lungs are clear. Stable linear density in the left perihilar region. Heart size is within normal limits. No focal airspace disease or pulmonary edema. No acute bone abnormality.  IMPRESSION: No active disease.   Electronically Signed   By: Markus Daft M.D.   On: 09/16/2014 11:42    1245:  Denies CP while in the ED. Multiple risk factors for ACS; will observation admit. Dx and testing d/w pt and family.  Questions answered.  Verb  understanding, agreeable to admit.  T/C to [facility] Dr. Darrick Meigs, case discussed, including:  HPI, pertinent PM/SHx, VS/PE, dx testing, ED course and treatment:  Agreeable to admit, requests to write temporary orders, obtain observation tele bed to team MCAdmits.    Francine Graven, DO 09/18/14 1419

## 2014-09-16 NOTE — ED Notes (Signed)
Per EMS, Patient is coming from home. Patient reports having intermittent squeezing pain in his left chest that radiates down to his lower left ribs for the past five days. Patient denies any nausea, vomiting, dizziness, or radiating pain to other parts of the body. Patient had history of three strokes with left sided deficit noted due to the last stroke in January. Pt was giving 324 mg of Aspirin and 1 Nitro with a significant decrease of BP. Patient denies any significant relief in chest pain with medication. Patient was given 324 mg of Aspirin by wife before EMS arrived unknown to the EMS at the time. Vitals per EMS: 97% on RA, CBG 146, 104/68, 70 HR. Patient denies any pain at this time.

## 2014-09-16 NOTE — H&P (Signed)
PCP:   Philis Fendt, MD   Chief Complaint:  Chest pain  HPI:  69 year old male who  has a past medical history of Hypertension; Hypercholesterolemia; Diabetes mellitus without complication; Glaucoma; Stroke; Cerebrovascular disease; CKD (chronic kidney disease), stage III; Chronic combined systolic and diastolic CHF, NYHA class 2; Headache; and Bedridden. Today patient was brought to the ED for left-sided chest pain radiating down to the left lower ribs for past 5 days. Patient has a history of stroke, and chronically bedbound. This morning he took 2 aspirin and was given pain medication in the ED. Patient denies any pain at this time. He denies shortness of breath, no cough. No nausea vomiting or diarrhea. He denies fever, no dysuria.  Allergies:  No Known Allergies    Past Medical History  Diagnosis Date  . Hypertension   . Hypercholesterolemia   . Diabetes mellitus without complication   . Glaucoma   . Stroke     a. 01/2007 L corona radiata infarct.  . Cerebrovascular disease     a. 01/2007 MRI/A: L MCA 58-09%, R MCA 98%, LICA 33%.  . CKD (chronic kidney disease), stage III   . Chronic combined systolic and diastolic CHF, NYHA class 2     a. 01/2007 Echo: EF 60-65%;  b. 03/2014 Echo: EF 45-50%, Gr 1 DD, severe LVH.  Marland Kitchen Headache   . Bedridden     transfers to chair    Past Surgical History  Procedure Laterality Date  . Colonoscopy w/ biopsies and polypectomy    . Colon surgery    . Trabeculectomy Left 08/18/2013    Procedure: TRABECULECTOMY WITH TUBE WITH [facility];  Surgeon: [name], MD;  Location: [place];  Service: Ophthalmology;  Laterality: Left;    Prior to Admission medications   Medication Sig Start Date End Date Taking? Authorizing Provider  amLODipine (NORVASC) 10 MG tablet Take 1 tablet (10 mg total) by mouth daily. 04/18/14  Yes Erline Hau, MD  aspirin EC 325 MG tablet Take 325 mg by mouth daily.   Yes Historical Provider, MD  atorvastatin  (LIPITOR) 80 MG tablet Take 1 tablet (80 mg total) by mouth daily at 6 PM. 04/18/14  Yes Estela Leonie Green, MD  carvedilol (COREG) 12.5 MG tablet Take 1 tablet (12.5 mg total) by mouth 2 (two) times daily with a meal. 04/18/14  Yes Erline Hau, MD  clopidogrel (PLAVIX) 75 MG tablet Take 1 tablet (75 mg total) by mouth daily. 04/18/14  Yes Estela Leonie Green, MD  furosemide (LASIX) 40 MG tablet Take 3 tablets (120 mg total) by mouth 2 (two) times daily. 04/18/14  Yes Erline Hau, MD  insulin glargine (LANTUS) 100 UNIT/ML injection Inject 0.05 mLs (5 Units total) into the skin at bedtime. Patient taking differently: Inject 12 Units into the skin at bedtime.  04/18/14  Yes Erline Hau, MD  insulin lispro (HUMALOG) 100 UNIT/ML cartridge Inject 5 Units into the skin 3 (three) times daily with meals. Use PRN for blood sugar above 200   Yes Historical Provider, MD  Multiple Vitamins-Minerals (DECUBI-VITE PO) Take 1 tablet by mouth daily.   Yes Historical Provider, MD  OVER THE COUNTER MEDICATION Take 1 scoop by mouth 2 (two) times daily. Protein powder mixed with food/drink   Yes Historical Provider, MD  senna (SENOKOT) 8.6 MG tablet Take 1 tablet by mouth 2 (two) times daily.   Yes Historical Provider, MD  tamsulosin (FLOMAX) 0.4 MG  CAPS capsule Take 0.4 mg by mouth daily.    Yes Historical Provider, MD  ciprofloxacin (CIPRO) 500 MG tablet Take 500 mg by mouth 2 (two) times daily.    Historical Provider, MD  HYDROcodone-acetaminophen (NORCO/VICODIN) 5-325 MG per tablet Take 1 tablet by mouth every 4 (four) hours as needed for moderate pain. 04/18/14   Erline Hau, MD  polyethylene glycol (MIRALAX / Floria Raveling) packet Take 17 g by mouth daily. 04/18/14   Erline Hau, MD    Social History:  reports that he has never smoked. He has never used smokeless tobacco. He reports that he does not drink alcohol or use illicit drugs.  Family  History  Problem Relation Age of Onset  . Other      Negative for premature CAD.    There were no vitals filed for this visit.  All the positives are listed in BOLD  Review of Systems:  HEENT: Headache, blurred vision, runny nose, sore throat Neck: Hypothyroidism, hyperthyroidism,,lymphadenopathy Chest : Shortness of breath, history of COPD, Asthma Heart : Chest pain, history of coronary arterey disease GI:  Nausea, vomiting, diarrhea, constipation, GERD GU: Dysuria, urgency, frequency of urination, hematuria Neuro: Stroke, seizures, syncope Psych: Depression, anxiety, hallucinations   Physical Exam: Blood pressure 147/76, pulse 66, temperature 97.9 F (36.6 C), temperature source Oral, resp. rate 9, SpO2 100 %. Constitutional:   Patient is a well-developed and well-nourished *male* in no acute distress and cooperative with exam. Head: Normocephalic and atraumatic Mouth: Mucus membranes moist Eyes: PERRL, EOMI, conjunctivae normal Neck: Supple, No Thyromegaly Cardiovascular: RRR, S1 normal, S2 normal Pulmonary/Chest: CTAB, no wheezes, rales, or rhonchi Abdominal: Soft. Non-tender, non-distended, bowel sounds are normal, no masses, organomegaly, or guarding present.  Neurological: A&O x3, left hemiparesis  Extremities : No Cyanosis, Clubbing or Edema  Labs on Admission:  Basic Metabolic Panel:  Recent Labs Lab 09/16/14 1151  NA 143  K 3.8  CL 108  CO2 27  GLUCOSE 122*  BUN 89*  CREATININE 1.87*  CALCIUM 9.1   CBC:  Recent Labs Lab 09/16/14 1151  WBC 6.2  HGB 11.1*  HCT 32.2*  MCV 90.4  PLT 178   Cardiac Enzymes: No results for input(s): CKTOTAL, CKMB, CKMBINDEX, TROPONINI in the last 168 hours.  BNP (last 3 results)  Recent Labs  04/09/14 1523  BNP 708.3*    Radiological Exams on Admission: Dg Chest Port 1 View  09/16/2014   CLINICAL DATA:  Intermittent left chest pain.  EXAM: PORTABLE CHEST - 1 VIEW  COMPARISON:  04/09/2014  FINDINGS: Lungs  are clear. Stable linear density in the left perihilar region. Heart size is within normal limits. No focal airspace disease or pulmonary edema. No acute bone abnormality.  IMPRESSION: No active disease.   Electronically Signed   By: Markus Daft M.D.   On: 09/16/2014 11:42    EKG: Independently reviewed. Normal sinus rhythm, nonspecific T-wave abnormality in lateral leads   Assessment/Plan Active Problems:   Hypertension   Stroke   Essential hypertension   Type 2 diabetes mellitus with other circulatory complications   HLD (hyperlipidemia)   Chest pain  Chest pain Will admit the patient under telemetry, obtain serial cardiac enzymes. Will give morphine when necessary for pain Continue aspirin and Plavix  History of CVA Patient on dual antibiotic therapy, continue Plavix and aspirin.  Diabetes mellitus Initiate sliding scale insulin with NovoLog   Code status: full code  Family discussion: Admission, patients condition and plan of  care including tests being ordered have been discussed with the patient and his wife at bedside** who indicate understanding and agree with the plan and Code Status.   Time Spent on Admission: 60 min  [facility] Pager: [phone] 09/16/2014, 1:45 PM  If 7PM-7AM, please contact night-coverage  [email or web address]  Password [email or web address]

## 2014-09-17 ENCOUNTER — Inpatient Hospital Stay (HOSPITAL_COMMUNITY): Payer: Commercial Managed Care - HMO

## 2014-09-17 DIAGNOSIS — R0789 Other chest pain: Principal | ICD-10-CM

## 2014-09-17 DIAGNOSIS — R079 Chest pain, unspecified: Secondary | ICD-10-CM | POA: Insufficient documentation

## 2014-09-17 LAB — CBC
HEMATOCRIT: 30.6 % — AB (ref 39.0–52.0)
Hemoglobin: 10.5 g/dL — ABNORMAL LOW (ref 13.0–17.0)
MCH: 31.9 pg (ref 26.0–34.0)
MCHC: 34.3 g/dL (ref 30.0–36.0)
MCV: 93 fL (ref 78.0–100.0)
Platelets: 184 10*3/uL (ref 150–400)
RBC: 3.29 MIL/uL — ABNORMAL LOW (ref 4.22–5.81)
RDW: 13.9 % (ref 11.5–15.5)
WBC: 5.9 10*3/uL (ref 4.0–10.5)

## 2014-09-17 LAB — COMPREHENSIVE METABOLIC PANEL
ALBUMIN: 2.8 g/dL — AB (ref 3.5–5.0)
ALT: 50 U/L (ref 17–63)
ANION GAP: 7 (ref 5–15)
AST: 50 U/L — ABNORMAL HIGH (ref 15–41)
Alkaline Phosphatase: 115 U/L (ref 38–126)
BILIRUBIN TOTAL: 0.3 mg/dL (ref 0.3–1.2)
BUN: 80 mg/dL — AB (ref 6–20)
CALCIUM: 8.9 mg/dL (ref 8.9–10.3)
CO2: 28 mmol/L (ref 22–32)
Chloride: 107 mmol/L (ref 101–111)
Creatinine, Ser: 1.93 mg/dL — ABNORMAL HIGH (ref 0.61–1.24)
GFR calc Af Amer: 39 mL/min — ABNORMAL LOW (ref >60)
GFR calc non Af Amer: 34 mL/min — ABNORMAL LOW (ref >60)
Glucose, Bld: 129 mg/dL — ABNORMAL HIGH (ref 65–99)
Potassium: 3.4 mmol/L — ABNORMAL LOW (ref 3.5–5.1)
Sodium: 142 mmol/L (ref 135–145)
TOTAL PROTEIN: 6.7 g/dL (ref 6.5–8.1)

## 2014-09-17 LAB — GLUCOSE, CAPILLARY
GLUCOSE-CAPILLARY: 138 mg/dL — AB (ref 65–99)
Glucose-Capillary: 157 mg/dL — ABNORMAL HIGH (ref 65–99)

## 2014-09-17 LAB — HEMOGLOBIN A1C
Hgb A1c MFr Bld: 7.9 % — ABNORMAL HIGH (ref 4.8–5.6)
MEAN PLASMA GLUCOSE: 180 mg/dL

## 2014-09-17 LAB — TROPONIN I
Troponin I: <0.03 ng/mL (ref <0.031)
Troponin I: <0.03 ng/mL (ref <0.031)

## 2014-09-17 LAB — D-DIMER, QUANTITATIVE (NOT AT ARMC): D DIMER QUANT: 0.77 ug{FEU}/mL — AB (ref 0.00–0.48)

## 2014-09-17 MED ORDER — TECHNETIUM TO 99M ALBUMIN AGGREGATED
3.0000 | Freq: Once | INTRAVENOUS | Status: AC | PRN
  Administered 2014-09-17: 3 via INTRAVENOUS

## 2014-09-17 MED ORDER — ALUM & MAG HYDROXIDE-SIMETH 200-200-20 MG/5ML PO SUSP
30.0000 mL | Freq: Once | ORAL | Status: AC
  Administered 2014-09-17: 30 mL via ORAL
  Filled 2014-09-17: qty 30

## 2014-09-17 MED ORDER — OMEPRAZOLE 40 MG PO CPDR: 40.0000 mg | DELAYED_RELEASE_CAPSULE | Freq: Every day | ORAL | Status: DC

## 2014-09-17 MED ORDER — HYDROCODONE-ACETAMINOPHEN 5-325 MG PO TABS: 1.0000 | ORAL_TABLET | Freq: Four times a day (QID) | ORAL | Status: DC | PRN

## 2014-09-17 NOTE — Discharge Summary (Signed)
Physician Discharge Summary  [name] YWV:[id number] DOB: 07-Dec-1945 DOA: 09/16/2014  PCP: Philis Fendt, MD  Admit date: 09/16/2014 Discharge date: 09/17/2014   Discharge Diagnoses:  Principle problem: Noncardiac CP. Likely musculoskeletal Active Problems:   Hypertension   Stroke   Essential hypertension   Type 2 diabetes mellitus with other circulatory complications   HLD (hyperlipidemia)  Discharge Condition: stable  Diet recommendation: heart healthy/carbohydrate modified  Filed Weights   09/16/14 1608 09/17/14 0457  Weight: 58.287 kg (128 lb 8 oz) 57.516 kg (126 lb 12.8 oz)   History of present illness:  69 year old male who  has a past medical history of Hypertension; Hypercholesterolemia; Diabetes mellitus without complication; Glaucoma; Stroke; Cerebrovascular disease; CKD (chronic kidney disease), stage III; Chronic combined systolic and diastolic CHF, NYHA class 2; Headache; and Bedridden. Today patient was brought to the ED for left-sided chest pain radiating down to the left lower ribs for past 5 days. Patient has a history of stroke, and chronically bedbound. This morning he took 2 aspirin and was given pain medication in the ED. Patient denies any pain at this time. He denies shortness of breath, no cough. No nausea vomiting or diarrhea. He denies fever, no dysuria  Hospital Course:  Observed on telemetry. MI ruled out. VQ low probability. Pain mainly positional. Suspect musculoskeletal. Stable for discharge  Procedures:  none  Consultations:  none  Discharge Exam: Filed Vitals:   09/17/14 0457  BP: 149/67  Pulse: 74  Temp: 98.2 F (36.8 C)  Resp: 20    General: a and o Cardiovascular: RRR Respiratory: CTA Neuro: chronic left hemiparesis abd s, nt, nd  Discharge Instructions   Discharge Instructions    Diet - low sodium heart healthy    Complete by:  As directed      Diet Carb Modified    Complete by:  As directed      Walk with  assistance    Complete by:  As directed           Current Discharge Medication List    START taking these medications   Details  omeprazole (PRILOSEC) 40 MG capsule Take 1 capsule (40 mg total) by mouth daily. Qty: 30 capsule, Refills: 1      CONTINUE these medications which have NOT CHANGED   Details  amLODipine (NORVASC) 10 MG tablet Take 1 tablet (10 mg total) by mouth daily.    aspirin EC 325 MG tablet Take 325 mg by mouth daily.    atorvastatin (LIPITOR) 80 MG tablet Take 1 tablet (80 mg total) by mouth daily at 6 PM.    carvedilol (COREG) 12.5 MG tablet Take 1 tablet (12.5 mg total) by mouth 2 (two) times daily with a meal.    clopidogrel (PLAVIX) 75 MG tablet Take 1 tablet (75 mg total) by mouth daily.    furosemide (LASIX) 40 MG tablet Take 3 tablets (120 mg total) by mouth 2 (two) times daily. Qty: 30 tablet    insulin glargine (LANTUS) 100 UNIT/ML injection Inject 0.05 mLs (5 Units total) into the skin at bedtime. Qty: 10 mL, Refills: 11    insulin lispro (HUMALOG) 100 UNIT/ML cartridge Inject 5 Units into the skin 3 (three) times daily with meals. Use PRN for blood sugar above 200    levothyroxine (SYNTHROID, LEVOTHROID) 100 MCG tablet Take 100 mcg by mouth daily.    Multiple Vitamins-Minerals (DECUBI-VITE PO) Take 1 tablet by mouth daily.    OVER THE COUNTER MEDICATION Take 1 scoop by mouth  2 (two) times daily. Protein powder mixed with food/drink    polyethylene glycol (MIRALAX / GLYCOLAX) packet Take 17 g by mouth daily. Qty: 14 each, Refills: 0    senna (SENOKOT) 8.6 MG tablet Take 1 tablet by mouth 2 (two) times daily.    tamsulosin (FLOMAX) 0.4 MG CAPS capsule Take 0.4 mg by mouth daily.     HYDROcodone-acetaminophen (NORCO/VICODIN) 5-325 MG per tablet Take 1 tablet by mouth every 4 (four) hours as needed for moderate pain. Qty: 30 tablet, Refills: 0       No Known Allergies Follow-up Information    Follow up with Philis Fendt, MD.    Specialty:  Internal Medicine   Why:  As needed   Contact information:   [place] [address] [phone]        The results of significant diagnostics from this hospitalization (including imaging, microbiology, ancillary and laboratory) are listed below for reference.    Significant Diagnostic Studies: Nm Pulmonary Perfusion  10/03/2014   CLINICAL DATA:  Chest pain.  EXAM: NUCLEAR MEDICINE PERFUSION LUNG SCAN  TECHNIQUE: Perfusion images were obtained in multiple projections after intravenous injection of radiopharmaceutical.  RADIOPHARMACEUTICALS:  3.0 mCi Tc-32m MAA IV  COMPARISON:  Chest radiograph from 09/16/2014.  FINDINGS: On the chest x-ray from yesterday there are no focal pulmonary opacities.  On the perfusion scan there are a few small basilar segmental perfusion defects. No medium or large size segmental perfusion defects identified.  IMPRESSION: 1. No medium or large size segmental perfusion defects identified to suggest acute pulmonary embolus. Low probability.   Electronically Signed   By: Kerby Moors M.D.   On: 10-03-2014 13:26   Dg Chest Port 1 View  09/16/2014   CLINICAL DATA:  Intermittent left chest pain.  EXAM: PORTABLE CHEST - 1 VIEW  COMPARISON:  04/09/2014  FINDINGS: Lungs are clear. Stable linear density in the left perihilar region. Heart size is within normal limits. No focal airspace disease or pulmonary edema. No acute bone abnormality.  IMPRESSION: No active disease.   Electronically Signed   By: Markus Daft M.D.   On: 09/16/2014 11:42    Microbiology: No results found for this or any previous visit (from the past 240 hour(s)).   Labs: Basic Metabolic Panel:  Recent Labs Lab 09/16/14 1151 09/16/14 1715 03-Oct-2014 0545  NA 143  --  142  K 3.8  --  3.4*  CL 108  --  107  CO2 27  --  28  GLUCOSE 122*  --  129*  BUN 89*  --  80*  CREATININE 1.87* 1.90* 1.93*  CALCIUM 9.1  --  8.9   Liver Function Tests:  Recent Labs Lab  10-03-14 0545  AST 50*  ALT 50  ALKPHOS 115  BILITOT 0.3  PROT 6.7  ALBUMIN 2.8*   No results for input(s): LIPASE, AMYLASE in the last 168 hours. No results for input(s): AMMONIA in the last 168 hours. CBC:  Recent Labs Lab 09/16/14 1151 09/16/14 1715 03-Oct-2014 0545  WBC 6.2 5.6 5.9  HGB 11.1* 11.2* 10.5*  HCT 32.2* 32.2* 30.6*  MCV 90.4 90.7 93.0  PLT 178 174 184   Cardiac Enzymes:  Recent Labs Lab 09/16/14 1715 09/16/14 2255 October 03, 2014 0545  TROPONINI <0.03 <0.03 <0.03   BNP: BNP (last 3 results)  Recent Labs  04/09/14 1523  BNP 708.3*    ProBNP (last 3 results) No results for input(s): PROBNP in the last 8760 hours.  CBG:  Recent Labs Lab 09/16/14 1647 09/16/14 2049 09/17/14 0717 09/17/14 1126  GLUCAP 85 254* 138* 157*       Signed:  [facility] 09/17/2014, 1:51 PM

## 2014-09-17 NOTE — Progress Notes (Signed)
Discharge instructions reviewed with pt and family. Pt understands teaching. VSS. Will discharge home with daughter.

## 2014-09-17 NOTE — Progress Notes (Signed)
D-dimer elevated at 0.77 MD notified

## 2014-09-17 NOTE — Progress Notes (Signed)
Pt c/o of left sided chest pain, iv morphine 2mg  given at 0521 as ordered, EKG done, NP Kathline Magic (on call) paged and notified, reviewed EKG and found no new changes, pt verbalises pain is gradually going down, Maalox  was ordered just in case of indigestion, pt had about 3 cups of cold water and felt better, pt reassured, will however continue to monitor. [place], [place] Efe

## 2014-10-18 ENCOUNTER — Telehealth: Payer: Self-pay | Admitting: *Deleted

## 2014-10-18 NOTE — Telephone Encounter (Signed)
LMTCB on daughter's phone for scheduling of Left 1st stage BVT per Dr. Lianne Moris office notes on 05-27-14

## 2014-10-26 ENCOUNTER — Other Ambulatory Visit: Payer: Self-pay | Admitting: Ophthalmology

## 2014-10-26 MED ORDER — TETRACAINE HCL 0.5 % OP SOLN: 1.0000 [drp] | OPHTHALMIC | Status: AC

## 2014-10-26 NOTE — H&P (Signed)
History & Physical:   DATE:   10-20-14  [name], [name]      [id number]       HISTORY OF PRESENT ILLNESS: Referred by Dr. Claudean Kinds     Chief Eye Complaints Glaucoma  [name]: Va seems to be worse. [name] has been getting Pred forte since being home from rehab. [name] has not been using lumigan. [name] has seen Dr. 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 [name] daughter states [name] was suppose to get referral to a neuro ophthalmologist but was never referred to one. [name] daughter was told that [name] had peripheral damage and problems focusing. BS 168mg % this am.   HPI: EYES: Reports symptoms of  dim vision     LOCATION:   BOTH EYES        QUALITY/COURSE:   Reports condition is worsening.        INTENSITY/SEVERITY:    Reports measurement ( or degree) as severe .      DURATION:   Reports the general length of symptoms to be years.      ACTIVE PROBLEMS: Total mature senile cataract   ICD10: H25.89  ICD9: 366.17  Onset: 09/20/2014 16:25  Initial Date:     Serous choroidal detachment, left eye   ICD10: H31.422  ICD9:   NO VIEW PROBABLY RESOLVED   Primary open-angle glaucoma, severe stage   ICD10: H40.11X3  ICD9:   Onset: 12/29/2013 12:01  stable OS Blindness, one eye, low vision other eye   ICD10: H54.10  ICD9: 369.10  Onset: 02/01/2014 13:23  Initial Date:    Hypertension   ICD10:   ICD9: 401.9  Onset: 04/26/2013 09:50  Initial Date:    Diabetes - Type 2 - unspecified   ICD10: E11.9  ICD9: 250.00  Onset: 04/26/2013 09:50  Initial Date:    Posterior vitreous detachment   ICD10: H43.819  ICD9: 379.21  Onset: 04/26/2013 12:09  Initial Date: 09/20/2013  09:32  SURGERIES: Stroke 2008, Jan 2016  [facility]   OS 08/18/2013 Pick List - Surgeries SLT 360 OS  06/17/2013 14:25  MEDICATIONS: Lantus: Strength-   SIG-  Dose-  Freq-   12 units  Amlodipine (Norvasc):   10 mg tablet  SIG-  1 each   once a day    Plavix (Clopidogrel):  75 mg tablet  SIG-  1 tab(s)   once a day   Flomax (Tamsulosin):   0.4 mg capsule  SIG-  2 cap(s)   once a day   Coreg (Carvedilol):   12.5 mg tablet  SIG-  1 tab(s)   2 times a day   Senna (Senokot, Ex-Lax):    8.6 mg tablet    SIG-    1 or 2     once a day (at bedtime) as needed for constipation               Lipitor:    80 mg tablet   SIG-  1 each     once a day                  Synthroid (Levothyroxine): 25 mcg (0.025 mg) tablet  SIG-  1 each   once a day   Norco  (APAP/Hydrocodone):   325 mg-5 mg tablet SIG-  1 tab(s)   every 4 hours    Furosemide (  Lasix):   40 mg tablet  SIG-  3 tab(s)    in the AM    Multivitamin: Strength-  SIG-  Dose-  Freq-    Aspirin:  Strength-    SIG-  1 each   once a day   Lumigan: 0.01% solution SIG-  1 DROP IN OD QHS    Humalog (Insulin Lispro):  100 units/mL (injection)  SIG-   as directed    as directed   sliding scale  Pred Forte: acetate 1% suspension SIG-  1 gtt in each affected eye 4 times a day for 5 days   Atropine Sulfate, Ophthalmic: 1% solution SIG-  1 DROP IN OS BID   Ciloxan (Ciprofloxacin) Solution: 0.3% solution SIG-  1 drop in OS BID   Zymaxid: 0.5% solution SIG-  1 drop in OS BID   Ciloxan (Ciprofloxacin) Solution: 0.3% solution SIG-  1 drop in OS BID   Zymaxid: 0.5% solution SIG-  1 drop in OS BID   Atropine Sulfate, Ophthalmic: 1% solution SIG-  1 DROP IN OS BID   Pred Forte: acetate 1% suspension SIG-  1 gtt in each affected eye 4 times a day for 5 days   Lumigan: 0.01% solution SIG-  1 DROP IN OD QHS  ROS:   GEN- Constitutional: HENT: GEN - Endocrine: Reports symptoms of diabetes.    LUNGS/Respiratory:  HEART/Cardiovascular: Reports symptoms of hypertension.    ABD/Gastrointestinal:  Musculoskeletal (BJE): NEURO/Neurological: stroke left side  PSYCH/Psychiatric:    Is the pt oriented to time, place, person? yes Mood_ normal     TOBACCO: Never smoker   ICD10: Z87.898 ICD9: V13.89  Onset: 04/26/2013 09:52 Initial Date:   Tobacco use:     Tobacco cessation:          Smoker Status:   Never smoker   ICD10: Z87.898 ICD9: V13.89 Onset: 04/26/2013 09:52 Initial Date:  SOCIAL HISTORY: [employer] [employer] - Social History  Retired [employer]  FAMILY HISTORY: Positive family history for  -   Glaucoma HTN Family History - 1st Degree Relatives:  Mother dead.  ALLERGIES: Drug Allergies.  No Known.  PHYSICAL EXAMINATION: VS: BMI: 24.2.  BP: 136/65.  H: 65.00 in.  P: 90 /min.  W: 145lbs 0oz.    Va06/28/2016 16:03  OD:[place] [facility]/NLP OS [place] 20/CF PH NI  EYEGLASSES: several yrs old (left at home) OD -0.25 +0.25 x005                  OS +4.00 +2.00 x111  ADD:   Auto Refraction: 08/25/2013 10:12  OD: defer OS: +2.50 +2.00 x127   MR 10/26/2013 10:07  OD: defer OS: +2.25 +1.75 x137 20/50  after liquigel ADD  K's OD: 41.75 axis 104       45.00  axis 014 OS: 42.25   axis 015      44.75  axis 105   VF: OD: Loss of vision in all four quadrants OS: decreased vision in all four quadrants  Motility:  orthophoria and full  PUPILS: +2 afferent pupillary defect OD, dilated 41mm non reactive OS  EYELIDS & OCULAR ADNEXA normal  SLE: Conjunctiva:pinguecula each eye quiet, -leak, bleb more difussed negative leak    filter 180 degrees OS   Cornea:  arcus each eye, 3-4+ central staining, cl in good position OS   Anterior Chamber: OS shallow w/iris touch 1CT over lens   tube in good position  Iris: brown  Lens: +3 nuclear sclerosis each eye 3+ psc OS, MATURE WHITE CATARACT OS  Vitreous: posterior vitreous detachment    CCT:  Ta   in mmHg    OD    OS 13 Time06/28/2016 16:20   Gonio: angle looks closed several areas, partial tube occlusion   Dilation:  phenylephrine 2.5% trop 1% cyclop 1%   Fundus:  optic nerve   OD: Rim loss                                                   WU:JWJX temporal  w/small nerve    Macula       OD: Dull light reflex  OS: Dull light reflex   Vessels:narrow  Periphery: drusen OU, corital infusion Os    Exam: GENERAL: Appearance: HEAD, EARS, NOSE AND THROAT: Ears-Nose (external) Inspection: Externally, nose and ears are normal in appearance and without scars, lesions, or nodules.      Hearing assessment shows no problems with normal conversation.      LUNGS and RESPIRATORY: Lung auscultation elicits no wheezing, rhonci, rales or rubs and with equal breath sounds.    Respiratory effort described as breathing is unlabored and chest movement is symmetrical.    HEART (Cardiovascular): Heart auscultation discovers regular rate and rhythm; no murmur, gallop or rub. Normal heart sounds.    ABDOMEN (Gastrointestinal): Mass/Tenderness Exam: Neither are present.     MUSCULOSKELETAL (BJE): Inspection-Palpation: No major bone, joint, tendon, or muscle changes.      NEUROLOGICAL: Alert and oriented. No major deficits of coordination or sensation.      PSYCHIATRIC: Insight and judgment appear  both to be intact and appropriate.    Mood and affect are described as normal mood and full affect.    SKIN: Skin Inspection: No rashes or lesions  ADMITTING DIAGNOSIS: Total mature senile cataract   ICD10: H25.89  ICD9: 366.17  Complex requires Trypan Blue      Serous choroidal detachment, left eye   ICD10: H31.422  ICD9:   NO VIEW PROBABLY RESOLVED   Primary open-angle glaucoma, severe stage   ICD10: H40.11X3  ICD9:   Onset: 12/29/2013 12:01  stable OS Blindness, one eye, low vision other eye   ICD10: H54.10  ICD9: 369.10  Onset: 02/01/2014 13:23  Initial Date:    Hypertension   ICD10:   ICD9: 401.9  Onset: 04/26/2013 09:50  Initial Date:    Diabetes - Type 2 - unspecified   ICD10: E11.9  ICD9: 250.00  Onset: 04/26/2013 09:50  Initial Date:    Posterior vitreous detachment   ICD10: H43.819  ICD9: 379.21  Onset: 04/26/2013 12:09  Initial Date: 09/20/2013   09:32  SURGICAL TREATMENT PLAN: ciloxan (ciprofloxacin) (tan top) twice a day in the left eye the day before surgery             Zymaxxid twice a day in the left eye the day before surgery  review head ct scan from [facility]   phaco emulsion cataract extraction   intraocular lens implant  OS  Risk and benefits of surgery have been reviewed with the [name] and the [name] agrees to proceed with the surgical procedure.    needs clearance from Dr Jeanie Cooks  continue atropine twice a day  start using pred forte once a day  LUMIGAN 1 DROP IN RIGHT  EYE AT BEDTIME  dilate today      ___________________________ [name], Brooke Bonito Starter - Inactive Problems: Cerebrovascular accident (CVA or stroke]   ICD10: I63.9  ICD9: 436  Onset: 04/11/2014  17:24  Initial Date:   Resolved: 08/16/2014  17:24   Stroke (Right Side) High Cholesterol

## 2014-11-02 ENCOUNTER — Encounter (HOSPITAL_COMMUNITY): Admission: RE | Payer: Self-pay | Source: Ambulatory Visit

## 2014-11-02 ENCOUNTER — Ambulatory Visit (HOSPITAL_COMMUNITY)
Admission: RE | Admit: 2014-11-02 | Payer: Commercial Managed Care - HMO | Source: Ambulatory Visit | Admitting: Ophthalmology

## 2014-11-02 SURGERY — PHACOEMULSIFICATION, CATARACT, WITH IOL INSERTION
Anesthesia: Monitor Anesthesia Care | Laterality: Left

## 2014-11-16 ENCOUNTER — Ambulatory Visit (HOSPITAL_COMMUNITY): Admission: RE | Admit: 2014-11-16 | Payer: Self-pay | Source: Ambulatory Visit | Admitting: Ophthalmology

## 2014-11-16 ENCOUNTER — Encounter (HOSPITAL_COMMUNITY): Admission: RE | Payer: Self-pay | Source: Ambulatory Visit

## 2014-11-16 SURGERY — PHACOEMULSIFICATION, CATARACT, WITH IOL INSERTION
Anesthesia: Monitor Anesthesia Care | Laterality: Left

## 2014-11-24 ENCOUNTER — Other Ambulatory Visit: Payer: Self-pay | Admitting: Ophthalmology

## 2014-11-29 ENCOUNTER — Other Ambulatory Visit: Payer: Self-pay | Admitting: Ophthalmology

## 2014-11-30 ENCOUNTER — Other Ambulatory Visit: Payer: Self-pay | Admitting: Ophthalmology

## 2014-11-30 NOTE — H&P (Signed)
History & Physical:   DATE:   11-22-14  NAME:  [name], [name]      [id number]       HISTORY OF PRESENT ILLNESS: Referred by Dr. Claudean Kinds     Chief Eye Complaints Glaucoma  patient: Va seems to be worse. Patient fell and hit head a few weeks ago. Patient had an abrasion on the left cheek bone, along with a broken blood vessel in OS.  BS 168mg % this am.   HPI: EYES: Reports symptoms of dim vision only seeing eye.     LOCATION:   BOTH EYES        QUALITY/COURSE:   Reports condition is worsening.        INTENSITY/SEVERITY:    Reports measurement ( or degree) as severe .      DURATION:   Reports the general length of symptoms to be years.               ACTIVE PROBLEMS: Total mature senile cataract   ICD10: H25.89  ICD9: 366.17  Onset: 09/20/2014 16:25  Initial Date:      Primary open-angle glaucoma, severe stage   ICD10: H40.11X3  ICD9:   Onset: 12/29/2013 12:01  stable OS Blindness, one eye, low vision other eye   ICD10: H54.10  ICD9: 369.10  Onset: 02/01/2014 13:23  Initial Date:    Hypertension   ICD10:   ICD9: 401.9  Onset: 04/26/2013 09:50  Initial Date:    Diabetes - Type 2 - unspecified   ICD10: E11.9  ICD9: 250.00  Onset: 04/26/2013 09:50  Initial Date:    Posterior vitreous detachment   ICD10: H43.819  ICD9: 379.21  Onset: 04/26/2013 12:09  Initial Date: 09/20/2013  09:32  SURGERIES: Stroke 2008, Jan 2016  Express Shunt   OS 08/18/2013 Pick List - Surgeries SLT 360 OS  06/17/2013 14:25  MEDICATIONS: Lantus: Strength-   SIG-  Dose-  Freq-   12 units  Amlodipine (Norvasc):   10 mg tablet  SIG-  1 each   once a day   Plavix (Clopidogrel):  75 mg tablet  SIG-  1 tab(s)   once a day   Flomax (Tamsulosin):   0.4 mg capsule  SIG-  2 cap(s)   once a day   Coreg (Carvedilol):   12.5 mg tablet  SIG-  1 tab(s)   2 times a day   Senna (Senokot, Ex-Lax):    8.6 mg tablet    SIG-    1 or 2     once a day (at bedtime) as needed for constipation                Lipitor:    80 mg tablet   SIG-  1 each     once a day                  Synthroid (Levothyroxine): 25 mcg (0.025 mg) tablet  SIG-  1 each   once a day   Norco  (APAP/Hydrocodone):   325 mg-5 mg tablet SIG-  1 tab(s)   every 4 hours    Furosemide (Lasix):   40 mg tablet  SIG-  3 tab(s)    in the AM    Multivitamin: Strength-  SIG-  Dose-  Freq-    Aspirin:  Strength-    SIG-  1 each   once a day  Lumigan: 0.01% solution SIG-  1 DROP IN OD QHS    Humalog (Insulin Lispro):  100 units/mL (injection)  SIG-   as directed    as directed   sliding scale  Pred Forte: acetate 1% suspension SIG-  1 gtt in each affected eye 4 times a day for 5 days   Atropine Sulfate, Ophthalmic: 1% solution SIG-  1 DROP IN OS BID   Ciloxan (Ciprofloxacin) Solution: 0.3% solution SIG-  1 drop in OS BID   Zymaxid: 0.5% solution SIG-  1 drop in OS BID   Ciloxan (Ciprofloxacin) Solution: 0.3% solution SIG-  1 drop in OS BID   Zymaxid: 0.5% solution SIG-  1 drop in OS BID   Atropine Sulfate, Ophthalmic: 1% solution SIG-  1 DROP IN OS BID   Pred Forte: acetate 1% suspension SIG-  1 gtt in each affected eye 4 times a day for 5 days   Lumigan: 0.01% solution SIG-  1 DROP IN OD QHS  REVIEW OF SYSTEMS: not foundROS:   GEN- Constitutional: HENT: GEN - Endocrine: Reports symptoms of diabetes.    LUNGS/Respiratory:  HEART/Cardiovascular: Reports symptoms of hypertension.    ABD/Gastrointestinal:  Musculoskeletal (BJE): wheel chair bound  NEURO/Neurological: stroke left side  PSYCH/Psychiatric:    Is the pt oriented to time, place, person? 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  ALLERGIES: Drug Allergies.  No Known.  PHYSICAL EXAMINATION: VS: BMI: 24.2.  BP: 136/65.  H: 65.00 in.  P: 90 /min.  W: 145lbs 0oz.    Va06/28/2016 16:03  OD:[place] 20/NLP OS [place] 20/CF PH NI  EYEGLASSES: several yrs old (left at home) OD -0.25 +0.25 x005                  OS +4.00 +2.00 x111  ADD:   Auto Refraction: 08/25/2013 10:12  OD: defer OS: +2.50 +2.00 x127   MR 10/26/2013 10:07  OD: defer OS: +2.25 +1.75 x137 20/50  after liquigel ADD  K's OD: 41.75 axis 104       45.00  axis 014 OS: 42.25   axis 015      44.75  axis 105   VF: OD: Loss of vision in all four quadrants OS: decreased vision in all four quadrants  Motility:  orthophoria and full  PUPILS: +2 afferent pupillary defect OD, dilated 83mm non reactive OS  EYELIDS & OCULAR ADNEXA normal  SLE: Conjunctiva:pinguecula each eye quiet, -leak, bleb more difussed negative leak    filter 180 degrees OS   Cornea:  arcus each eye, 3-4+ central staining, cl in good position OS   Anterior Chamber: OS shallow w/iris touch 1CT over lens   tube in good position      Iris: brown  Lens: +3 nuclear sclerosis each eye 3+ psc OS, MATURE WHITE CATARACT OS  Vitreous: posterior vitreous detachment    Ta   in mmHg    OD    OS 13 Time06/28/2016 16:20   Gonio: angle looks closed several areas, partial tube occlusion   Dilation:  phenylephrine 2.5% trop 1% cyclop 1%   Fundus:  optic nerve  OD: Rim loss                                                  RC:VELF temporal w/small nerve    Macula      OD: Dull light reflex   OS: Dull light reflex   Vessels:narrow  Periphery: drusen OU, corital infusion Os   Exam: GENERAL: Appearance: HEAD, EARS, NOSE AND THROAT: Ears-Nose (external) Inspection: Externally, nose and ears are normal in appearance and without scars, lesions, or nodules.      Hearing assessment shows no problems with normal conversation.      LUNGS and RESPIRATORY: Lung  auscultation elicits no wheezing, rhonci, rales or rubs and with equal breath sounds.    Respiratory effort described as breathing is unlabored and chest movement is symmetrical.    HEART (Cardiovascular): Heart auscultation discovers regular rate and rhythm; no murmur, gallop or rub. Normal heart sounds.    ABDOMEN (Gastrointestinal): Mass/Tenderness Exam: Neither are present.     MUSCULOSKELETAL (BJE): Inspection-Palpation: No major bone, joint, tendon, or muscle changes.      NEUROLOGICAL: Alert and oriented. No major deficits of coordination or sensation.      PSYCHIATRIC: Insight and judgment appear  both to be intact and appropriate.    Mood and affect are described as normal mood and full affect.    SKIN: Skin Inspection: No rashes or lesions  ADMITTING DIAGNOSIS: Total mature senile cataract   ICD10: H25.89  ICD9: 366.17  Onset: 09/20/2014 16:25  Initial Date:     Serous choroidal detachment, left eye   ICD10: H31.422  ICD9:   NO VIEW PROBABLY RESOLVED   Primary open-angle glaucoma, severe stage   ICD10: H40.11X3  ICD9:   stable status post express tube OS Blindness, one eye, low vision other eye   ICD10: H54.10  ICD9: 369.10   Hypertension   ICD10:   ICD9: 401.9  Onset: 04/26/2013 09:50  Initial Date:    Diabetes - Type 2 - unspecified   ICD10: E11.9  ICD9: 250.00   Posterior vitreous detachment   ICD10: H43.819  ICD9: 379.21  Onset: 04/26/2013 12:09   H26.20 COMPLEX CATARACT  SURGICAL TREATMENT PLAN: ciloxan (ciprofloxacin) (tan top) twice a day in the left eye the day before surgery             Zymaxxid twice a day in the left eye the day before surgery  review head ct scan from [place]  APPLY TO TRYPAN BLUE  phaco emulsion cataract extraction  intraocular lens implant  OS may require ECCE due to density of cataract   Risk and benefits of surgery have been reviewed with the patient and the patient agrees to proceed with the surgical procedure.    needs clearance from Dr  Jeanie Cooks  continue atropine twice a day  start using pred forte once a day  LUMIGAN 1 DROP IN RIGHT EYE AT BEDTIME    ___________________________ [name], Jr. Starter - Inactive Problems: Cerebrovascular accident (CVA or stroke]   ICD10: I63.9  ICD9: 436  Onset: 04/11/2014  17:24  Initial Date:   Resolved: 08/16/2014  17:24   Stroke (Right Side) High Cholesterol                   History & Physical:   DATE:   11-22-14  [name], [name]      [id number]       HISTORY OF PRESENT ILLNESS: Referred by Dr. Claudean Kinds     Chief Eye Complaints Glaucoma  patient: Va seems to be worse. Patient fell and hit head a few weeks ago. Patient had an abrasion on the left cheek bone, along with a broken blood vessel in OS.  BS 168mg % this am.   HPI: EYES: Reports symptoms of dim vision only seeing eye.     LOCATION:   BOTH EYES        QUALITY/COURSE:   Reports condition is worsening.        INTENSITY/SEVERITY:    Reports measurement ( or degree) as severe .      DURATION:   Reports the general length of symptoms to be years.               ACTIVE PROBLEMS: Total mature senile cataract   ICD10: H25.89  ICD9: 366.17  Onset: 09/20/2014 16:25  Initial Date:      Primary open-angle glaucoma, severe stage   ICD10: H40.11X3  ICD9:   Onset: 12/29/2013 12:01  stable OS Blindness, one eye, low vision other eye   ICD10: H54.10  ICD9: 369.10  Onset: 02/01/2014 13:23  Initial Date:    Hypertension   ICD10:   ICD9: 401.9  Onset: 04/26/2013 09:50  Initial Date:    Diabetes - Type 2 - unspecified   ICD10: E11.9  ICD9: 250.00  Onset: 04/26/2013 09:50  Initial Date:    Posterior vitreous detachment   ICD10: H43.819  ICD9: 379.21  Onset: 04/26/2013 12:09  Initial Date: 09/20/2013  09:32  SURGERIES: Stroke 2008, Jan 2016  Express Shunt   OS 08/18/2013 Pick List - Surgeries SLT 360 OS  06/17/2013 14:25  MEDICATIONS: Lantus: Strength-   SIG-  Dose-  Freq-   12 units  Amlodipine (Norvasc):   10 mg  tablet  SIG-  1 each   once a day   Plavix (Clopidogrel):  75 mg tablet  SIG-  1 tab(s)   once a day   Flomax (Tamsulosin):   0.4 mg capsule  SIG-  2 cap(s)   once a day   Coreg (Carvedilol):   12.5 mg tablet  SIG-  1 tab(s)   2 times a day   Senna (Senokot, Ex-Lax):    8.6 mg tablet    SIG-    1 or 2     once a day (at bedtime) as needed for constipation               Lipitor:    80 mg tablet   SIG-  1 each     once a day                  Synthroid (Levothyroxine): 25 mcg (0.025 mg) tablet  SIG-  1 each   once a day   Norco  (APAP/Hydrocodone):   325 mg-5 mg tablet SIG-  1 tab(s)   every 4 hours    Furosemide (Lasix):   40 mg tablet  SIG-  3 tab(s)    in the AM    Multivitamin: Strength-  SIG-  Dose-  Freq-    Aspirin:  Strength-    SIG-  1 each   once a day   Lumigan: 0.01% solution SIG-  1 DROP IN OD QHS    Humalog (Insulin Lispro):  100 units/mL (injection)  SIG-   as directed  as directed   sliding scale  Pred Forte: acetate 1% suspension SIG-  1 gtt in each affected eye 4 times a day for 5 days   Atropine Sulfate, Ophthalmic: 1% solution SIG-  1 DROP IN OS BID   Ciloxan (Ciprofloxacin) Solution: 0.3% solution SIG-  1 drop in OS BID   Zymaxid: 0.5% solution SIG-  1 drop in OS BID   Ciloxan (Ciprofloxacin) Solution: 0.3% solution SIG-  1 drop in OS BID   Zymaxid: 0.5% solution SIG-  1 drop in OS BID   Atropine Sulfate, Ophthalmic: 1% solution SIG-  1 DROP IN OS BID   Pred Forte: acetate 1% suspension SIG-  1 gtt in each affected eye 4 times a day for 5 days   Lumigan: 0.01% solution SIG-  1 DROP IN OD QHS  REVIEW OF SYSTEMS: not foundROS:   GEN- Constitutional: HENT: GEN - Endocrine: Reports symptoms of diabetes.    LUNGS/Respiratory:  HEART/Cardiovascular: Reports symptoms of hypertension.    ABD/Gastrointestinal:  Musculoskeletal (BJE): wheel chair bound  NEURO/Neurological: stroke left side  PSYCH/Psychiatric:    Is the pt oriented to time, place,  person? 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  ALLERGIES: Drug Allergies.  No Known.  PHYSICAL EXAMINATION: VS: BMI: 24.2.  BP: 136/65.  H: 65.00 in.  P: 90 /min.  W: 145lbs 0oz.    Va06/28/2016 16:03  OD:[place] 20/NLP OS [place] 20/CF PH NI  EYEGLASSES: several yrs old (left at home) OD -0.25 +0.25 x005                  OS +4.00 +2.00 x111  ADD:   Auto Refraction: 08/25/2013 10:12  OD: defer OS: +2.50 +2.00 x127   MR 10/26/2013 10:07  OD: defer OS: +2.25 +1.75 x137 20/50  after liquigel ADD  K's OD: 41.75 axis 104       45.00  axis 014 OS: 42.25   axis 015      44.75  axis 105   VF: OD: Loss of vision in all four quadrants OS: decreased vision in all four quadrants  Motility:  orthophoria and full  PUPILS: +2 afferent pupillary defect OD, dilated 20mm non reactive OS  EYELIDS & OCULAR ADNEXA normal  SLE: Conjunctiva:pinguecula each eye quiet, -leak, bleb more difussed negative leak    filter 180 degrees OS   Cornea:  arcus each eye, 3-4+ central staining, cl in good position OS   Anterior Chamber: OS shallow w/iris touch 1CT over lens   tube in good position      Iris: brown  Lens: +3 nuclear sclerosis each eye 3+ psc OS, MATURE WHITE CATARACT OS  Vitreous: posterior vitreous detachment    Ta   in mmHg    OD    OS 13 Time06/28/2016 16:20   Gonio: angle looks closed several areas, partial tube occlusion   Dilation:  phenylephrine 2.5% trop 1% cyclop 1%   Fundus:  optic nerve  OD: Rim loss  OQ:HUTM temporal w/small nerve    Macula      OD: Dull light reflex   OS: Dull  light reflex   Vessels:narrow  Periphery: drusen OU, corital infusion Os   Exam: GENERAL: Appearance: HEAD, EARS, NOSE AND THROAT: Ears-Nose (external) Inspection: Externally, nose and ears are normal in appearance and without scars, lesions, or nodules.      Hearing assessment shows no problems with normal conversation.      LUNGS and RESPIRATORY: Lung auscultation elicits no wheezing, rhonci, rales or rubs and with equal breath sounds.    Respiratory effort described as breathing is unlabored and chest movement is symmetrical.    HEART (Cardiovascular): Heart auscultation discovers regular rate and rhythm; no murmur, gallop or rub. Normal heart sounds.    ABDOMEN (Gastrointestinal): Mass/Tenderness Exam: Neither are present.     MUSCULOSKELETAL (BJE): Inspection-Palpation: No major bone, joint, tendon, or muscle changes.      NEUROLOGICAL: Alert and oriented. No major deficits of coordination or sensation.      PSYCHIATRIC: Insight and judgment appear  both to be intact and appropriate.    Mood and affect are described as normal mood and full affect.    SKIN: Skin Inspection: No rashes or lesions  ADMITTING DIAGNOSIS: Total mature senile cataract   ICD10: H25.89  ICD9: 366.17  Onset: 09/20/2014 16:25  Initial Date:     Serous choroidal detachment, left eye   ICD10: H31.422  ICD9:   NO VIEW PROBABLY RESOLVED   Primary open-angle glaucoma, severe stage   ICD10: H40.11X3  ICD9:   stable status post express tube OS Blindness, one eye, low vision other eye   ICD10: H54.10  ICD9: 369.10   Hypertension   ICD10:   ICD9: 401.9  Onset: 04/26/2013 09:50  Initial Date:    Diabetes - Type 2 - unspecified   ICD10: E11.9  ICD9: 250.00   Posterior vitreous detachment   ICD10: H43.819  ICD9: 379.21  Onset: 04/26/2013 12:09   H26.20 COMPLEX CATARACT  SURGICAL TREATMENT PLAN: ciloxan (ciprofloxacin) (tan top) twice a day in the left eye the day before surgery             Zymaxxid twice a day in the  left eye the day before surgery  review head ct scan from [place]  APPLY TO TRYPAN BLUE  phaco emulsion cataract extraction  intraocular lens implant  OS may require ECCE due to density of cataract   Risk and benefits of surgery have been reviewed with the patient and the patient agrees to proceed with the surgical procedure.    needs clearance from Dr Jeanie Cooks  continue atropine twice a day  start using pred forte once a day  LUMIGAN 1 DROP IN RIGHT EYE AT BEDTIME    ___________________________ [name], Jr. Starter - Inactive Problems: Cerebrovascular accident (CVA or stroke]   ICD10: I63.9  ICD9: 436  Onset: 04/11/2014  17:24  Initial Date:   Resolved: 08/16/2014  17:24   Stroke (Right Side) High Cholesterol

## 2014-12-06 ENCOUNTER — Encounter (HOSPITAL_COMMUNITY): Payer: Self-pay | Admitting: Emergency Medicine

## 2014-12-06 NOTE — Progress Notes (Signed)
Anesthesia Chart Review:  Pt is 69 year old male scheduled for L cataract extraction phaco and intraocular lens placement on 12/07/2014 with Dr. Venetia Maxon.   Pt is a same day work up.   PMH includes: stroke (01/2007 and 03/2014), chronic combined systolic and diastolic CHF, HTN, DM, CKD (stage 4), hyperlipidemia, hepatitis C, GERD. Never smoker. BMI 22.   Medication list likely not up to date as no one has been able to get in touch with pt yet.   Preoperative labs will be obtained DOS.   Chest x-ray 04/09/2014 reviewed. Enlargement of cardiac silhouette. Tiny L pleural effusion.   EKG 09/17/2014: NSR. LVH. Nonspecific T wave abnormality  Carotid duplex US 04/13/2014: Bilateral: 1-39% ICA stenosis.  Echo 04/11/2014: - Left ventricle: The cavity size was normal. Wall thickness wasincreased in a pattern of severe LVH. Systolic function wasmildly reduced. The estimated ejection fraction was in the rangeof 45% to 50%. Wall motion was normal; there were no regionalwall motion abnormalities. Doppler parameters are consistent withabnormal left ventricular relaxation (grade 1 diastolicdysfunction).  Saw cardiology while inpatient after stroke 03/2014 for CHF management. Was supposed to f/u with cardiology after discharge but has not.   Reviewed case with Dr. Ermalene Postin. Pt will need to be evaluated by cardiology prior to surgery. Notified Dr. Gertie Exon office.   Willeen Cass, FNP-BC [facility]/Anesthesiology Phone: [phone] 12/06/2014 3:49 PM

## 2014-12-07 ENCOUNTER — Ambulatory Visit (HOSPITAL_COMMUNITY)
Admission: RE | Admit: 2014-12-07 | Payer: Commercial Managed Care - HMO | Source: Ambulatory Visit | Admitting: Ophthalmology

## 2014-12-07 ENCOUNTER — Encounter (HOSPITAL_COMMUNITY): Admission: RE | Payer: Self-pay | Source: Ambulatory Visit

## 2014-12-07 SURGERY — PHACOEMULSIFICATION, CATARACT, WITH IOL INSERTION
Anesthesia: General | Laterality: Left

## 2014-12-08 ENCOUNTER — Ambulatory Visit (INDEPENDENT_AMBULATORY_CARE_PROVIDER_SITE_OTHER): Payer: Commercial Managed Care - HMO | Admitting: Physician Assistant

## 2014-12-08 ENCOUNTER — Encounter: Payer: Self-pay | Admitting: Physician Assistant

## 2014-12-08 VITALS — BP 120/70 | HR 72 | Ht 64.0 in | Wt 145.0 lb

## 2014-12-08 DIAGNOSIS — E785 Hyperlipidemia, unspecified: Secondary | ICD-10-CM | POA: Diagnosis not present

## 2014-12-08 DIAGNOSIS — I5040 Unspecified combined systolic (congestive) and diastolic (congestive) heart failure: Secondary | ICD-10-CM

## 2014-12-08 DIAGNOSIS — I10 Essential (primary) hypertension: Secondary | ICD-10-CM

## 2014-12-08 DIAGNOSIS — Z01810 Encounter for preprocedural cardiovascular examination: Secondary | ICD-10-CM | POA: Diagnosis not present

## 2014-12-08 DIAGNOSIS — N184 Chronic kidney disease, stage 4 (severe): Secondary | ICD-10-CM

## 2014-12-08 DIAGNOSIS — Z8673 Personal history of transient ischemic attack (TIA), and cerebral infarction without residual deficits: Secondary | ICD-10-CM

## 2014-12-08 NOTE — Patient Instructions (Signed)
Medication Instructions:  Your physician recommends that you continue on your current medications as directed. Please refer to the Current Medication list given to you today.   Labwork: NONE  Testing/Procedures: Your physician has requested that you have an echocardiogram. Echocardiography is a painless test that uses sound waves to create images of your heart. It provides your doctor with information about the size and shape of your heart and how well your heart's chambers and valves are working. This procedure takes approximately one hour. There are no restrictions for this procedure.    Follow-Up: 3 MONTHS WITH DR. Johnsie Cancel  Any Other Special Instructions Will Be Listed Below (If Applicable).

## 2014-12-08 NOTE — Addendum Note (Signed)
Addended by: Briant Cedar on: 12/08/2014 03:17 PM   Modules accepted: Orders, Medications

## 2014-12-08 NOTE — Progress Notes (Signed)
Cardiology Office Note   Date:  12/08/2014   ID:  Matthew, [name] Dec 17, 1945, MRN [id number]  Patient Care Team: Nolene Ebbs, MD as PCP - General (Internal Medicine) Nolene Ebbs, MD (Internal Medicine) [name], MD as Consulting Physician (Ophthalmology) Jamal Maes, MD as Consulting Physician (Nephrology) Josue Hector, MD as Consulting Physician (Cardiology)    Chief Complaint  Patient presents with  . Surgical Clearance  . Congestive Heart Failure     History of Present Illness: [name] Current is a 69 y.o. [place] male with a hx of HTN, HL, DM2, CKD stage 3, prior CVA.  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 Patient was evaluated by our service in the hospital in January 2016. At that time, he presented with increasing edema of his lower extremities, worsening abdominal distention and overall generalized weakness. He was noted to be volume overloaded. Echocardiogram demonstrated an EF 45-50% with no regional wall motion abnormalities, severe LVH and mild diastolic dysfunction. He was seen by cardiology. He did have minimally elevated troponin levels. This was felt to be related to chronic kidney disease and acute renal failure. He was not felt to be a candidate for cardiac catheterization given his advanced chronic kidney disease. Of note, baseline creatinine of 1.4 had increased to 3.05 during his admission. He was seen by nephrology. He was placed on appropriate heart failure medications and diuresed. He was also evaluated by neurology. He was diagnosed with acute right hemispheric CVA by brain MRI. Carotid US demonstrated bilateral 1-39% ICA stenosis. Records indicate he was to have a follow-up event monitor arranged through cardiology. The patient has never been seen in the office since discharge from the hospital. Patient was again admitted in 6/16 for chest pain. Troponin levels were normal and VQ scan was low probability  for pulmonary embolism. No further workup was pursued. He was not seen by cardiology.  He is followed by Dr. Lorrene Reid for nephrology. I do have a copy of her most recent office note. He is not yet ready for dialysis. The patient is referred back to [place] for surgical clearance. Dr. Venetia Maxon wants to perform cataract surgery.  He is here today with his daughter. He is bedbound. He arrives in a wheelchair. He is sleeping throughout most of the visit today. His daughter provides most of the history. The patient denies any chest discomfort. He denies dyspnea at rest. He denies orthopnea, PND or edema. He denies syncope.   Studies/Reports Reviewed Today:  Carotid US 04/13/14 bilat ICA 1-39%  Echo 04/11/14 Severe LVH, EF 45-50%, no RWMA, Gr 1 DD   Past Medical History  Diagnosis Date  . Hypertension   . Hypercholesterolemia   . Glaucoma   . Cerebrovascular disease     a. 01/2007 MRI/A: L MCA 32-54%, R MCA 98%, LICA 26%.  . Chronic combined systolic and diastolic CHF, NYHA class 2     a. 01/2007 Echo: EF 60-65%;  b. 03/2014 Echo: EF 45-50%, Gr 1 DD, severe LVH.  Marland Kitchen Headache   . Bedridden     transfers to chair  . Pneumonia 10/2006  . Hypothyroidism   . Type II diabetes mellitus   . GERD (gastroesophageal reflux disease)   . Stroke 01/2007    L corona radiata infarct; "left his right side weak" (09/16/2014)  . Stroke 03/2014    "while in hospital; left side extremely weak since" (09/16/2014)  . Hepatitis dx'd 03/2014    C  .  CKD (chronic kidney disease), stage IV     Past Surgical History  Procedure Laterality Date  . Colonoscopy w/ biopsies and polypectomy  2002  . Colon surgery    . Trabeculectomy Left 08/18/2013    Procedure: TRABECULECTOMY WITH TUBE WITH [facility];  Surgeon: [name], MD;  Location: [facility];  Service: Ophthalmology;  Laterality: Left;  . Colonoscopy  2015     Current Outpatient Prescriptions  Medication Sig Dispense Refill  . amLODipine (NORVASC) 10 MG tablet Take 1  tablet (10 mg total) by mouth daily.    Marland Kitchen aspirin EC 325 MG tablet Take 325 mg by mouth daily.    Marland Kitchen atorvastatin (LIPITOR) 80 MG tablet Take 1 tablet (80 mg total) by mouth daily at 6 PM.    . atropine 1 % ophthalmic solution     . carvedilol (COREG) 12.5 MG tablet Take 1 tablet (12.5 mg total) by mouth 2 (two) times daily with a meal.    . ciprofloxacin (CILOXAN) 0.3 % ophthalmic solution Place 1 drop into the left eye 2 (two) times daily.     . clopidogrel (PLAVIX) 75 MG tablet Take 1 tablet (75 mg total) by mouth daily.    . furosemide (LASIX) 40 MG tablet Take 3 tablets (120 mg total) by mouth 2 (two) times daily. 30 tablet   . gabapentin (NEURONTIN) 100 MG capsule Take 100 mg by mouth 3 (three) times daily.     Marland Kitchen HYDROcodone-acetaminophen (NORCO/VICODIN) 5-325 MG per tablet Take 1 tablet by mouth every 4 (four) hours as needed for moderate pain. 30 tablet 0  . insulin glargine (LANTUS) 100 UNIT/ML injection Inject 0.05 mLs (5 Units total) into the skin at bedtime. (Patient taking differently: Inject 12 Units into the skin at bedtime. ) 10 mL 11  . insulin lispro (HUMALOG) 100 UNIT/ML cartridge Inject 5 Units into the skin 3 (three) times daily with meals. Use PRN for blood sugar above 200    . levothyroxine (SYNTHROID, LEVOTHROID) 100 MCG tablet Take 100 mcg by mouth daily.    . Multiple Vitamins-Minerals (DECUBI-VITE PO) Take 1 tablet by mouth daily.    Marland Kitchen omeprazole (PRILOSEC) 40 MG capsule Take 1 capsule (40 mg total) by mouth daily. 30 capsule 1  . OVER THE COUNTER MEDICATION Take 1 scoop by mouth 2 (two) times daily. Protein powder mixed with food/drink    . polyethylene glycol (MIRALAX / GLYCOLAX) packet Take 17 g by mouth daily. (Patient taking differently: Take 17 g by mouth 2 (two) times daily. ) 14 each 0  . senna (SENOKOT) 8.6 MG tablet Take 1 tablet by mouth 2 (two) times daily.    . tamsulosin (FLOMAX) 0.4 MG CAPS capsule Take 0.4 mg by mouth daily.      No current  facility-administered medications for this visit.    Allergies:   Review of patient's allergies indicates no known allergies.    Social History:  The patient  reports that he has never smoked. He has never used smokeless tobacco. He reports that he does not drink alcohol or use illicit drugs.   Family History:  The patient's Family history is unknown by patient.    ROS:   Please see the history of present illness.   Review of Systems  All other systems reviewed and are negative.     PHYSICAL EXAM: VS:  BP 120/70 mmHg  Pulse 72  Ht 5\' 4"  (1.626 m)  Wt 145 lb (65.772 kg)  BMI 24.88 kg/m2  Wt Readings from Last 3 Encounters:  12/08/14 145 lb (65.772 kg)  09/17/14 126 lb 12.8 oz (57.516 kg)  06/23/14 138 lb 12.8 oz (62.959 kg)     GEN: chronically ill appearing male in a wheelchair in no acute distress HEENT: normal Neck: no JVD, no masses Cardiac:  Normal S1/S2, RRR; no murmur ,  no rubs or gallops, trace ankle edema   Respiratory:  clear to auscultation bilaterally, no wheezing, rhonchi or rales. GI: soft, nontender, nondistended, + BS MS: no deformity or atrophy Skin: warm and dry  Neuro:  CNs II-XII intact, Strength and sensation are intact Psych: Normal affect   EKG:  EKG is ordered today.  It demonstrates:   NSR, HR 72, NSSTTW changes   Recent Labs: 04/09/2014: B Natriuretic Peptide 708.3* 09/17/2014: ALT 50; BUN 80*; Creatinine, Ser 1.93*; Hemoglobin 10.5*; Platelets 184; Potassium 3.4*; Sodium 142    Lipid Panel    Component Value Date/Time   CHOL 268* 04/12/2014 0424   TRIG 124 04/12/2014 0424   HDL 58 04/12/2014 0424   CHOLHDL 4.6 04/12/2014 0424   VLDL 25 04/12/2014 0424   LDLCALC 185* 04/12/2014 0424      ASSESSMENT AND PLAN: Pre-operative cardiovascular examination  Combined systolic and diastolic heart failure, unspecified heart failure chronicity - Plan: EKG 12-Lead, Echocardiogram  Essential hypertension  Hyperlipidemia  History of  stroke  CKD (chronic kidney disease), stage 4 (severe)  Surgical Clearance:   The patient needs cataract surgery. He does not have any unstable cardiac conditions. He had an admission to the hospital in January 2016 with acute heart failure in the setting of acute on chronic renal failure. This was exacerbated by hypertensive urgency. He has been stable since that time. His echocardiogram did demonstrate an EF of 45-50% but no regional wall motion abnormalities. He has not been felt to be a candidate for cardiac catheterization given his chronic kidney disease. Cataract surgery is very low risk.  I reviewed his case with Dr. Jenkins Rouge.  He does not require further cardiac testing and may proceed with his surgery at acceptable risk.    Chronic Combined Systolic and Diastolic CHF:  Volume stable.  Managed by Nephrology.  I will arrange a FU echo.  If EF worse, will need to consider changing Amlodipine to Hydralazine and Nitrates.   HTN:  Controlled.   Hyperlipidemia:  Continue statin.   Prior CVA:  Continue ASA and Plavix.  No need for event monitor at this time.   Chronic Kidney Disease:  Renal fxn has improved.  FU with Neph.     Medication Changes: Current medicines are reviewed at length with the patient today.  Concerns regarding medicines are as outlined above.  The following changes have been made:   Discontinued Medications   No medications on file   Modified Medications   No medications on file   New Prescriptions   No medications on file    Labs/ tests ordered today include:   Orders Placed This Encounter  Procedures  . EKG 12-Lead  . Echocardiogram      Disposition:    FU with Dr. Jenkins Rouge 3 mos (after echo)   Signed, Richardson Dopp, PA-C, MHS 12/08/2014 2:40 PM    [address], [place], [place]  [address] Phone: [phone]; Fax: [phone]

## 2014-12-13 ENCOUNTER — Other Ambulatory Visit: Payer: Self-pay | Admitting: Ophthalmology

## 2014-12-14 ENCOUNTER — Other Ambulatory Visit: Payer: Self-pay | Admitting: Ophthalmology

## 2014-12-14 MED ORDER — CYCLOPENTOLATE HCL 1 % OP SOLN: 1.0000 [drp] | OPHTHALMIC | Status: AC

## 2014-12-14 MED ORDER — GATIFLOXACIN 0.5 % OP SOLN: 1.0000 [drp] | OPHTHALMIC | Status: DC | PRN

## 2014-12-14 MED ORDER — KETOROLAC TROMETHAMINE 0.5 % OP SOLN: 1.0000 [drp] | OPHTHALMIC | Status: AC

## 2014-12-14 MED ORDER — TROPICAMIDE 1 % OP SOLN: 1.0000 [drp] | OPHTHALMIC | Status: AC

## 2014-12-14 MED ORDER — PHENYLEPHRINE HCL 2.5 % OP SOLN: 1.0000 [drp] | OPHTHALMIC | Status: AC

## 2014-12-16 ENCOUNTER — Other Ambulatory Visit: Payer: Self-pay | Admitting: Ophthalmology

## 2014-12-20 ENCOUNTER — Encounter (HOSPITAL_COMMUNITY): Payer: Self-pay | Admitting: *Deleted

## 2014-12-20 ENCOUNTER — Other Ambulatory Visit: Payer: Self-pay | Admitting: Ophthalmology

## 2014-12-20 MED ORDER — TETRACAINE HCL 0.5 % OP SOLN: 1.0000 [drp] | OPHTHALMIC | Status: AC

## 2014-12-20 MED ORDER — PHENYLEPHRINE HCL 2.5 % OP SOLN: 1.0000 [drp] | Freq: Once | OPHTHALMIC | Status: DC

## 2014-12-20 NOTE — H&P (Signed)
History & Physical:   DATE:   11-22-14  NAMECHRISTAPHER, [name]      [id number]       HISTORY OF PRESENT ILLNESS: Referred by Dr. Claudean Kinds     Chief Eye Complaints Glaucoma  patient: Va seems to be worse. pt cannot see to eat or move around. Patient fell and hit head a few weeks ago. Patient had an abrasion on the left cheek bone, along with a broken blood vessel in OS.  BS 168mg % this am.   HPI: EYES: Reports symptoms of dim vision almost totally blind.     LOCATION:   BOTH EYES        QUALITY/COURSE:   Reports condition is worsening.        INTENSITY/SEVERITY:    Reports measurement ( or degree) as severe .      DURATION:   Reports the general length of symptoms to be years.      ONSET/TIMING:   Reports occurrence as   CONTEXT/WHEN:   Reports usually associated with   MODIFIERS/TREATMENTS:  Improved by               ACTIVE PROBLEMS: Total mature senile cataract   ICD10: H25.89  ICD9: 366.17  Onset: 09/20/2014 16:25  Initial Date:     Serous choroidal detachment, left eye   ICD10: H31.422  ICD9:   NO VIEW PROBABLY RESOLVED   Primary open-angle glaucoma, severe stage   ICD10: H40.11X3  ICD9:   Onset: 12/29/2013 12:01  stable OS Blindness, one eye, low vision other eye   ICD10: H54.10  ICD9: 369.10  Onset: 02/01/2014 13:23  Initial Date:    Hypertension   ICD10:   ICD9: 401.9  Onset: 04/26/2013 09:50  Initial Date:    Diabetes - Type 2 - unspecified   ICD10: E11.9  ICD9: 250.00  Onset: 04/26/2013 09:50  Initial Date:    Posterior vitreous detachment   ICD10: H43.819  ICD9: 379.21  Onset: 04/26/2013 12:09  Initial Date: 09/20/2013  09:32  SURGERIES: Stroke 2008, Jan 2016  Express Shunt   OS 08/18/2013 Pick List - Surgeries SLT 360 OS  06/17/2013 14:25  MEDICATIONS: Lantus: Strength-   SIG-  Dose-  Freq-   12 units  Amlodipine (Norvasc):   10 mg tablet  SIG-  1 each   once a day   Plavix (Clopidogrel):  75 mg tablet  SIG-  1 tab(s)   once a day   Flomax  (Tamsulosin):   0.4 mg capsule  SIG-  2 cap(s)   once a day   Coreg (Carvedilol):   12.5 mg tablet  SIG-  1 tab(s)   2 times a day   Senna (Senokot, Ex-Lax):    8.6 mg tablet    SIG-    1 or 2     once a day (at bedtime) as needed for constipation               Lipitor:    80 mg tablet   SIG-  1 each     once a day                  Synthroid (Levothyroxine): 25 mcg (0.025 mg) tablet  SIG-  1 each   once a day   Norco  (APAP/Hydrocodone):   325 mg-5 mg tablet SIG-  1 tab(s)   every 4 hours  Furosemide (Lasix):   40 mg tablet  SIG-  3 tab(s)    in the AM    Multivitamin: Strength-  SIG-  Dose-  Freq-    Aspirin:  Strength-    SIG-  1 each   once a day   Lumigan: 0.01% solution SIG-  1 DROP IN OD QHS    Humalog (Insulin Lispro):  100 units/mL (injection)  SIG-   as directed    as directed   sliding scale  Pred Forte: acetate 1% suspension SIG-  1 gtt in each affected eye 4 times a day for 5 days   Atropine Sulfate, Ophthalmic: 1% solution SIG-  1 DROP IN OS BID   Ciloxan (Ciprofloxacin) Solution: 0.3% solution SIG-  1 drop in OS BID   Zymaxid: 0.5% solution SIG-  1 drop in OS BID   Ciloxan (Ciprofloxacin) Solution: 0.3% solution SIG-  1 drop in OS BID  REVIEW OF SYSTEMS: ROS:   GEN- Constitutional: HENT: GEN - Endocrine: Reports symptoms of diabetes.    LUNGS/Respiratory:  HEART/Cardiovascular: Reports symptoms of hypertension.    ABD/Gastrointestinal:  Musculoskeletal (BJE): NEURO/Neurological: stroke left side  PSYCH/Psychiatric:    Is the pt oriented to time, place, person? yes Mood  normal   TOBACCO: Never smoker   ICD10: R42.706 ICD9: V13.89 Onset: 04/26/2013 09:52 Initial Date:   Tobacco use:     Tobacco cessation:          Smoker Status:   Never smoker   ICD10: Z87.898 ICD9: V13.89 Onset: 04/26/2013 09:52 Initial Date:  SOCIAL HISTORY: Starter Pick List - Social History  Retired [employer] manager of [employer]  FAMILY HISTORY: Family History - 1st Degree Relatives:  Mother dead.  ALLERGIES: Drug Allergies.  No Known.  PHYSICAL EXAMINATION: VS: BMI: 24.2.  BP: 136/65.  H: 65.00 in.  P: 90 /min.  W: 145lbs 0oz.    Va06/28/2016 16:03  OD:[place] 20/NLP OS [place] 20/CF PH NI  EYEGLASSES: several yrs old (left at home) OD -0.25 +0.25 x005                  OS +4.00 +2.00 x111  ADD:   Auto Refraction: 08/25/2013 10:12  OD: defer OS: +2.50 +2.00 x127   MR 10/26/2013 10:07  OD: defer OS: +2.25 +1.75 x137 20/50  after liquigel ADD  K's OD: 41.75 axis 104       45.00  axis 014 OS: 42.25   axis 015      44.75  axis 105   VF: OD: Loss of vision in all four quadrants OS: decreased vision in all four quadrants  Motility:  orthophoria and full  PUPILS: +2 afferent pupillary defect OD, dilated 73mm non reactive OS  EYELIDS & OCULAR ADNEXA normal  SLE: Conjunctiva:pinguecula each eye quiet, -leak, bleb more difussed negative leak    filter 180 degrees OS   Cornea:  arcus each eye, 3-4+ central staining, cl in good position OS   Anterior Chamber: OS shallow w/iris touch 1CT over lens   tube in good position      Iris: brown  Lens: +3 nuclear sclerosis each eye 3+ psc OS, MATURE WHITE CATARACT OS  Vitreous: posterior vitreous detachment    CCT:  Ta   in mmHg    OD    OS 13 Time06/28/2016 16:20   Gonio: angle looks closed several areas, partial tube occlusion   Dilation:  phenylephrine 2.5% trop 1% cyclop 1%  Fundus:  optic nerve   OD: Rim loss                                                   HD:QQIW temporal w/small nerve    Macula       OD: Dull light reflex  OS: Dull light reflex   Vessels:narrow  Periphery: drusen OU, corital infusion Os    Exam: GENERAL: Appearance: HEAD, EARS, NOSE AND THROAT: Ears-Nose (external) Inspection: Externally, nose and ears are normal in appearance and without scars, lesions, or nodules.      Hearing assessment shows no problems  with normal conversation.      LUNGS and RESPIRATORY: Lung auscultation elicits no wheezing, rhonci, rales or rubs and with equal breath sounds.    Respiratory effort described as breathing is unlabored and chest movement is symmetrical.    HEART (Cardiovascular): Heart auscultation discovers regular rate and rhythm; no murmur, gallop or rub. Normal heart sounds.    ABDOMEN (Gastrointestinal): Mass/Tenderness Exam: Neither are present.     MUSCULOSKELETAL (BJE): Inspection-Palpation: No major bone, joint, tendon, or muscle changes.      NEUROLOGICAL: Alert and oriented. No major deficits of coordination or sensation.      PSYCHIATRIC: Insight and judgment appear  both to be intact and appropriate.    Mood and affect are described as normal mood and full affect.    SKIN: Skin Inspection: No rashes or lesions  ADMITTING DIAGNOSIS: Total mature senile cataract   ICD10: H25.89  ICD9: 366.17  Onset: 09/20/2014 16:25  Initial Date:     Serous choroidal detachment, left eye   ICD10: H31.422  ICD9:   NO VIEW PROBABLY RESOLVED   Primary open-angle glaucoma, severe stage   ICD10: H40.11X3  ICD9:   stable status post express tube OS Blindness, one eye, low vision other eye   ICD10: H54.10  ICD9: 369.10   Hypertension   ICD10:   ICD9: 401.9  Onset: 04/26/2013 09:50  Initial Date:    Diabetes - Type 2 - unspecified   ICD10: E11.9  ICD9: 250.00   Posterior vitreous detachment   ICD10: H43.819  ICD9: 379.21  Onset: 04/26/2013 12:09   H26.20 COMPLEX CATARACT  SURGICAL TREATMENT PLAN: ciloxan (ciprofloxacin) (tan top) twice a day in the left eye the day before surgery             Zymaxxid twice a day in the left eye the day before surgery  review head ct scan from [place]   APPLY TO TRYPAN BLUE  phaco emulsion cataract extraction  intraocular lens implant  OS may require ECCE due to density of cataract   Risk and benefits of surgery have been reviewed with the patient and the patient agrees to proceed  with the surgical procedure.    needs clearance from Dr Jeanie Cooks  continue atropine twice a day  start using pred forte once a day  LUMIGAN 1 DROP IN RIGHT EYE AT BEDTIME      Actions:     Handouts: Hypertension, glaucoma , what is glaucoma?, glaucoma treatment, Hypertension, glaucoma , what is glaucoma?, glaucoma treatment, Script Guide - Lumigan, Script Guide - Lumigan.    ___________________________ [name], Jr. Starter - Inactive Problems: Cerebrovascular accident (CVA or stroke]   ICD10: I63.9  ICD9: 436  Onset: 04/11/2014  17:24  Initial  Date:   Resolved: 08/16/2014  17:24   Stroke (Right Side) High Cholesterol

## 2014-12-20 NOTE — Progress Notes (Signed)
I called and spoke with [name] daughter, [name]- at 41.  [name] was upset that surgery time is at 0930, she was told 1030 and patient has to be transferred via Medical transportation and they will be picking patient up between 7:30 and 8:00am and the office for transportation is closed.  [name] also was upset when I instructed her that patient will be NPO after midnight. [name] reported that Dr Venetia Maxon told her that patient should not have anything to eat after 6 hours prior to surgery. [name]'s concern is that patient is a diabetic and that he has low blood surgery in am. I encouraged her to have patient  Eat at snack with protein at bedtime.  I also instructed [name]  to check blood sugar in am and to give patient 1/2 cup of a clear liquid juice  Or a tube of Glucose Gel if CBG < 70 and to recheck in 15 minutes and I gave her the phone number to Pre- op to call if blood sugar is still; < 70.   Patient currently takes 10 units of Lantus at HS.  I informed [name] that per Anesthesiologist order that patient could take 8 units of Lantus at HS tonight.[name] stated that she will speak to her mother,who is a nurse about patinet's evening Insulin. I received a call from Dr Venetia Maxon during my call with [facility].  I informed Dr Venetia Maxon  that patient will not be able to arrive here until 8:15- 8:30 and he said that will be ok, "he is 2nd case". I also informed Dr Venetia Maxon of [name]'s concerns and he said to encourage patient to have a HS snack.   I called [name] back and informed he of what Dr Venetia Maxon said about arrival time and snack.

## 2014-12-21 ENCOUNTER — Encounter (HOSPITAL_COMMUNITY): Payer: Self-pay | Admitting: Certified Registered Nurse Anesthetist

## 2014-12-21 ENCOUNTER — Ambulatory Visit (HOSPITAL_COMMUNITY)
Admission: RE | Admit: 2014-12-21 | Discharge: 2014-12-21 | Disposition: A | Discharge status: Home / Self Care | Payer: Commercial Managed Care - HMO | Source: Ambulatory Visit | Attending: Ophthalmology | Admitting: Ophthalmology

## 2014-12-21 ENCOUNTER — Ambulatory Visit (HOSPITAL_COMMUNITY): Payer: Commercial Managed Care - HMO | Admitting: Certified Registered Nurse Anesthetist

## 2014-12-21 ENCOUNTER — Encounter (HOSPITAL_COMMUNITY)
Admission: RE | Disposition: A | Discharge status: Home / Self Care | Payer: Self-pay | Source: Ambulatory Visit | Attending: Ophthalmology

## 2014-12-21 DIAGNOSIS — E1151 Type 2 diabetes mellitus with diabetic peripheral angiopathy without gangrene: Secondary | ICD-10-CM | POA: Diagnosis not present

## 2014-12-21 DIAGNOSIS — Z794 Long term (current) use of insulin: Secondary | ICD-10-CM | POA: Insufficient documentation

## 2014-12-21 DIAGNOSIS — H43819 Vitreous degeneration, unspecified eye: Secondary | ICD-10-CM | POA: Insufficient documentation

## 2014-12-21 DIAGNOSIS — Z792 Long term (current) use of antibiotics: Secondary | ICD-10-CM | POA: Diagnosis not present

## 2014-12-21 DIAGNOSIS — F329 Major depressive disorder, single episode, unspecified: Secondary | ICD-10-CM | POA: Insufficient documentation

## 2014-12-21 DIAGNOSIS — Z79891 Long term (current) use of opiate analgesic: Secondary | ICD-10-CM | POA: Diagnosis not present

## 2014-12-21 DIAGNOSIS — H11159 Pinguecula, unspecified eye: Secondary | ICD-10-CM | POA: Diagnosis not present

## 2014-12-21 DIAGNOSIS — Z7902 Long term (current) use of antithrombotics/antiplatelets: Secondary | ICD-10-CM | POA: Insufficient documentation

## 2014-12-21 DIAGNOSIS — I509 Heart failure, unspecified: Secondary | ICD-10-CM | POA: Diagnosis not present

## 2014-12-21 DIAGNOSIS — Z7952 Long term (current) use of systemic steroids: Secondary | ICD-10-CM | POA: Insufficient documentation

## 2014-12-21 DIAGNOSIS — H31422 Serous choroidal detachment, left eye: Secondary | ICD-10-CM | POA: Insufficient documentation

## 2014-12-21 DIAGNOSIS — H5411 Blindness, right eye, low vision left eye: Secondary | ICD-10-CM | POA: Diagnosis not present

## 2014-12-21 DIAGNOSIS — H2522 Age-related cataract, morgagnian type, left eye: Secondary | ICD-10-CM | POA: Diagnosis present

## 2014-12-21 DIAGNOSIS — K219 Gastro-esophageal reflux disease without esophagitis: Secondary | ICD-10-CM | POA: Insufficient documentation

## 2014-12-21 DIAGNOSIS — Z7982 Long term (current) use of aspirin: Secondary | ICD-10-CM | POA: Diagnosis not present

## 2014-12-21 DIAGNOSIS — I10 Essential (primary) hypertension: Secondary | ICD-10-CM | POA: Insufficient documentation

## 2014-12-21 DIAGNOSIS — H4011X3 Primary open-angle glaucoma, severe stage: Secondary | ICD-10-CM | POA: Diagnosis not present

## 2014-12-21 DIAGNOSIS — E039 Hypothyroidism, unspecified: Secondary | ICD-10-CM | POA: Diagnosis not present

## 2014-12-21 DIAGNOSIS — Z79899 Other long term (current) drug therapy: Secondary | ICD-10-CM | POA: Diagnosis not present

## 2014-12-21 DIAGNOSIS — Z8673 Personal history of transient ischemic attack (TIA), and cerebral infarction without residual deficits: Secondary | ICD-10-CM | POA: Insufficient documentation

## 2014-12-21 DIAGNOSIS — H35363 Drusen (degenerative) of macula, bilateral: Secondary | ICD-10-CM | POA: Diagnosis not present

## 2014-12-21 HISTORY — PX: CATARACT EXTRACTION EXTRACAPSULAR: SHX1305

## 2014-12-21 HISTORY — DX: Depression, unspecified: F32.A

## 2014-12-21 HISTORY — DX: Major depressive disorder, single episode, unspecified: F32.9

## 2014-12-21 LAB — COMPREHENSIVE METABOLIC PANEL
ALT: 109 U/L — ABNORMAL HIGH (ref 17–63)
ANION GAP: 7 (ref 5–15)
AST: 98 U/L — ABNORMAL HIGH (ref 15–41)
Albumin: 3.2 g/dL — ABNORMAL LOW (ref 3.5–5.0)
Alkaline Phosphatase: 106 U/L (ref 38–126)
BUN: 93 mg/dL — ABNORMAL HIGH (ref 6–20)
CALCIUM: 9.2 mg/dL (ref 8.9–10.3)
CHLORIDE: 104 mmol/L (ref 101–111)
CO2: 29 mmol/L (ref 22–32)
Creatinine, Ser: 1.91 mg/dL — ABNORMAL HIGH (ref 0.61–1.24)
GFR calc non Af Amer: 34 mL/min — ABNORMAL LOW (ref >60)
GFR, EST AFRICAN AMERICAN: 40 mL/min — AB (ref >60)
Glucose, Bld: 162 mg/dL — ABNORMAL HIGH (ref 65–99)
POTASSIUM: 4.2 mmol/L (ref 3.5–5.1)
SODIUM: 140 mmol/L (ref 135–145)
Total Bilirubin: 0.7 mg/dL (ref 0.3–1.2)
Total Protein: 6.9 g/dL (ref 6.5–8.1)

## 2014-12-21 LAB — CBC
HEMATOCRIT: 34.3 % — AB (ref 39.0–52.0)
HEMOGLOBIN: 11.4 g/dL — AB (ref 13.0–17.0)
MCH: 31.2 pg (ref 26.0–34.0)
MCHC: 33.2 g/dL (ref 30.0–36.0)
MCV: 94 fL (ref 78.0–100.0)
Platelets: 127 10*3/uL — ABNORMAL LOW (ref 150–400)
RBC: 3.65 MIL/uL — AB (ref 4.22–5.81)
RDW: 13.8 % (ref 11.5–15.5)
WBC: 4.6 10*3/uL (ref 4.0–10.5)

## 2014-12-21 LAB — GLUCOSE, CAPILLARY
GLUCOSE-CAPILLARY: 166 mg/dL — AB (ref 65–99)
Glucose-Capillary: 129 mg/dL — ABNORMAL HIGH (ref 65–99)

## 2014-12-21 LAB — PROTIME-INR
INR: 1.11 (ref 0.00–1.49)
PROTHROMBIN TIME: 14.5 s (ref 11.6–15.2)

## 2014-12-21 SURGERY — EXTRACTION, CATARACT, WITH IOL INSERTION
Anesthesia: Monitor Anesthesia Care | Site: Eye | Laterality: Left

## 2014-12-21 MED ORDER — TETRACAINE HCL 0.5 % OP SOLN
OPHTHALMIC | Status: AC
  Filled 2014-12-21: qty 2

## 2014-12-21 MED ORDER — SODIUM HYALURONATE 10 MG/ML IO SOLN
INTRAOCULAR | Status: DC | PRN
  Administered 2014-12-21: 0.85 mL via INTRAOCULAR

## 2014-12-21 MED ORDER — LIDOCAINE HCL 2 % IJ SOLN
INTRAMUSCULAR | Status: AC
  Filled 2014-12-21: qty 20

## 2014-12-21 MED ORDER — PHENYLEPHRINE-KETOROLAC 1-0.3 % IO SOLN
INTRAOCULAR | Status: DC | PRN
  Administered 2014-12-21: 500 mL via OPHTHALMIC

## 2014-12-21 MED ORDER — TETRACAINE HCL 0.5 % OP SOLN
OPHTHALMIC | Status: DC | PRN
  Administered 2014-12-21: 1 [drp] via OPHTHALMIC

## 2014-12-21 MED ORDER — KETOROLAC TROMETHAMINE 0.5 % OP SOLN
1.0000 [drp] | OPHTHALMIC | Status: DC
  Administered 2014-12-21: 1 [drp] via OPHTHALMIC

## 2014-12-21 MED ORDER — CYCLOPENTOLATE HCL 1 % OP SOLN
1.0000 [drp] | OPHTHALMIC | Status: DC | PRN
  Administered 2014-12-21 (×4): 1 [drp] via OPHTHALMIC
  Filled 2014-12-21: qty 2

## 2014-12-21 MED ORDER — 0.9 % SODIUM CHLORIDE (POUR BTL) OPTIME
TOPICAL | Status: DC | PRN
  Administered 2014-12-21: 200 mL

## 2014-12-21 MED ORDER — ONDANSETRON HCL 4 MG/2ML IJ SOLN
INTRAMUSCULAR | Status: AC
  Filled 2014-12-21: qty 2

## 2014-12-21 MED ORDER — PHENYLEPHRINE HCL 2.5 % OP SOLN
1.0000 [drp] | OPHTHALMIC | Status: DC
  Administered 2014-12-21: 1 [drp] via OPHTHALMIC

## 2014-12-21 MED ORDER — GENTAMICIN SULFATE 40 MG/ML IJ SOLN
INTRAMUSCULAR | Status: AC
  Filled 2014-12-21: qty 2

## 2014-12-21 MED ORDER — TOBRAMYCIN-DEXAMETHASONE 0.3-0.1 % OP OINT
TOPICAL_OINTMENT | OPHTHALMIC | Status: AC
  Filled 2014-12-21: qty 3.5

## 2014-12-21 MED ORDER — EPINEPHRINE HCL 1 MG/ML IJ SOLN
INTRAMUSCULAR | Status: AC
  Filled 2014-12-21: qty 1

## 2014-12-21 MED ORDER — HYALURONIDASE HUMAN 150 UNIT/ML IJ SOLN
INTRAMUSCULAR | Status: AC
  Filled 2014-12-21: qty 1

## 2014-12-21 MED ORDER — PROPOFOL 10 MG/ML IV BOLUS
INTRAVENOUS | Status: AC
  Filled 2014-12-21: qty 20

## 2014-12-21 MED ORDER — KETOROLAC TROMETHAMINE 0.5 % OP SOLN
1.0000 [drp] | OPHTHALMIC | Status: AC
  Administered 2014-12-21 (×2): 1 [drp] via OPHTHALMIC
  Filled 2014-12-21: qty 5

## 2014-12-21 MED ORDER — LIDOCAINE-EPINEPHRINE 2 %-1:100000 IJ SOLN
INTRAMUSCULAR | Status: AC
  Filled 2014-12-21: qty 1

## 2014-12-21 MED ORDER — PILOCARPINE HCL 4 % OP SOLN
OPHTHALMIC | Status: AC
  Filled 2014-12-21: qty 15

## 2014-12-21 MED ORDER — ACETYLCHOLINE CHLORIDE 20 MG IO SOLR
INTRAOCULAR | Status: AC
  Filled 2014-12-21: qty 1

## 2014-12-21 MED ORDER — SODIUM CHLORIDE 0.9 % IV SOLN
INTRAVENOUS | Status: DC
  Administered 2014-12-21: 09:00:00 via INTRAVENOUS

## 2014-12-21 MED ORDER — SODIUM CHLORIDE (HYPERTONIC) 5 % OP SOLN
1.0000 [drp] | OPHTHALMIC | Status: AC
  Administered 2014-12-21: 2 [drp] via OPHTHALMIC
  Filled 2014-12-21: qty 15

## 2014-12-21 MED ORDER — TROPICAMIDE 1 % OP SOLN
1.0000 [drp] | OPHTHALMIC | Status: DC | PRN
  Administered 2014-12-21 (×3): 1 [drp] via OPHTHALMIC
  Filled 2014-12-21: qty 3

## 2014-12-21 MED ORDER — FENTANYL CITRATE (PF) 250 MCG/5ML IJ SOLN
INTRAMUSCULAR | Status: AC
  Filled 2014-12-21: qty 5

## 2014-12-21 MED ORDER — NA CHONDROIT SULF-NA HYALURON 40-30 MG/ML IO SOLN
INTRAOCULAR | Status: AC
  Filled 2014-12-21: qty 0.5

## 2014-12-21 MED ORDER — CYCLOPENTOLATE HCL 1 % OP SOLN: 1.0000 [drp] | OPHTHALMIC | Status: AC

## 2014-12-21 MED ORDER — FENTANYL CITRATE (PF) 100 MCG/2ML IJ SOLN
INTRAMUSCULAR | Status: DC | PRN
  Administered 2014-12-21: 25 ug via INTRAVENOUS

## 2014-12-21 MED ORDER — BSS IO SOLN
INTRAOCULAR | Status: AC
  Filled 2014-12-21: qty 15

## 2014-12-21 MED ORDER — GATIFLOXACIN 0.5 % OP SOLN
1.0000 [drp] | OPHTHALMIC | Status: DC | PRN
  Administered 2014-12-21 (×3): 1 [drp] via OPHTHALMIC
  Filled 2014-12-21: qty 2.5

## 2014-12-21 MED ORDER — TOBRAMYCIN 0.3 % OP OINT
TOPICAL_OINTMENT | OPHTHALMIC | Status: DC | PRN
  Administered 2014-12-21: 1 via OPHTHALMIC

## 2014-12-21 MED ORDER — HYALURONIDASE HUMAN 150 UNIT/ML IJ SOLN
INTRAMUSCULAR | Status: DC | PRN
  Administered 2014-12-21: 11:00:00 via RETROBULBAR

## 2014-12-21 MED ORDER — GATIFLOXACIN 0.5 % OP SOLN: 1.0000 [drp] | OPHTHALMIC | Status: AC

## 2014-12-21 MED ORDER — BUPIVACAINE HCL (PF) 0.75 % IJ SOLN
INTRAMUSCULAR | Status: AC
  Filled 2014-12-21: qty 10

## 2014-12-21 MED ORDER — PROPOFOL 10 MG/ML IV BOLUS
INTRAVENOUS | Status: DC | PRN
  Administered 2014-12-21: 30 mg via INTRAVENOUS

## 2014-12-21 MED ORDER — ACETYLCHOLINE CHLORIDE 20 MG IO SOLR
INTRAOCULAR | Status: DC | PRN
  Administered 2014-12-21: 10 mg via INTRAOCULAR

## 2014-12-21 MED ORDER — STERILE WATER FOR IRRIGATION IR SOLN
Status: DC | PRN
  Administered 2014-12-21: 200 mL

## 2014-12-21 MED ORDER — CARVEDILOL 12.5 MG PO TABS
ORAL_TABLET | ORAL | Status: AC
  Administered 2014-12-21: 12.5 mg
  Filled 2014-12-21: qty 1

## 2014-12-21 MED ORDER — PHENYLEPHRINE HCL 2.5 % OP SOLN
1.0000 [drp] | OPHTHALMIC | Status: AC
  Administered 2014-12-21 (×2): 1 [drp] via OPHTHALMIC
  Filled 2014-12-21: qty 2

## 2014-12-21 MED ORDER — DEXAMETHASONE SODIUM PHOSPHATE 10 MG/ML IJ SOLN
INTRAMUSCULAR | Status: AC
  Filled 2014-12-21: qty 1

## 2014-12-21 MED ORDER — ONDANSETRON HCL 4 MG/2ML IJ SOLN
INTRAMUSCULAR | Status: DC | PRN
  Administered 2014-12-21: 4 mg via INTRAVENOUS

## 2014-12-21 MED ORDER — NA CHONDROIT SULF-NA HYALURON 40-30 MG/ML IO SOLN
INTRAOCULAR | Status: DC | PRN
  Administered 2014-12-21: 0.5 mL via INTRAOCULAR

## 2014-12-21 MED ORDER — TRYPAN BLUE 0.15 % OP SOLN
0.5000 mL | OPHTHALMIC | Status: AC
  Administered 2014-12-21: 0.5 mL via INTRAVITREAL
  Filled 2014-12-21: qty 0.5

## 2014-12-21 MED ORDER — SODIUM CHLORIDE 0.9 % IV SOLN
INTRAVENOUS | Status: DC | PRN
  Administered 2014-12-21: 09:00:00 via INTRAVENOUS

## 2014-12-21 SURGICAL SUPPLY — 43 items
APL SRG 3 HI ABS STRL LF PLS (MISCELLANEOUS) ×1
APPLICATOR COTTON TIP 6IN STRL (MISCELLANEOUS) ×3 IMPLANT
APPLICATOR DR MATTHEWS STRL (MISCELLANEOUS) ×3 IMPLANT
BLADE KERATOME 2.75 (BLADE) ×2 IMPLANT
BLADE KERATOME 2.75MM (BLADE) ×1
BLADE MINI RND TIP GREEN BEAV (BLADE) IMPLANT
BLADE STAB KNIFE 45DEG (BLADE) IMPLANT
CANNULA ANTERIOR CHAMBER 27GA (MISCELLANEOUS) ×3 IMPLANT
CORDS BIPOLAR (ELECTRODE) IMPLANT
COVER MAYO STAND STRL (DRAPES) ×3 IMPLANT
DRAPE OPHTHALMIC 40X48 W POUCH (DRAPES) ×3 IMPLANT
DRAPE RETRACTOR (MISCELLANEOUS) ×3 IMPLANT
FILTER BLUE MILLIPORE (MISCELLANEOUS) IMPLANT
GLOVE BIO SURGEON STRL SZ8 (GLOVE) ×3 IMPLANT
GLOVE SURG SS PI 7.0 STRL IVOR (GLOVE) ×6 IMPLANT
GLOVE SURG SS PI 8.5 STRL IVOR (GLOVE) ×2
GLOVE SURG SS PI 8.5 STRL STRW (GLOVE) IMPLANT
GOWN STRL REUS W/ TWL LRG LVL3 (GOWN DISPOSABLE) ×2 IMPLANT
GOWN STRL REUS W/TWL LRG LVL3 (GOWN DISPOSABLE) ×6
KIT BASIN OR (CUSTOM PROCEDURE TRAY) ×3 IMPLANT
KNIFE CRESCENT 2.5 55 ANG (BLADE) IMPLANT
LENS IOL ACRYSOF IQ POST 27.0 (Intraocular Lens) ×3 IMPLANT
NDL 18GX1X1/2 (RX/OR ONLY) (NEEDLE) IMPLANT
NEEDLE 18GX1X1/2 (RX/OR ONLY) (NEEDLE) ×3 IMPLANT
NEEDLE 25GX 5/8IN NON SAFETY (NEEDLE) ×6 IMPLANT
NEEDLE FILTER BLUNT 18X 1/2SAF (NEEDLE) ×2
NEEDLE FILTER BLUNT 18X1 1/2 (NEEDLE) ×1 IMPLANT
NS IRRIG 1000ML POUR BTL (IV SOLUTION) ×3 IMPLANT
PACK CATARACT CUSTOM (CUSTOM PROCEDURE TRAY) ×3 IMPLANT
PAD ARMBOARD 7.5X6 YLW CONV (MISCELLANEOUS) ×6 IMPLANT
PAK PIK CVS CATARACT (OPHTHALMIC) ×3 IMPLANT
PROBE ANTERIOR VITRECTOR (OPHTHALMIC) IMPLANT
SPEAR EYE SURG WECK-CEL (MISCELLANEOUS) IMPLANT
SUT ETHILON 10 0 CS140 6 (SUTURE) IMPLANT
SUT SILK 4 0 C 3 735G (SUTURE) IMPLANT
SUT SILK 6 0 G 6 (SUTURE) ×3 IMPLANT
SUT VICRYL 8 0 TG140 8 (SUTURE) IMPLANT
SYR 3ML LL SCALE MARK (SYRINGE) IMPLANT
SYR TB 1ML LUER SLIP (SYRINGE) ×2 IMPLANT
TIP ABS 45DEG FLARED 0.9MM (TIP) ×3 IMPLANT
TOWEL OR 17X24 6PK STRL BLUE (TOWEL DISPOSABLE) ×6 IMPLANT
WATER STERILE IRR 1000ML POUR (IV SOLUTION) ×3 IMPLANT
WIPE INSTRUMENT VISIWIPE 73X73 (MISCELLANEOUS) ×3 IMPLANT

## 2014-12-21 NOTE — Anesthesia Postprocedure Evaluation (Signed)
  Anesthesia Post-op Note  Patient: [name]  Procedure(s) Performed: Procedure(s): CATARACT EXTRACTION EXTRACAPSULAR WITH INTRAOCULAR LENS PLACEMENT LEFT EYE (Left)  Patient Location: PACU  Anesthesia Type: MAC  Level of Consciousness: awake and alert   Airway and Oxygen Therapy: Patient Spontanous Breathing  Post-op Pain: Controlled  Post-op Assessment: Post-op Vital signs reviewed, Patient's Cardiovascular Status Stable and Respiratory Function Stable  Post-op Vital Signs: Reviewed  Filed Vitals:   12/21/14 1150  BP: 142/79  Pulse: 57  Temp: 36.6 C  Resp: 8    Complications: No apparent anesthesia complications

## 2014-12-21 NOTE — Anesthesia Procedure Notes (Signed)
Procedure Name: MAC Date/Time: 12/21/2014 10:25 AM Performed by: Garrison Columbus T Pre-anesthesia Checklist: Patient identified, Emergency Drugs available, Suction available and Patient being monitored Patient Re-evaluated:Patient Re-evaluated prior to inductionOxygen Delivery Method: Simple face mask Preoxygenation: Pre-oxygenation with 100% oxygen Intubation Type: IV induction Placement Confirmation: positive ETCO2 and breath sounds checked- equal and bilateral Dental Injury: Teeth and Oropharynx as per pre-operative assessment

## 2014-12-21 NOTE — Interval H&P Note (Signed)
History and Physical Interval Note:  12/21/2014 9:54 AM  [name]  has presented today for surgery, with the diagnosis of CATARACT LEFT EYE  The various methods of treatment have been discussed with the patient and family. After consideration of risks, benefits and other options for treatment, the patient has consented to  Procedure(s): CATARACT EXTRACTION EXTRACAPSULAR WITH INTRAOCULAR LENS PLACEMENT ([place]) (Left) as a surgical intervention .  The patient's history has been reviewed, patient examined, no change in status, stable for surgery.  I have reviewed the patient's chart and labs.  Questions were answered to the patient's satisfaction.     WHITAKER, ROY

## 2014-12-21 NOTE — H&P (View-Only) (Signed)
History & Physical:   DATE:   11-22-14  NAMEMORRILL, BOMKAMP      [id number]       HISTORY OF PRESENT ILLNESS: Referred by Dr. Claudean Kinds     Chief Eye Complaints Glaucoma  patient: Va seems to be worse. pt cannot see to eat or move around. Patient fell and hit head a few weeks ago. Patient had an abrasion on the left cheek bone, along with a broken blood vessel in OS.  BS 168mg % this am.   HPI: EYES: Reports symptoms of dim vision almost totally blind.     LOCATION:   BOTH EYES        QUALITY/COURSE:   Reports condition is worsening.        INTENSITY/SEVERITY:    Reports measurement ( or degree) as severe .      DURATION:   Reports the general length of symptoms to be years.      ONSET/TIMING:   Reports occurrence as   CONTEXT/WHEN:   Reports usually associated with   MODIFIERS/TREATMENTS:  Improved by               ACTIVE PROBLEMS: Total mature senile cataract   ICD10: H25.89  ICD9: 366.17  Onset: 09/20/2014 16:25  Initial Date:     Serous choroidal detachment, left eye   ICD10: H31.422  ICD9:   NO VIEW PROBABLY RESOLVED   Primary open-angle glaucoma, severe stage   ICD10: H40.11X3  ICD9:   Onset: 12/29/2013 12:01  stable OS Blindness, one eye, low vision other eye   ICD10: H54.10  ICD9: 369.10  Onset: 02/01/2014 13:23  Initial Date:    Hypertension   ICD10:   ICD9: 401.9  Onset: 04/26/2013 09:50  Initial Date:    Diabetes - Type 2 - unspecified   ICD10: E11.9  ICD9: 250.00  Onset: 04/26/2013 09:50  Initial Date:    Posterior vitreous detachment   ICD10: H43.819  ICD9: 379.21  Onset: 04/26/2013 12:09  Initial Date: 09/20/2013  09:32  SURGERIES: Stroke 2008, Jan 2016  Express Shunt   OS 08/18/2013 Pick List - Surgeries SLT 360 OS  06/17/2013 14:25  MEDICATIONS: Lantus: Strength-   SIG-  Dose-  Freq-   12 units  Amlodipine (Norvasc):   10 mg tablet  SIG-  1 each   once a day   Plavix (Clopidogrel):  75 mg tablet  SIG-  1 tab(s)   once a day   Flomax  (Tamsulosin):   0.4 mg capsule  SIG-  2 cap(s)   once a day   Coreg (Carvedilol):   12.5 mg tablet  SIG-  1 tab(s)   2 times a day   Senna (Senokot, Ex-Lax):    8.6 mg tablet    SIG-    1 or 2     once a day (at bedtime) as needed for constipation               Lipitor:    80 mg tablet   SIG-  1 each     once a day                  Synthroid (Levothyroxine): 25 mcg (0.025 mg) tablet  SIG-  1 each   once a day   Norco  (APAP/Hydrocodone):   325 mg-5 mg tablet SIG-  1 tab(s)   every 4 hours  Furosemide (Lasix):   40 mg tablet  SIG-  3 tab(s)    in the AM    Multivitamin: Strength-  SIG-  Dose-  Freq-    Aspirin:  Strength-    SIG-  1 each   once a day   Lumigan: 0.01% solution SIG-  1 DROP IN OD QHS    Humalog (Insulin Lispro):  100 units/mL (injection)  SIG-   as directed    as directed   sliding scale  Pred Forte: acetate 1% suspension SIG-  1 gtt in each affected eye 4 times a day for 5 days   Atropine Sulfate, Ophthalmic: 1% solution SIG-  1 DROP IN OS BID   Ciloxan (Ciprofloxacin) Solution: 0.3% solution SIG-  1 drop in OS BID   Zymaxid: 0.5% solution SIG-  1 drop in OS BID   Ciloxan (Ciprofloxacin) Solution: 0.3% solution SIG-  1 drop in OS BID  REVIEW OF SYSTEMS: ROS:   GEN- Constitutional: HENT: GEN - Endocrine: Reports symptoms of diabetes.    LUNGS/Respiratory:  HEART/Cardiovascular: Reports symptoms of hypertension.    ABD/Gastrointestinal:  Musculoskeletal (BJE): NEURO/Neurological: stroke left side  PSYCH/Psychiatric:    Is the pt oriented to time, place, person? yes Mood  normal   TOBACCO: Never smoker   ICD10: X10.626 ICD9: V13.89 Onset: 04/26/2013 09:52 Initial Date:   Tobacco use:     Tobacco cessation:          Smoker Status:   Never smoker   ICD10: Z87.898 ICD9: V13.89 Onset: 04/26/2013 09:52 Initial Date:  SOCIAL HISTORY: Starter Pick List - Social History  Retired [employer]  FAMILY HISTORY: Family History - 1st Degree Relatives:  Mother dead.  ALLERGIES: Drug Allergies.  No Known.  PHYSICAL EXAMINATION: VS: BMI: 24.2.  BP: 136/65.  H: 65.00 in.  P: 90 /min.  W: 145lbs 0oz.    Va06/28/2016 16:03  OD:[place] 20/NLP OS [place] 20/CF PH NI  EYEGLASSES: several yrs old (left at home) OD -0.25 +0.25 x005                  OS +4.00 +2.00 x111  ADD:   Auto Refraction: 08/25/2013 10:12  OD: defer OS: +2.50 +2.00 x127   MR 10/26/2013 10:07  OD: defer OS: +2.25 +1.75 x137 20/50  after liquigel ADD  K's OD: 41.75 axis 104       45.00  axis 014 OS: 42.25   axis 015      44.75  axis 105   VF: OD: Loss of vision in all four quadrants OS: decreased vision in all four quadrants  Motility:  orthophoria and full  PUPILS: +2 afferent pupillary defect OD, dilated 53mm non reactive OS  EYELIDS & OCULAR ADNEXA normal  SLE: Conjunctiva:pinguecula each eye quiet, -leak, bleb more difussed negative leak    filter 180 degrees OS   Cornea:  arcus each eye, 3-4+ central staining, cl in good position OS   Anterior Chamber: OS shallow w/iris touch 1CT over lens   tube in good position      Iris: brown  Lens: +3 nuclear sclerosis each eye 3+ psc OS, MATURE WHITE CATARACT OS  Vitreous: posterior vitreous detachment    CCT:  Ta   in mmHg    OD    OS 13 Time06/28/2016 16:20   Gonio: angle looks closed several areas, partial tube occlusion   Dilation:  phenylephrine 2.5% trop 1% cyclop 1%  Fundus:  optic nerve   OD: Rim loss                                                   OV:FIEP temporal w/small nerve    Macula       OD: Dull light reflex  OS: Dull light reflex   Vessels:narrow  Periphery: drusen OU, corital infusion Os    Exam: GENERAL: Appearance: HEAD, EARS, NOSE AND THROAT: Ears-Nose (external) Inspection: Externally, nose and ears are normal in appearance and without scars, lesions, or nodules.      Hearing assessment shows no problems  with normal conversation.      LUNGS and RESPIRATORY: Lung auscultation elicits no wheezing, rhonci, rales or rubs and with equal breath sounds.    Respiratory effort described as breathing is unlabored and chest movement is symmetrical.    HEART (Cardiovascular): Heart auscultation discovers regular rate and rhythm; no murmur, gallop or rub. Normal heart sounds.    ABDOMEN (Gastrointestinal): Mass/Tenderness Exam: Neither are present.     MUSCULOSKELETAL (BJE): Inspection-Palpation: No major bone, joint, tendon, or muscle changes.      NEUROLOGICAL: Alert and oriented. No major deficits of coordination or sensation.      PSYCHIATRIC: Insight and judgment appear  both to be intact and appropriate.    Mood and affect are described as normal mood and full affect.    SKIN: Skin Inspection: No rashes or lesions  ADMITTING DIAGNOSIS: Total mature senile cataract   ICD10: H25.89  ICD9: 366.17  Onset: 09/20/2014 16:25  Initial Date:     Serous choroidal detachment, left eye   ICD10: H31.422  ICD9:   NO VIEW PROBABLY RESOLVED   Primary open-angle glaucoma, severe stage   ICD10: H40.11X3  ICD9:   stable status post express tube OS Blindness, one eye, low vision other eye   ICD10: H54.10  ICD9: 369.10   Hypertension   ICD10:   ICD9: 401.9  Onset: 04/26/2013 09:50  Initial Date:    Diabetes - Type 2 - unspecified   ICD10: E11.9  ICD9: 250.00   Posterior vitreous detachment   ICD10: H43.819  ICD9: 379.21  Onset: 04/26/2013 12:09   H26.20 COMPLEX CATARACT  SURGICAL TREATMENT PLAN: ciloxan (ciprofloxacin) (tan top) twice a day in the left eye the day before surgery             Zymaxxid twice a day in the left eye the day before surgery  review head ct scan from [place]   APPLY TO TRYPAN BLUE  phaco emulsion cataract extraction  intraocular lens implant  OS may require ECCE due to density of cataract   Risk and benefits of surgery have been reviewed with the patient and the patient agrees to proceed  with the surgical procedure.    needs clearance from Dr Jeanie Cooks  continue atropine twice a day  start using pred forte once a day  LUMIGAN 1 DROP IN RIGHT EYE AT BEDTIME      Actions:     Handouts: Hypertension, glaucoma , what is glaucoma?, glaucoma treatment, Hypertension, glaucoma , what is glaucoma?, glaucoma treatment, Script Guide - Lumigan, Script Guide - Lumigan.    ___________________________ [name], Jr. Starter - Inactive Problems: Cerebrovascular accident (CVA or stroke]   ICD10: I63.9  ICD9: 436  Onset: 04/11/2014  17:24  Initial  Date:   Resolved: 08/16/2014  17:24   Stroke (Right Side) High Cholesterol

## 2014-12-21 NOTE — Discharge Instructions (Signed)
The patient may remove the eye patch today at 1:30 PM and applied the eyedrops given to him at [place] office. The patient should sleep with the plastic she'll over his eye. Sleep on his back or right side. Avoid heavy lifting bending or straining do not press on the eye.

## 2014-12-21 NOTE — Transfer of Care (Signed)
Immediate Anesthesia Transfer of Care Note  Patient: [name]  Procedure(s) Performed: Procedure(s): CATARACT EXTRACTION EXTRACAPSULAR WITH INTRAOCULAR LENS PLACEMENT LEFT EYE (Left)  Patient Location: PACU  Anesthesia Type:MAC  Level of Consciousness: awake, alert  and oriented  Airway & Oxygen Therapy: Patient Spontanous Breathing and Patient connected to nasal cannula oxygen  Post-op Assessment: Report given to RN and Post -op Vital signs reviewed and stable  Post vital signs: Reviewed and stable  Last Vitals:  Filed Vitals:   12/21/14 0903  BP: 171/90  Pulse: 65  Temp: 36.4 C  Resp: 16    Complications: No apparent anesthesia complications

## 2014-12-21 NOTE — Interval H&P Note (Signed)
History and Physical Interval Note:  12/21/2014 9:52 AM  [name]  has presented today for surgery, with the diagnosis of CATARACT LEFT EYE  The various methods of treatment have been discussed with the patient and family. After consideration of risks, benefits and other options for treatment, the patient has consented to  Procedure(s): CATARACT EXTRACTION EXTRACAPSULAR WITH INTRAOCULAR LENS PLACEMENT ([place]) (Left) as a surgical intervention .  The patient's history has been reviewed, patient examined, no change in status, stable for surgery.  I have reviewed the patient's chart and labs.  Questions were answered to the patient's satisfaction.     WHITAKER, ROY

## 2014-12-21 NOTE — Op Note (Signed)
Preoperative diagnosis: Hypermature complex cataract left eye following previous glaucoma surgery left eye Postop diagnosis: Same Procedure: Phacoemulsification with intraocular lens implant with acquiring omidria to maintain pupillary dilation Anesthesia: 2% Xylocaine with epinephrine in a 50-50 mixture 0.75% Marcaine with ample Wydase Combinations: None Assistant: Unknown Foley Procedure: The patient was transported to the operating room where he was given a peribulbar block with the aforementioned local anesthesia agent. following this the patient's face is prepped and draped in the usual sterile fashion. With the surgeon sitting temporally and the operating microscope in position a Weck-Cel sponges used to fixate the globe and a paracentesis tract was formed at the 5:00 position to into the anterior chamber the omidria diluted solution was injected into the anterior chamber the pupil measured 6 mm. All in this a air was injected in the anterior chamber and trypan blue was then injected in the anterior chamber to stain the white anterior capsule. All in this the trypan blue was irrigated from the eye and Viscoat was injected in the chamber it was necessary to place a 10-0 nylon suture at the paracentesis tract to prevent excessive leakage. An additional Weck-Cel sponge was then used to fixate the globe and a 2.75 mm keratome blade was used in a stepwise fashion through temporal cornea to into the eye and additional Viscoat was injected it was noted that the white capsule had been appropriately stained with the trypan blue therefore bent 25-gauge needle was used to incise anterior capsule and a continuous tear curvilinear capsulorrhexis was formed. The capsule forceps were used to remove the anterior capsule BSS was used to gently irrigate the nucleus was noted to rotate and the capsular bag the phacoemulsification unit was then used to sculpt the nucleus the nucleus was very dense central sculpting took place  forming a central trough using the snapper hook the nucleus was divided the nucleus was rotated and again using the snapper hook the nucleus was divided the patient violently moved his head at this point all instruments were removed from the eye. The eye was reexamined at this point the visibility was more difficult the cornea was more cloudy with excessive cortical material anterior chamber which was aspirated. A fake emulsification unit was then used to remove a small nuclear fragment the nucleus was then divided rotated in the eye a large Nucletron was then removed and after all nuclear fragments had been removed the irrigation-aspiration device was used to remove remaining cortex on the eye. Posterior capsule was intact and remain clear. Provisc was injected in the anterior chamber the intraocular lens implant was examined and noted to have no defects the lens was an Alcon AcrySof SN 60 WF IQ lens 27.0 diopter  SN number [id number] the lens injector and injected in the eye and rotated into position using a Kuglen hook following this the eye was pressurized a 10-0 nylon suture was placed at the main incision there being no leakage all instruments were removed from the eye. Topical Muro 128 5% rock's were applied to the eye and topical Tobradex ointment was applied to the eye. A patch and Fox U were placed and the patient returned to recovery area stable condition [name] Junior M.D.

## 2014-12-21 NOTE — Anesthesia Preprocedure Evaluation (Addendum)
Anesthesia Evaluation  Patient identified by MRN, date of birth, ID band Patient awake    Reviewed: Allergy & Precautions, H&P , NPO status , Patient's Chart, lab work & pertinent test results, reviewed documented beta blocker date and time   Airway Mallampati: I  TM Distance: >3 FB Neck ROM: Full    Dental no notable dental hx. (+) Teeth Intact, Dental Advisory Given   Pulmonary neg pulmonary ROS,    Pulmonary exam normal breath sounds clear to auscultation       Cardiovascular hypertension, Pt. on medications and Pt. on home beta blockers + Peripheral Vascular Disease and +CHF   Rhythm:Regular Rate:Normal     Neuro/Psych  Headaches, Depression CVA, Residual Symptoms    GI/Hepatic GERD  Medicated,(+) Hepatitis -, C  Endo/Other  diabetes, Insulin DependentHypothyroidism   Renal/GU Renal InsufficiencyRenal disease  negative genitourinary   Musculoskeletal   Abdominal   Peds  Hematology negative hematology ROS (+)   Anesthesia Other Findings   Reproductive/Obstetrics negative OB ROS                           Anesthesia Physical Anesthesia Plan  ASA: III  Anesthesia Plan: MAC   Post-op Pain Management:    Induction: Intravenous  Airway Management Planned: Nasal Cannula  Additional Equipment:   Intra-op Plan:   Post-operative Plan:   Informed Consent: I have reviewed the patients History and Physical, chart, labs and discussed the procedure including the risks, benefits and alternatives for the proposed anesthesia with the patient or authorized representative who has indicated his/her understanding and acceptance.   Dental advisory given  Plan Discussed with: CRNA  Anesthesia Plan Comments:         Anesthesia Quick Evaluation

## 2014-12-21 NOTE — H&P (View-Only) (Signed)
History & Physical:   DATE:   11-22-14  NAME:  [name], [name]      [id number]       HISTORY OF PRESENT ILLNESS: Referred by Dr. Claudean [name]     Chief Eye Complaints Glaucoma  patient: Va seems to be worse. Patient fell and hit head a few weeks ago. Patient had an abrasion on the left cheek bone, along with a broken blood vessel in OS.  BS 168mg % this am.   HPI: EYES: Reports symptoms of dim vision only seeing eye.     LOCATION:   BOTH EYES        QUALITY/COURSE:   Reports condition is worsening.        INTENSITY/SEVERITY:    Reports measurement ( or degree) as severe .      DURATION:   Reports the general length of symptoms to be years.               ACTIVE PROBLEMS: Total mature senile cataract   ICD10: H25.89  ICD9: 366.17  Onset: 09/20/2014 16:25  Initial Date:      Primary open-angle glaucoma, severe stage   ICD10: H40.11X3  ICD9:   Onset: 12/29/2013 12:01  stable OS Blindness, one eye, low vision other eye   ICD10: H54.10  ICD9: 369.10  Onset: 02/01/2014 13:23  Initial Date:    Hypertension   ICD10:   ICD9: 401.9  Onset: 04/26/2013 09:50  Initial Date:    Diabetes - Type 2 - unspecified   ICD10: E11.9  ICD9: 250.00  Onset: 04/26/2013 09:50  Initial Date:    Posterior vitreous detachment   ICD10: H43.819  ICD9: 379.21  Onset: 04/26/2013 12:09  Initial Date: 09/20/2013  09:32  SURGERIES: Stroke 2008, Jan 2016  Express Shunt   OS 08/18/2013 Pick List - Surgeries SLT 360 OS  06/17/2013 14:25  MEDICATIONS: Lantus: Strength-   SIG-  Dose-  Freq-   12 units  Amlodipine (Norvasc):   10 mg tablet  SIG-  1 each   once a day   Plavix (Clopidogrel):  75 mg tablet  SIG-  1 tab(s)   once a day   Flomax (Tamsulosin):   0.4 mg capsule  SIG-  2 cap(s)   once a day   Coreg (Carvedilol):   12.5 mg tablet  SIG-  1 tab(s)   2 times a day   Senna (Senokot, Ex-Lax):    8.6 mg tablet    SIG-    1 or 2     once a day (at bedtime) as needed for constipation                Lipitor:    80 mg tablet   SIG-  1 each     once a day                  Synthroid (Levothyroxine): 25 mcg (0.025 mg) tablet  SIG-  1 each   once a day   Norco  (APAP/Hydrocodone):   325 mg-5 mg tablet SIG-  1 tab(s)   every 4 hours    Furosemide (Lasix):   40 mg tablet  SIG-  3 tab(s)    in the AM    Multivitamin: Strength-  SIG-  Dose-  Freq-    Aspirin:  Strength-    SIG-  1 each   once a day  Lumigan: 0.01% solution SIG-  1 DROP IN OD QHS    Humalog (Insulin Lispro):  100 units/mL (injection)  SIG-   as directed    as directed   sliding scale  Pred Forte: acetate 1% suspension SIG-  1 gtt in each affected eye 4 times a day for 5 days   Atropine Sulfate, Ophthalmic: 1% solution SIG-  1 DROP IN OS BID   Ciloxan (Ciprofloxacin) Solution: 0.3% solution SIG-  1 drop in OS BID   Zymaxid: 0.5% solution SIG-  1 drop in OS BID   Ciloxan (Ciprofloxacin) Solution: 0.3% solution SIG-  1 drop in OS BID   Zymaxid: 0.5% solution SIG-  1 drop in OS BID   Atropine Sulfate, Ophthalmic: 1% solution SIG-  1 DROP IN OS BID   Pred Forte: acetate 1% suspension SIG-  1 gtt in each affected eye 4 times a day for 5 days   Lumigan: 0.01% solution SIG-  1 DROP IN OD QHS  REVIEW OF SYSTEMS: not foundROS:   GEN- Constitutional: HENT: GEN - Endocrine: Reports symptoms of diabetes.    LUNGS/Respiratory:  HEART/Cardiovascular: Reports symptoms of hypertension.    ABD/Gastrointestinal:  Musculoskeletal (BJE): wheel chair bound  NEURO/Neurological: stroke left side  PSYCH/Psychiatric:    Is the pt oriented to time, place, person? 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  ALLERGIES: Drug Allergies.  No Known.  PHYSICAL EXAMINATION: VS: BMI: 24.2.  BP: 136/65.  H: 65.00 in.  P: 90 /min.  W: 145lbs 0oz.    Va06/28/2016 16:03  OD:[place] 20/NLP OS [place] 20/CF PH NI  EYEGLASSES: several yrs old (left at home) OD -0.25 +0.25 x005                  OS +4.00 +2.00 x111  ADD:   Auto Refraction: 08/25/2013 10:12  OD: defer OS: +2.50 +2.00 x127   MR 10/26/2013 10:07  OD: defer OS: +2.25 +1.75 x137 20/50  after liquigel ADD  K's OD: 41.75 axis 104       45.00  axis 014 OS: 42.25   axis 015      44.75  axis 105   VF: OD: Loss of vision in all four quadrants OS: decreased vision in all four quadrants  Motility:  orthophoria and full  PUPILS: +2 afferent pupillary defect OD, dilated 7mm non reactive OS  EYELIDS & OCULAR ADNEXA normal  SLE: Conjunctiva:pinguecula each eye quiet, -leak, bleb more difussed negative leak    filter 180 degrees OS   Cornea:  arcus each eye, 3-4+ central staining, cl in good position OS   Anterior Chamber: OS shallow w/iris touch 1CT over lens   tube in good position      Iris: brown  Lens: +3 nuclear sclerosis each eye 3+ psc OS, MATURE WHITE CATARACT OS  Vitreous: posterior vitreous detachment    Ta   in mmHg    OD    OS 13 Time06/28/2016 16:20   Gonio: angle looks closed several areas, partial tube occlusion   Dilation:  phenylephrine 2.5% trop 1% cyclop 1%   Fundus:  optic nerve  OD: Rim loss                                                  EZ:MOQH temporal w/small nerve    Macula      OD: Dull light reflex   OS: Dull light reflex   Vessels:narrow  Periphery: drusen OU, corital infusion Os   Exam: GENERAL: Appearance: HEAD, EARS, NOSE AND THROAT: Ears-Nose (external) Inspection: Externally, nose and ears are normal in appearance and without scars, lesions, or nodules.      Hearing assessment shows no problems with normal conversation.      LUNGS and RESPIRATORY: Lung  auscultation elicits no wheezing, rhonci, rales or rubs and with equal breath sounds.    Respiratory effort described as breathing is unlabored and chest movement is symmetrical.    HEART (Cardiovascular): Heart auscultation discovers regular rate and rhythm; no murmur, gallop or rub. Normal heart sounds.    ABDOMEN (Gastrointestinal): Mass/Tenderness Exam: Neither are present.     MUSCULOSKELETAL (BJE): Inspection-Palpation: No major bone, joint, tendon, or muscle changes.      NEUROLOGICAL: Alert and oriented. No major deficits of coordination or sensation.      PSYCHIATRIC: Insight and judgment appear  both to be intact and appropriate.    Mood and affect are described as normal mood and full affect.    SKIN: Skin Inspection: No rashes or lesions  ADMITTING DIAGNOSIS: Total mature senile cataract   ICD10: H25.89  ICD9: 366.17  Onset: 09/20/2014 16:25  Initial Date:     Serous choroidal detachment, left eye   ICD10: H31.422  ICD9:   NO VIEW PROBABLY RESOLVED   Primary open-angle glaucoma, severe stage   ICD10: H40.11X3  ICD9:   stable status post express tube OS Blindness, one eye, low vision other eye   ICD10: H54.10  ICD9: 369.10   Hypertension   ICD10:   ICD9: 401.9  Onset: 04/26/2013 09:50  Initial Date:    Diabetes - Type 2 - unspecified   ICD10: E11.9  ICD9: 250.00   Posterior vitreous detachment   ICD10: H43.819  ICD9: 379.21  Onset: 04/26/2013 12:09   H26.20 COMPLEX CATARACT  SURGICAL TREATMENT PLAN: ciloxan (ciprofloxacin) (tan top) twice a day in the left eye the day before surgery             Zymaxxid twice a day in the left eye the day before surgery  review head ct scan from [place]  APPLY TO TRYPAN BLUE  phaco emulsion cataract extraction  intraocular lens implant  OS may require ECCE due to density of cataract   Risk and benefits of surgery have been reviewed with the patient and the patient agrees to proceed with the surgical procedure.    needs clearance from Dr  Jeanie Cooks  continue atropine twice a day  start using pred forte once a day  LUMIGAN 1 DROP IN RIGHT EYE AT BEDTIME    ___________________________ [name], Jr. Starter - Inactive Problems: Cerebrovascular accident (CVA or stroke]   ICD10: I63.9  ICD9: 436  Onset: 04/11/2014  17:24  Initial Date:   Resolved: 08/16/2014  17:24   Stroke (Right Side) High Cholesterol                   History & Physical:   DATE:   11-22-14  [name], [name]      [id number]       HISTORY OF PRESENT ILLNESS: Referred by Dr. Claudean [name]     Chief Eye Complaints Glaucoma  patient: Va seems to be worse. Patient fell and hit head a few weeks ago. Patient had an abrasion on the left cheek bone, along with a broken blood vessel in OS.  BS 168mg % this am.   HPI: EYES: Reports symptoms of dim vision only seeing eye.     LOCATION:   BOTH EYES        QUALITY/COURSE:   Reports condition is worsening.        INTENSITY/SEVERITY:    Reports measurement ( or degree) as severe .      DURATION:   Reports the general length of symptoms to be years.               ACTIVE PROBLEMS: Total mature senile cataract   ICD10: H25.89  ICD9: 366.17  Onset: 09/20/2014 16:25  Initial Date:      Primary open-angle glaucoma, severe stage   ICD10: H40.11X3  ICD9:   Onset: 12/29/2013 12:01  stable OS Blindness, one eye, low vision other eye   ICD10: H54.10  ICD9: 369.10  Onset: 02/01/2014 13:23  Initial Date:    Hypertension   ICD10:   ICD9: 401.9  Onset: 04/26/2013 09:50  Initial Date:    Diabetes - Type 2 - unspecified   ICD10: E11.9  ICD9: 250.00  Onset: 04/26/2013 09:50  Initial Date:    Posterior vitreous detachment   ICD10: H43.819  ICD9: 379.21  Onset: 04/26/2013 12:09  Initial Date: 09/20/2013  09:32  SURGERIES: Stroke 2008, Jan 2016  Express Shunt   OS 08/18/2013 Pick List - Surgeries SLT 360 OS  06/17/2013 14:25  MEDICATIONS: Lantus: Strength-   SIG-  Dose-  Freq-   12 units  Amlodipine (Norvasc):   10 mg  tablet  SIG-  1 each   once a day   Plavix (Clopidogrel):  75 mg tablet  SIG-  1 tab(s)   once a day   Flomax (Tamsulosin):   0.4 mg capsule  SIG-  2 cap(s)   once a day   Coreg (Carvedilol):   12.5 mg tablet  SIG-  1 tab(s)   2 times a day   Senna (Senokot, Ex-Lax):    8.6 mg tablet    SIG-    1 or 2     once a day (at bedtime) as needed for constipation               Lipitor:    80 mg tablet   SIG-  1 each     once a day                  Synthroid (Levothyroxine): 25 mcg (0.025 mg) tablet  SIG-  1 each   once a day   Norco  (APAP/Hydrocodone):   325 mg-5 mg tablet SIG-  1 tab(s)   every 4 hours    Furosemide (Lasix):   40 mg tablet  SIG-  3 tab(s)    in the AM    Multivitamin: Strength-  SIG-  Dose-  Freq-    Aspirin:  Strength-    SIG-  1 each   once a day   Lumigan: 0.01% solution SIG-  1 DROP IN OD QHS    Humalog (Insulin Lispro):  100 units/mL (injection)  SIG-   as directed  as directed   sliding scale  Pred Forte: acetate 1% suspension SIG-  1 gtt in each affected eye 4 times a day for 5 days   Atropine Sulfate, Ophthalmic: 1% solution SIG-  1 DROP IN OS BID   Ciloxan (Ciprofloxacin) Solution: 0.3% solution SIG-  1 drop in OS BID   Zymaxid: 0.5% solution SIG-  1 drop in OS BID   Ciloxan (Ciprofloxacin) Solution: 0.3% solution SIG-  1 drop in OS BID   Zymaxid: 0.5% solution SIG-  1 drop in OS BID   Atropine Sulfate, Ophthalmic: 1% solution SIG-  1 DROP IN OS BID   Pred Forte: acetate 1% suspension SIG-  1 gtt in each affected eye 4 times a day for 5 days   Lumigan: 0.01% solution SIG-  1 DROP IN OD QHS  REVIEW OF SYSTEMS: not foundROS:   GEN- Constitutional: HENT: GEN - Endocrine: Reports symptoms of diabetes.    LUNGS/Respiratory:  HEART/Cardiovascular: Reports symptoms of hypertension.    ABD/Gastrointestinal:  Musculoskeletal (BJE): wheel chair bound  NEURO/Neurological: stroke left side  PSYCH/Psychiatric:    Is the pt oriented to time, place,  person? 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  ALLERGIES: Drug Allergies.  No Known.  PHYSICAL EXAMINATION: VS: BMI: 24.2.  BP: 136/65.  H: 65.00 in.  P: 90 /min.  W: 145lbs 0oz.    Va06/28/2016 16:03  OD:[place] 20/NLP OS [place] 20/CF PH NI  EYEGLASSES: several yrs old (left at home) OD -0.25 +0.25 x005                  OS +4.00 +2.00 x111  ADD:   Auto Refraction: 08/25/2013 10:12  OD: defer OS: +2.50 +2.00 x127   MR 10/26/2013 10:07  OD: defer OS: +2.25 +1.75 x137 20/50  after liquigel ADD  K's OD: 41.75 axis 104       45.00  axis 014 OS: 42.25   axis 015      44.75  axis 105   VF: OD: Loss of vision in all four quadrants OS: decreased vision in all four quadrants  Motility:  orthophoria and full  PUPILS: +2 afferent pupillary defect OD, dilated 63mm non reactive OS  EYELIDS & OCULAR ADNEXA normal  SLE: Conjunctiva:pinguecula each eye quiet, -leak, bleb more difussed negative leak    filter 180 degrees OS   Cornea:  arcus each eye, 3-4+ central staining, cl in good position OS   Anterior Chamber: OS shallow w/iris touch 1CT over lens   tube in good position      Iris: brown  Lens: +3 nuclear sclerosis each eye 3+ psc OS, MATURE WHITE CATARACT OS  Vitreous: posterior vitreous detachment    Ta   in mmHg    OD    OS 13 Time06/28/2016 16:20   Gonio: angle looks closed several areas, partial tube occlusion   Dilation:  phenylephrine 2.5% trop 1% cyclop 1%   Fundus:  optic nerve  OD: Rim loss  UR:KYHC temporal w/small nerve    Macula      OD: Dull light reflex   OS: Dull  light reflex   Vessels:narrow  Periphery: drusen OU, corital infusion Os   Exam: GENERAL: Appearance: HEAD, EARS, NOSE AND THROAT: Ears-Nose (external) Inspection: Externally, nose and ears are normal in appearance and without scars, lesions, or nodules.      Hearing assessment shows no problems with normal conversation.      LUNGS and RESPIRATORY: Lung auscultation elicits no wheezing, rhonci, rales or rubs and with equal breath sounds.    Respiratory effort described as breathing is unlabored and chest movement is symmetrical.    HEART (Cardiovascular): Heart auscultation discovers regular rate and rhythm; no murmur, gallop or rub. Normal heart sounds.    ABDOMEN (Gastrointestinal): Mass/Tenderness Exam: Neither are present.     MUSCULOSKELETAL (BJE): Inspection-Palpation: No major bone, joint, tendon, or muscle changes.      NEUROLOGICAL: Alert and oriented. No major deficits of coordination or sensation.      PSYCHIATRIC: Insight and judgment appear  both to be intact and appropriate.    Mood and affect are described as normal mood and full affect.    SKIN: Skin Inspection: No rashes or lesions  ADMITTING DIAGNOSIS: Total mature senile cataract   ICD10: H25.89  ICD9: 366.17  Onset: 09/20/2014 16:25  Initial Date:     Serous choroidal detachment, left eye   ICD10: H31.422  ICD9:   NO VIEW PROBABLY RESOLVED   Primary open-angle glaucoma, severe stage   ICD10: H40.11X3  ICD9:   stable status post express tube OS Blindness, one eye, low vision other eye   ICD10: H54.10  ICD9: 369.10   Hypertension   ICD10:   ICD9: 401.9  Onset: 04/26/2013 09:50  Initial Date:    Diabetes - Type 2 - unspecified   ICD10: E11.9  ICD9: 250.00   Posterior vitreous detachment   ICD10: H43.819  ICD9: 379.21  Onset: 04/26/2013 12:09   H26.20 COMPLEX CATARACT  SURGICAL TREATMENT PLAN: ciloxan (ciprofloxacin) (tan top) twice a day in the left eye the day before surgery             Zymaxxid twice a day in the  left eye the day before surgery  review head ct scan from [place]  APPLY TO TRYPAN BLUE  phaco emulsion cataract extraction  intraocular lens implant  OS may require ECCE due to density of cataract   Risk and benefits of surgery have been reviewed with the patient and the patient agrees to proceed with the surgical procedure.    needs clearance from Dr Jeanie Cooks  continue atropine twice a day  start using pred forte once a day  LUMIGAN 1 DROP IN RIGHT EYE AT BEDTIME    ___________________________ [name], Jr. Starter - Inactive Problems: Cerebrovascular accident (CVA or stroke]   ICD10: I63.9  ICD9: 436  Onset: 04/11/2014  17:24  Initial Date:   Resolved: 08/16/2014  17:24   Stroke (Right Side) High Cholesterol

## 2014-12-22 ENCOUNTER — Encounter (HOSPITAL_COMMUNITY): Payer: Self-pay | Admitting: Ophthalmology

## 2014-12-28 ENCOUNTER — Encounter (HOSPITAL_BASED_OUTPATIENT_CLINIC_OR_DEPARTMENT_OTHER): Payer: Commercial Managed Care - HMO | Attending: Surgery

## 2014-12-28 ENCOUNTER — Non-Acute Institutional Stay: Payer: Commercial Managed Care - HMO | Admitting: Family Medicine

## 2014-12-28 DIAGNOSIS — F329 Major depressive disorder, single episode, unspecified: Secondary | ICD-10-CM

## 2014-12-28 DIAGNOSIS — I10 Essential (primary) hypertension: Secondary | ICD-10-CM

## 2014-12-28 DIAGNOSIS — E785 Hyperlipidemia, unspecified: Secondary | ICD-10-CM

## 2014-12-28 DIAGNOSIS — Z593 Problems related to living in residential institution: Secondary | ICD-10-CM

## 2014-12-28 DIAGNOSIS — E1159 Type 2 diabetes mellitus with other circulatory complications: Secondary | ICD-10-CM | POA: Diagnosis not present

## 2014-12-28 DIAGNOSIS — B182 Chronic viral hepatitis C: Secondary | ICD-10-CM

## 2014-12-28 DIAGNOSIS — I420 Dilated cardiomyopathy: Secondary | ICD-10-CM

## 2014-12-28 DIAGNOSIS — H53453 Other localized visual field defect, bilateral: Secondary | ICD-10-CM

## 2014-12-28 DIAGNOSIS — F32A Depression, unspecified: Secondary | ICD-10-CM

## 2014-12-28 DIAGNOSIS — N184 Chronic kidney disease, stage 4 (severe): Secondary | ICD-10-CM

## 2014-12-28 DIAGNOSIS — I63031 Cerebral infarction due to thrombosis of right carotid artery: Secondary | ICD-10-CM

## 2014-12-28 NOTE — Assessment & Plan Note (Signed)
Stable - continue trintellix 10mg  QHS

## 2014-12-28 NOTE — Assessment & Plan Note (Signed)
Stable - continue atorvastatin 80mg  daily

## 2014-12-28 NOTE — Assessment & Plan Note (Signed)
Unclear level of control, will monitor with daily pressures for the first week - continue amlodipine 10mg  daily - continue carvedilol 12.5mg  BID

## 2014-12-28 NOTE — Assessment & Plan Note (Addendum)
Stable, occurred in 2008, significant lower extremity weakness - continue secondary prophylaxis with clopidogrel 75mg  daily and atorvastatin 80mg  daily - consult PT/OT

## 2014-12-28 NOTE — Assessment & Plan Note (Signed)
History of glaucoma and cataracts, recent phacoemulsion and lens implant - significant visual impairment - followed by optho - Dr. Venetia Maxon

## 2014-12-28 NOTE — Assessment & Plan Note (Signed)
Stable, last A1c 7.9 in June - continue lantus 15u qhs - continue humalog SSI with meals - glucose checks ACHS

## 2014-12-28 NOTE — Assessment & Plan Note (Signed)
Stable, followed by nephrology, [name] - avoid nephrotoxic agents - monitor hydration status

## 2014-12-28 NOTE — Assessment & Plan Note (Signed)
Stable, no signs of volume overload. Last EF 45-50% in January - continue carvedilol 12.5mg  BID - continue lasix 120mg  BID - no ACE/ARB with CKD

## 2014-12-28 NOTE — Progress Notes (Signed)
[facility]  Visit  Primary Care Provider: McDiarmid Location of Care: [facility] Visit Information: New admission Patient accompanied by patient Source(s) of information for visit: patient and past medical records  Chief Complaint:  Chief Complaint  Patient presents with  . Establish Care    Nursing Concerns: decubitus ulcers of heels  Nutrition Concerns: none  Wound Care Nurse Concerns: heel ulcers  HISTORY OF PRESENT ILLNESS: Patient has good insight and was able to relay that he is being placed because his wife is tired of taking care of him. He has been bedridden and dependent for most of his ADLs since a stroke in 2008.   Outpatient Encounter Prescriptions as of 12/28/2014  Medication Sig  . amLODipine (NORVASC) 10 MG tablet Take 1 tablet (10 mg total) by mouth daily.  Marland Kitchen aspirin EC 325 MG tablet Take 325 mg by mouth daily.  Marland Kitchen atorvastatin (LIPITOR) 80 MG tablet Take 1 tablet (80 mg total) by mouth daily at 6 PM.  . atropine 1 % ophthalmic solution Place 1 drop into the left eye 2 (two) times daily.   . bimatoprost (LUMIGAN) 0.01 % SOLN Place 1 drop into the right eye at bedtime.  . carvedilol (COREG) 12.5 MG tablet Take 1 tablet (12.5 mg total) by mouth 2 (two) times daily with a meal.  . clopidogrel (PLAVIX) 75 MG tablet Take 1 tablet (75 mg total) by mouth daily.  . collagenase (SANTYL) ointment Apply 1 application topically as needed (Pressure sore on back of left heel).  . furosemide (LASIX) 40 MG tablet Take 3 tablets (120 mg total) by mouth 2 (two) times daily.  Marland Kitchen gabapentin (NEURONTIN) 100 MG capsule Take 100 mg by mouth 3 (three) times daily.   Marland Kitchen HYDROcodone-acetaminophen (NORCO/VICODIN) 5-325 MG per tablet Take 1 tablet by mouth every 4 (four) hours as needed for moderate pain.  Marland Kitchen insulin glargine (LANTUS) 100 UNIT/ML injection Inject 10 Units into the skin at bedtime.  . insulin lispro (HUMALOG) 100 UNIT/ML cartridge Inject 5 Units into the  skin 3 (three) times daily with meals. Use PRN for blood sugar above 200   151-200 2 units;  201-250 3 units;  251-300 5 units;  301-350 8 units;  351-400 10 units :  BS ABOVE 400 - CALL MD  . levothyroxine (SYNTHROID, LEVOTHROID) 100 MCG tablet Take 100 mcg by mouth daily.  . Multiple Vitamins-Minerals (DECUBI-VITE PO) Take 1 tablet by mouth daily.  Marland Kitchen omeprazole (PRILOSEC) 40 MG capsule Take 40 mg by mouth daily as needed (heartburn).   Marland Kitchen OVER THE COUNTER MEDICATION Take 1 scoop by mouth 2 (two) times daily. Protein powder mixed with food/drink (25 g of protein per serving) PURE PROTEIN  . polyethylene glycol (MIRALAX / GLYCOLAX) packet Take 17 g by mouth 2 (two) times daily.  . prednisoLONE acetate (PRED FORTE) 1 % ophthalmic suspension Place 1 drop into the left eye 4 (four) times daily.  Marland Kitchen senna (SENOKOT) 8.6 MG tablet Take 2 tablets by mouth 2 (two) times daily.   . tamsulosin (FLOMAX) 0.4 MG CAPS capsule Take 0.8 mg by mouth at bedtime.   . Vortioxetine HBr (BRINTELLIX) 10 MG TABS Take 20 mg by mouth daily.   Marland Kitchen zinc gluconate 50 MG tablet Take 50 mg by mouth 2 (two) times daily.   Facility-Administered Encounter Medications as of 12/28/2014  Medication  . gatifloxacin (ZYMAXID) 0.5 % ophthalmic drops 1 drop  . phenylephrine (MYDFRIN) 2.5 % ophthalmic solution 1 drop   No Known  Allergies History Patient Active Problem List   Diagnosis Date Noted  . Person living in residential institution 12/28/2014  . Depression 12/28/2014  . HLD (hyperlipidemia) 06/16/2014  . Peripheral vision loss 06/16/2014  . 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 CKD (chronic kidney disease), stage III 05/27/2014  . Essential hypertension   . Type 2 diabetes mellitus with other circulatory complications ([place])   . Congestive dilated cardiomyopathy ([place]) 04/11/2014  . Cerebrovascular disease    Past Medical History  Diagnosis Date  . Hypertension   .  Hypercholesterolemia   . Glaucoma   . Cerebrovascular disease     a. 01/2007 MRI/A: L MCA 06-26%, R MCA 94%, LICA 85%.  . Chronic combined systolic and diastolic CHF, NYHA class 2     a. 01/2007 Echo: EF 60-65%;  b. 03/2014 Echo: EF 45-50%, Gr 1 DD, severe LVH.  Marland Kitchen Headache   . Bedridden     transfers to chair  . Pneumonia 10/2006  . Hypothyroidism   . GERD (gastroesophageal reflux disease)   . Stroke 01/2007    L corona radiata infarct; "left his right side weak" (09/16/2014)  . Stroke 03/2014    "while in hospital; left side extremely weak since" (09/16/2014)  . Hepatitis dx'd 03/2014    C  . Type II diabetes mellitus     Type II  . Depression   . CKD (chronic kidney disease), stage IV     [facility]   Past Surgical History  Procedure Laterality Date  . Colonoscopy w/ biopsies and polypectomy  2002  . Colon surgery      Polyps removed- surgically  . Trabeculectomy Left 08/18/2013    Procedure: TRABECULECTOMY WITH TUBE WITH [facility];  Surgeon: [name], MD;  Location: [facility];  Service: Ophthalmology;  Laterality: Left;  . Colonoscopy  2015  . Cataract extraction extracapsular Left 12/21/2014    Procedure: CATARACT EXTRACTION EXTRACAPSULAR WITH INTRAOCULAR LENS PLACEMENT LEFT EYE;  Surgeon: [name], MD;  Location: [facility];  Service: Ophthalmology;  Laterality: Left;   Family History  Problem Relation Age of Onset  . Family history unknown: Yes    reports that he has never smoked. He has never used smokeless tobacco. He reports that he does not drink alcohol or use illicit drugs.  Basic Activities of Daily Living   ADLs Independent Needs Assistance Dependent  Bathing   X  Dressing   X  Ambulation   X  Toileting   X  Eating  X      Instrumental Activities of Daily Living  IADL Independent Needs Assistance Dependent  Cooking   X  Housework   X  Manage Medications   X  Manage the telephone   X  Shopping for food, clothes, Meds, etc   X  Use transportation   X   Manage Finances   X    Falls in the past six months:   no  Diet:  diabetic, regular calorie  Review of Systems  Patient has ability to communicate answers to ROS: yes See HPI  Geriatric Syndromes: Constipation no ,   Incontinence yes  Dizziness no   Syncope no   Skin problems no   Visual Impairment yes   Hearing impairment yes  Eating impairment no  Impaired Memory or Cognition no   Behavioral problems no   Sleep problems no   Weight loss no    Pain: None  Dyspnea: None  General: Denies  fevers, chills, progressive fatigue, weight gain.  Eyes: Denies pain, blurred vision  Ears/Nose/Throat: Denies ear pain, throat pain, rhinorrhea, nasal congestion.  Cardiovascular: Denies chest pains, palpitations, dyspnea on exertion, orthopnea, peripheral edema.  Respiratory: Denies cough, sputum, dyspnea  Gastrointestinal: Denies abdominal pain, bloating, constipation, diarrhea.  Genitourinary: Denies dysuria, urinary frequency, discharge Musculoskeletal: Denies joint pain, swelling, weakness.  Skin: Denies skin rash or ulcers. Neurologic: Denies transient paralysis, weakness, paresthesias, headache.  Psychiatric: Denies depression, anxiety, psychosis. Endocrine: Denies weight loss   PHYSICAL EXAM:. Wt Readings from Last 3 Encounters:  12/21/14 125 lb (56.7 kg)  12/08/14 145 lb (65.772 kg)  09/17/14 126 lb 12.8 oz (57.516 kg)   Temp Readings from Last 3 Encounters:  12/21/14 97.8 F (36.6 C)   09/17/14 98.2 F (36.8 C) Oral  06/23/14 98.4 F (36.9 C) Oral   BP Readings from Last 3 Encounters:  12/21/14 142/79  12/08/14 120/70  09/17/14 149/67   Pulse Readings from Last 3 Encounters:  12/21/14 57  12/08/14 72  09/17/14 74    General: alert, cooperative, no distress, well nourished, pleasant, clean, groomed HEENT:  No scleral icterus, no nasal secretions, EACs not occluded, TMs clear bilat, [place] moist and no erythema or lesion Neck:  Supple, No JVD, no  lymphadenopathy CV:  RRR, no murmur, no ankle swelling RESP: No resp distress or accessory muscle use.  Clear to ausc bilat. No wheezing, no rales, no rhonchi.  ABD:  Soft, Non-tender, non-distended, +bowel sounds, no masses MSK:  No back pain, no joint pain.  No joint swelling or redness EXT: Warm and well perfused   no edema, no erythema, pulses WNL Gait:  Non ambulatory Skin: heel ulcer, otherwise intact Neurologic:Cranial nerves normal;  Muscle Tone within normal limits; Motor Strength: weakness of  both leg(s): entire area 2/5, RUE 5/5, LUE 4/5 Sensation: WFL by patient report; Cerebellar: no tremors noted; DTRs: 2+ bilaterally Psych:  Orientation oriented to person, place, time, and general circumstances; Judgment Good, Insight Intact Memory recent and remote memory intact; Attention Normal;  Mood appropriate; Speech normal; Language none ; Thought Coherent    No flowsheet data found. No flowsheet data found.  Assessment and Plan:   Type 2 diabetes mellitus with other circulatory complications Stable, last A1c 7.9 in June - continue lantus 15u qhs - continue humalog SSI with meals - glucose checks ACHS    Peripheral vision loss History of glaucoma and cataracts, recent phacoemulsion and lens implant - significant visual impairment - followed by optho - Dr. Venetia Maxon  HLD (hyperlipidemia) Stable - continue atorvastatin 80mg  daily  Essential hypertension Unclear level of control, will monitor with daily pressures for the first week - continue amlodipine 10mg  daily - continue carvedilol 12.5mg  BID    Congestive dilated cardiomyopathy Stable, no signs of volume overload. Last EF 45-50% in January - continue carvedilol 12.5mg  BID - continue lasix 120mg  BID - no ACE/ARB with CKD  CKD (chronic kidney disease), stage III Stable, followed by nephrology, Lorrene Reid - avoid nephrotoxic agents - monitor hydration status  Cerebral infarction due to thrombosis of right carotid  artery Stable, occurred in 2008, significant lower extremity weakness - continue secondary prophylaxis with ASA 325mg  daily and atorvastatin 80mg  daily - consult PT/OT  Depression Stable - continue trintellix 10mg  QHS   Family communications: Unable to reach wife by phone, will continue to try to make contact  Advanced Directives (MOST form, Living Will, HCPOA): unknown Code Status:  Full Emergency contact:  Wife, [place]  Follow  Up:  Next 7 days unless acute issues arise.

## 2014-12-30 ENCOUNTER — Encounter: Payer: Self-pay | Admitting: Cardiovascular Disease

## 2015-01-04 ENCOUNTER — Encounter: Payer: Self-pay | Admitting: Family Medicine

## 2015-01-04 DIAGNOSIS — B182 Chronic viral hepatitis C: Secondary | ICD-10-CM | POA: Insufficient documentation

## 2015-01-04 DIAGNOSIS — B1920 Unspecified viral hepatitis C without hepatic coma: Secondary | ICD-10-CM

## 2015-01-04 HISTORY — DX: Unspecified viral hepatitis C without hepatic coma: B19.20

## 2015-01-04 NOTE — Progress Notes (Signed)
[name], male   DOB: 06/18/1945, 69 y.o.   MRN: [id number] I have seen and examined this patient. I have reviewed labs, test and imaging results.  I have discussed with Dr Adamo.  I agree with the resident's findings, assessment and care plan as detailed in their admission note.   Information for visit obtained from EMR and patient. Patient admitted to [facility] from his home in community. I reviewed recent labs, Echo reports, actual ECG (unremarkable 12/08/14), Brain MRI image from 04/12/14  Family History: No history of stroke.  SH: Never smoked.  Wife is nurse.  Healthcare Issues   1. 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 Stable - Impairment in expressive language - hemiplegia - Complications: pressure ulcers left heel - Continue his aspirin and Plavix. It is uncertain why pt is on Dual Antiplt therapy. Dr Xu (Neuro) on hospital follow up 06/15/14 outpatient consultation recommended that patient stop Aspirin and continue Plavix.  - recommend stopping Aspirin. Continue Plavix.  2. DMT2, with circulatory complications - Established Problem. Last A1c in EMR 7.9%  - Continue Lantus with SSI Humalog for now. Check A1c. CBGs ACHS Adjust insulins as necessary - Uncertain if patient has component of diabetic neuropathy, and this is why he is on Gabapentin.  - Continue gabapentin for now.  Consider trial off in future   3. 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 He is unable to to perceive light in his right eye.  - He was unable to perceive Largest figure on [place]ens Chart with either eye (>= 200/20) - Cause presumed to be secondary to glaucoma. Pt had trabeculectomy in 2015 on left eye.  He had recent right eye cataract surgery by Dr Whitaker.   - Continue patient's multiple glaucoma ophth drops.   4. Depression - Established patient - Pt  denies depressed mood or loss of pleasure in life.  - Continue SSRI  5. Hyperlipidema - Established. Stable - Tolerating atorvastatin - Continue current statin  6. Hypothyroidism - Established - Stable.   - Check TSH and adjust levothyroxine dose as necessary  7. Lower Urinary Tract symptoms - Established problem - Stable - Continue Flomax   8. Left Heel pressure ulcer secondary to # 1 - Wound dressed by [facility] wound care nurse.    9. Hypertension - Establishjed problem - Stable. Tolerating Amlodipine and coreg. Continue current regiment.   10. Heart Failure with reduced Ejection Fraction - Echo 01/16 during CHF exac found to have severe LVH and EF 45-50%. Not candidate for coronary cath secondary to CKD.  - Established problem - Stable. Euvolemic currently - Followed by Dr Nishan ([facility]) - Continue Coreg, Lasix  11. Chronic Kidney Disease, Stage 4 Established problem - Stable - Cr 1.9 (12/21/14) with eGFR = 35 mL/min - Normal K+ and HCO3 - Pt followed by [facility] - Patient had UE vein mapping by US (05/27/14) presumably in preparation for  future hemodialysis (Dr Chen)  12. Chronic Hepatitis C Virus Infection - Established problem - Stable.  Adequate synthetic  - Pt with HCV (+) antibody and HCV RNA Quant Log = 5.87 (H) (04/10/14) - AST 98, ALT 109, TB 0.7, Alb 3.2, Alk Phos 106 (12/21/14) - Will need to discuss with patient and family if they know of the diagnosis, and whether they will want to pursue any further work-up   with a hepatologist;   13. MiniCog abnormal - Patient recalled 2 out of 3 objects at 3 minutes.  Pt with Bachelors Degree in Hospital Administration.   - Pt unable to perform Clock draw portion of test because of his significant visual impairment.  - Recommend a MocA-blind testing to assess cognition in pt at risk of dementia.    

## 2015-01-07 ENCOUNTER — Telehealth: Payer: Self-pay | Admitting: Family Medicine

## 2015-01-07 NOTE — Telephone Encounter (Signed)
  AFTER HOURS LINE  [facility] nurse, Nira Conn, calling about the patient's blood sugars. Patient's CBG was noted to be 448 around 8:30pm. She rechecked and it was 591. Per her report, he normally runs around the 150s.  His blood sugar was noted to be 254 this morning. Last night it was noted to be in the 150s. He is asymptomatic without polyuria, polydipsia; vision stable. No infectious source noted: no skin lesions, cough, fevers, abdominal pain, diarrhea, or vomiting. The patient has bee hemodynamically stable.   He is normally on Lantus 10u at bedtime, nurse did not work last night but records indicate it was given. He has sliding scale Humalog with mealtimes but not at bedtime.    No reported infectious symptoms or GI illness causing elevated blood sugars.  I am concerned the patient did not really receive his Lantus last night given his CBG was elevated this AM (when it should have been fasting). Will continue with Lantus 10 units tonight. Will add 10 units of Humalog tonight. Per nurse, they have been checking his blood sugar twice a day. I advised her in addition to add the 10 units of Humalog tonight to re-check his blood sugar at 2am, and then every 6 hours for the next 24hrs (from the last note, it looks like he should be having his CBGs checked ACHS anyways) to keep me updated.   Archie Patten, MD [facility] Resident  01/07/2015, 10:35 PM

## 2015-01-08 ENCOUNTER — Telehealth: Payer: Self-pay | Admitting: Family Medicine

## 2015-01-08 NOTE — Telephone Encounter (Signed)
AFTER HOURS LINE  Called by [facility] nurse, Nira Conn. CBG is now 211. Will continue to monitor CBGs ACHS with sliding scale Humalog (nurse states they've been doing this all along).    Archie Patten, MD [facility] Family Medicine Resident  01/08/2015, 2:25 AM

## 2015-01-09 LAB — BASIC METABOLIC PANEL
Creatinine: 1.8 mg/dL — AB (ref 0.6–1.3)
GLUCOSE: 82 mg/dL
Potassium: 3.5 mmol/L (ref 3.4–5.3)
SODIUM: 143 mmol/L (ref 137–147)

## 2015-01-09 LAB — BUN
BUN: 69 mg/dL — AB (ref 4–21)
Bicarbonate: 30

## 2015-01-12 ENCOUNTER — Encounter: Payer: Self-pay | Admitting: Pharmacist

## 2015-01-12 ENCOUNTER — Telehealth: Payer: Self-pay | Admitting: Family Medicine

## 2015-01-12 NOTE — Telephone Encounter (Addendum)
Paged to [facility] Emergency Line by [place] staff Abran Cantor), at about 2030 regarding patient Oseias Horsey. Concern reported is hyperglycemia with elevated CBGs. Known history of DM2 with last A1c 7.9 (in 08/2014). Recently his CBGs have been controlled until tonight a CBG before dinner was at 582, he was given routine SSI with Novolog coverage (6 units per report), he ate dinner, and then at time of page CBG re-checked with reading "High" (around 2 hours after dinner, suspect this means >600 based on glucometer). His current insulin regimen includes basal with Lantus 8u nightly 10pm, and SSI Novolog TID WC only, without QHS coverage. He currently feels "well" without any acute symptoms associated to his hyperglycemia. Denies any fevers, chills, nausea, vomiting, abdominal pain, HA, chest pain, SOB, mental status changes / confusion. Nursing reports vitals at BP 107/81, HR 82, RR 20, temp 98.7, O2 sat 97% on RA. Additionally noted in chart with similar event 01/07/15 with elevated CBGs 400 to 500s.  From the record, I am unclear regarding the trend in his Lantus titration, he was admitted to SNF on 12/[facility]/14 with Lantus 15u nightly, has since titrated down to 10u and now 8u nightly. At this time, given that the patient is asymptomatic and with stable vitals, I advised to adjust his current insulin regimen and monitor CBGs. Increase Lantus from 8 to 10u tonight. Give Novolog 10u PM coverage, and re-check CBG in 2 hours, page back with results or any significant clinical changes.  Will route note to Geriatrics Team for review, may need to adjust daily insulin regimen in near future.  Nobie Putnam, [place], PGY-3

## 2015-01-23 ENCOUNTER — Telehealth: Payer: Self-pay | Admitting: Family Medicine

## 2015-01-23 NOTE — Telephone Encounter (Signed)
Received call from [facility] stating that [name] was not himself and was more confused and limp than at baseline. Sugar was checked and was low at 40. Glucose given and increased to 60 with return to baseline. States that he is now acting normally with normal exam. More alert now and now able to take PO. Recommended PO intake in attempt to get glucose elevated and prevent further drop. Nurses will continue to monitor.  Dr. Gerlean Ren 01/23/15, 1:06 AM

## 2015-01-26 LAB — BASIC METABOLIC PANEL
BUN: 71 mg/dL — AB (ref 4–21)
Creatinine: 1.9 mg/dL — AB (ref 0.6–1.3)
GLUCOSE: 40 mg/dL
POTASSIUM: 3.6 mmol/L (ref 3.4–5.3)
SODIUM: 143 mmol/L (ref 137–147)

## 2015-02-01 ENCOUNTER — Non-Acute Institutional Stay: Payer: Commercial Managed Care - HMO | Admitting: Family Medicine

## 2015-02-01 ENCOUNTER — Encounter: Payer: Self-pay | Admitting: Family Medicine

## 2015-02-01 DIAGNOSIS — I10 Essential (primary) hypertension: Secondary | ICD-10-CM

## 2015-02-01 DIAGNOSIS — E785 Hyperlipidemia, unspecified: Secondary | ICD-10-CM | POA: Diagnosis not present

## 2015-02-01 DIAGNOSIS — H578 Other specified disorders of eye and adnexa: Secondary | ICD-10-CM

## 2015-02-01 DIAGNOSIS — F32A Depression, unspecified: Secondary | ICD-10-CM

## 2015-02-01 DIAGNOSIS — I420 Dilated cardiomyopathy: Secondary | ICD-10-CM

## 2015-02-01 DIAGNOSIS — E1159 Type 2 diabetes mellitus with other circulatory complications: Secondary | ICD-10-CM | POA: Diagnosis not present

## 2015-02-01 DIAGNOSIS — N184 Chronic kidney disease, stage 4 (severe): Secondary | ICD-10-CM

## 2015-02-01 DIAGNOSIS — B182 Chronic viral hepatitis C: Secondary | ICD-10-CM

## 2015-02-01 DIAGNOSIS — F329 Major depressive disorder, single episode, unspecified: Secondary | ICD-10-CM

## 2015-02-01 DIAGNOSIS — H5789 Other specified disorders of eye and adnexa: Secondary | ICD-10-CM

## 2015-02-01 DIAGNOSIS — H409 Unspecified glaucoma: Secondary | ICD-10-CM | POA: Insufficient documentation

## 2015-02-01 NOTE — Assessment & Plan Note (Signed)
Adequate blood pressure control.  No evidence of new end organ damage.  Tolerating medication without significant adverse effects.  Plan to continue current blood pressure regiment.   

## 2015-02-01 NOTE — Progress Notes (Signed)
[name], male   DOB: Jul 24, 1945, 69 y.o.   MRN: [id number] [facility]  Visit  Primary Care Provider: R. Lonny Prude, MD Location of Care: [facility] Visit Information: a scheduled routine follow-up visit Patient accompanied by patient Source(s) of information for visit: patient, nursing home and past medical records and Labs from [facility]  Chief Complaint:  Chief Complaint  Patient presents with  . Regulatory  visit  . Hypertension  . Diabetes  . Depression    Nursing Concerns: Left eye redness. Low CBG to 60 01/23/15 ~0130 hrs symptomatic with alter LOC. Responded quickly to Glucagon IM x 1.   Nutrition Concerns: rapid weight loss   Wound Care Nurse Concerns: Healed left heel pressure ulcer  PT Concerns and Goals: Concerns None;  Patient is not receiving PT/OT;  -  Patient is Intermediate Care Facility status  Family Goals: nursing and custodial care    HISTORY OF PRESENT ILLNESS:  Patient admitted to [facility] for ICF from home as his wife was unable to provide level of care that he requires.  He has been bedridden and dependent for most of his ADLs since a stroke in 2008.   Left eye redness - Onset earlier this week - Not painful, No trauma.  - Patient on Acular, Lumigan and Prednisolone eye drops by ophthalmology for moderate to severe stage Glaucoma.  - No change in already poor vision in left eye.   HYPERTENSION  Disease Monitoring: Blood pressure range-running below 140/90  Chest pain- no      Dyspnea- no  Medications: Compliance- yes Lightheadedness- no   Edema- no   DIABETES  Disease Monitoring: Blood Sugar ranges last week- 0630: CBG 85 to 444;   32 units Novalog in 9 days 1130: CBG 153 to 343;   28 units Novalog in 9 days 1630:  CBG 107 to 373;  50 units Novolog in 8 days 2100: CBG 184 to 534;       Polyuria- no  New Visual problems- no change in baseline  Medications: Compliance- Lantus 12 units  qhs and SSI  Hypoglycemic symptoms- once on 01/23/15 at 0130 am 60 mg/dL and symptomatic.  Assisted Rescue with glucagon IM.     HYPERLIPIDEMIA  Disease Monitoring: See symptoms for Hypertension  Medications: Compliance- yes RUQ pain- no  Muscle aches- no     ROS See HPI above   PMH Smoking Status noted         Outpatient Encounter Prescriptions as of 02/01/2015  Medication Sig  . amLODipine (NORVASC) 10 MG tablet Take 1 tablet (10 mg total) by mouth daily.  Marland Kitchen atorvastatin (LIPITOR) 80 MG tablet Take 1 tablet (80 mg total) by mouth daily at 6 PM.  . bimatoprost (LUMIGAN) 0.01 % SOLN Place 1 drop into the right eye at bedtime.  . carvedilol (COREG) 12.5 MG tablet Take 1 tablet (12.5 mg total) by mouth 2 (two) times daily with a meal.  . clopidogrel (PLAVIX) 75 MG tablet Take 1 tablet (75 mg total) by mouth daily.  . furosemide (LASIX) 40 MG tablet Take 3 tablets (120 mg total) by mouth 2 (two) times daily.  Marland Kitchen gabapentin (NEURONTIN) 100 MG capsule Take 100 mg by mouth 3 (three) times daily.   Marland Kitchen glucagon (GLUCAGON EMERGENCY) 1 MG injection Inject 1 mg into the vein once as needed.  . insulin glargine (LANTUS) 100 UNIT/ML injection Inject 12 Units into the skin daily.   . insulin lispro (HUMALOG) 100 UNIT/ML cartridge Inject  5 Units into the skin 3 (three) times daily with meals. Use PRN for blood sugar above 200   151-200 2 units;  201-250 3 units;  251-300 5 units;  301-350 8 units;  351-400 10 units :  BS ABOVE 400 - CALL MD  . ketorolac (ACULAR) 0.4 % SOLN Place 1 drop into the left eye 4 (four) times daily.  Marland Kitchen levothyroxine (SYNTHROID, LEVOTHROID) 100 MCG tablet Take 100 mcg by mouth daily.  . NON FORMULARY 120 mLs 3 (three) times daily.  Marland Kitchen OVER THE COUNTER MEDICATION Take 1 scoop by mouth 2 (two) times daily. Protein powder mixed with food/drink (25 g of protein per serving) PURE PROTEIN  . polyethylene glycol (MIRALAX / GLYCOLAX) packet Take 17 g by mouth 2 (two) times  daily.  . prednisoLONE acetate (PRED FORTE) 1 % ophthalmic suspension Place 1 drop into the left eye 4 (four) times daily.  . sennosides-docusate sodium (SENOKOT-S) 8.6-50 MG tablet Take 2 tablets by mouth 2 (two) times daily.   . tamsulosin (FLOMAX) 0.4 MG CAPS capsule Take 0.8 mg by mouth at bedtime.   Marland Kitchen tobramycin-dexamethasone (TOBRADEX) ophthalmic solution Place 1 drop into the left eye 2 (two) times daily.  . Vortioxetine HBr (BRINTELLIX) 10 MG TABS Take 10 mg by mouth daily.   Marland Kitchen zinc sulfate 220 MG capsule Take 220 mg by mouth daily.  . [DISCONTINUED] Multiple Vitamins-Minerals (DECUBI-VITE PO) Take 1 tablet by mouth daily.   Facility-Administered Encounter Medications as of 02/01/2015  Medication  . gatifloxacin (ZYMAXID) 0.5 % ophthalmic drops 1 drop  . phenylephrine (MYDFRIN) 2.5 % ophthalmic solution 1 drop   No Known Allergies History Patient Active Problem List   Diagnosis Date Noted  . Hepatitis C infection 01/04/2015  . Person living in residential institution 12/28/2014  . Depression 12/28/2014  . HLD (hyperlipidemia) 06/16/2014  . Peripheral vision loss 06/16/2014  . 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 Chronic kidney disease (CKD), stage IV (severe) ([place]) 05/27/2014  . Essential hypertension   . Type 2 diabetes mellitus with other circulatory complications ([place])   . Congestive dilated cardiomyopathy ([place]) 04/11/2014  . History of cerebrovascular accident with residual effects    Past Medical History  Diagnosis Date  . Hypertension   . Hypercholesterolemia   . Glaucoma   . Cerebrovascular disease     a. 01/2007 MRI/A: L MCA 85-88%, R MCA 50%, LICA 27%.  . Chronic combined systolic and diastolic CHF, NYHA class 2 ([place])     a. 01/2007 Echo: EF 60-65%;  b. 03/2014 Echo: EF 45-50%, Gr 1 DD, severe LVH.  Marland Kitchen Headache   . Bedridden     transfers to chair  . Pneumonia 10/2006  . Hypothyroidism   . GERD (gastroesophageal  reflux disease)   . Stroke ([place]) 01/2007    L corona radiata infarct; "left his right side weak" (09/16/2014)  . Stroke [facility]) 03/2014    "while in hospital; left side extremely weak since" (09/16/2014)  . Hepatitis dx'd 03/2014    C  . Type II diabetes mellitus (HCC)     Type II  . Depression   . CKD (chronic kidney disease), stage IV ([place])     [facility]  . History of cerebrovascular accident with residual effects     a. 01/2007 MRI/A: L MCA 74-12%, R MCA 87%, LICA 86%.   . Chronic kidney disease (CKD), stage IV (severe) ([place]) 05/27/2014  . Hepatitis C infection 01/04/2015    -  Pt with HCV (+) antibody and HCV RNA Quant Log = 5.87 (H) (04/10/14)   Past Surgical History  Procedure Laterality Date  . Colonoscopy w/ biopsies and polypectomy  2002  . Colon surgery      Polyps removed- surgically  . Trabeculectomy Left 08/18/2013    Procedure: TRABECULECTOMY WITH TUBE WITH [facility];  Surgeon: [name], MD;  Location: [place];  Service: Ophthalmology;  Laterality: Left;  . Colonoscopy  2015  . Cataract extraction extracapsular Left 12/21/2014    Procedure: CATARACT EXTRACTION EXTRACAPSULAR WITH INTRAOCULAR LENS PLACEMENT LEFT EYE;  Surgeon: [name], MD;  Location: [place];  Service: Ophthalmology;  Laterality: Left;   Family History  Problem Relation Age of Onset  . Family history unknown: Yes    reports that he has never smoked. He has never used smokeless tobacco. He reports that he does not drink alcohol or use illicit drugs.  Basic Activities of Daily Living   ADLs Independent Needs Assistance Dependent  Bathing     Dressing     Ambulation     Toileting     Eating        Instrumental Activities of Daily Living  IADL Independent Needs Assistance Dependent  Cooking     Housework     Manage Medications     Manage the telephone     Shopping for food, clothes, Meds, etc     Use transportation     [place] in the past six months:   no  Diet:   diabetic, - calorie  Nourishment: adequate   Nutritional Supplements:  Medpass: yes Magic Cup:no  Prostat:yes  Juven:no    Review of Systems  Patient has ability to communicate answers to ROS: yes See HPI  Geriatric Syndromes: Constipation yes ,   Incontinence no  Dizziness no   Syncope no   Skin problems no   Visual Impairment yes   Hearing impairment no  Eating impairment no  Impaired Memory or Cognition no   Behavioral problems no   Sleep problems no   Weight loss no    Pain:  None  Dyspnea: None  PHYSICAL EXAM:. Wt Readings from Last 3 Encounters:  01/27/15 132 lb 11.2 oz (60.192 kg)  12/21/14 125 lb (56.7 kg)  12/08/14 145 lb (65.772 kg)   Temp Readings from Last 3 Encounters:  01/28/15 98 F (36.7 C)   12/21/14 97.8 F (36.6 C)   09/17/14 98.2 F (36.8 C) Oral   BP Readings from Last 3 Encounters:  01/28/15 136/72  12/21/14 142/79  12/08/14 120/70   Pulse Readings from Last 3 Encounters:  01/28/15 70  12/21/14 57  12/08/14 72    General: alert, cooperative, no distress, well nourished, pleasant, clean, groomed HEENT: No scleral icterus, no nasal secretions, EACs not occluded, TMs clear bilat, [place] moist and no erythema or lesion Neck: Supple, No JVD, no lymphadenopathy CV: RRR, no murmur, no ankle swelling RESP: No resp distress or accessory muscle use. Clear to ausc bilat. No wheezing, no rales, no rhonchi.  ABD: Soft, Non-tender, non-distended, +bowel sounds, no masses MSK: No back pain, no joint pain. No joint swelling or redness EXT: Warm and well perfused no edema, no erythema, pulses WNL Gait: Non ambulatory Skin: heel pressure ulcer scar intact Psych: Orientation oriented to person, place, time, and general circumstances; Judgment Good, Insight Intact Memory recent and remote memory intact; Attention Normal; Mood appropriate; Speech normal; Language none ; Thought  Coherent  No flowsheet data found. No flowsheet  data found.  Years of Education: 16 +  Assessment and Plan:   See Problem List for individual problem's assessment and plans.       Code Status:    Full Code   Emergency contact:   [place], [place] [phone]  [phone]  Address: [place] [address]            [place] Daughter [phone]  [phone]     Follow Up:  Next 30 days unless acute issues arise.

## 2015-02-01 NOTE — Assessment & Plan Note (Addendum)
Established problem that is clinically stqable Recheck LFTs/INR/ALbumin now that pt taking atorvastatin reliably at 80 mg daily.  Needs RUQ ultrasound to look for [facility]

## 2015-02-01 NOTE — Assessment & Plan Note (Signed)
Established problem. Stable. Continue current therapy  

## 2015-02-01 NOTE — Assessment & Plan Note (Signed)
Established problem worsened.  Uncontrolled hyperglycemia Plan Start Prandial Novolog with each meal Continue basal Lantus 12 units  Continue SSI Novolog TID with CBG qac Check A1c next reg visit.

## 2015-02-01 NOTE — Assessment & Plan Note (Signed)
New problem No further workup Resolved. Observe for recurrence.

## 2015-02-01 NOTE — Assessment & Plan Note (Signed)
Lab Results  Component Value Date   CREATININE 1.9* 01/26/2015  Stable Continue to monitor. When is next Nephology appointment?

## 2015-02-01 NOTE — Assessment & Plan Note (Signed)
Adequate symptom control. Tolerating coreg and lasix medication. Continue current medication regiment.

## 2015-02-05 ENCOUNTER — Observation Stay (HOSPITAL_COMMUNITY)
Admission: EM | Admit: 2015-02-05 | Discharge: 2015-02-07 | Disposition: A | Discharge status: Skilled Nursing Facility | Payer: Commercial Managed Care - HMO | Attending: Family Medicine | Admitting: Family Medicine

## 2015-02-05 DIAGNOSIS — H409 Unspecified glaucoma: Secondary | ICD-10-CM | POA: Insufficient documentation

## 2015-02-05 DIAGNOSIS — B1920 Unspecified viral hepatitis C without hepatic coma: Secondary | ICD-10-CM | POA: Insufficient documentation

## 2015-02-05 DIAGNOSIS — E039 Hypothyroidism, unspecified: Secondary | ICD-10-CM | POA: Insufficient documentation

## 2015-02-05 DIAGNOSIS — N390 Urinary tract infection, site not specified: Secondary | ICD-10-CM | POA: Insufficient documentation

## 2015-02-05 DIAGNOSIS — E7800 Pure hypercholesterolemia, unspecified: Secondary | ICD-10-CM | POA: Diagnosis not present

## 2015-02-05 DIAGNOSIS — I5042 Chronic combined systolic (congestive) and diastolic (congestive) heart failure: Secondary | ICD-10-CM | POA: Diagnosis not present

## 2015-02-05 DIAGNOSIS — E1122 Type 2 diabetes mellitus with diabetic chronic kidney disease: Secondary | ICD-10-CM | POA: Insufficient documentation

## 2015-02-05 DIAGNOSIS — Z794 Long term (current) use of insulin: Secondary | ICD-10-CM | POA: Insufficient documentation

## 2015-02-05 DIAGNOSIS — N184 Chronic kidney disease, stage 4 (severe): Secondary | ICD-10-CM | POA: Insufficient documentation

## 2015-02-05 DIAGNOSIS — N179 Acute kidney failure, unspecified: Secondary | ICD-10-CM | POA: Diagnosis not present

## 2015-02-05 DIAGNOSIS — E785 Hyperlipidemia, unspecified: Secondary | ICD-10-CM | POA: Diagnosis not present

## 2015-02-05 DIAGNOSIS — Z7902 Long term (current) use of antithrombotics/antiplatelets: Secondary | ICD-10-CM | POA: Insufficient documentation

## 2015-02-05 DIAGNOSIS — I129 Hypertensive chronic kidney disease with stage 1 through stage 4 chronic kidney disease, or unspecified chronic kidney disease: Secondary | ICD-10-CM | POA: Insufficient documentation

## 2015-02-05 DIAGNOSIS — Z79899 Other long term (current) drug therapy: Secondary | ICD-10-CM | POA: Insufficient documentation

## 2015-02-05 DIAGNOSIS — Z8673 Personal history of transient ischemic attack (TIA), and cerebral infarction without residual deficits: Secondary | ICD-10-CM | POA: Diagnosis not present

## 2015-02-05 DIAGNOSIS — F0390 Unspecified dementia without behavioral disturbance: Secondary | ICD-10-CM | POA: Insufficient documentation

## 2015-02-05 DIAGNOSIS — E11649 Type 2 diabetes mellitus with hypoglycemia without coma: Principal | ICD-10-CM | POA: Insufficient documentation

## 2015-02-05 DIAGNOSIS — E162 Hypoglycemia, unspecified: Secondary | ICD-10-CM | POA: Diagnosis present

## 2015-02-05 DIAGNOSIS — F329 Major depressive disorder, single episode, unspecified: Secondary | ICD-10-CM | POA: Diagnosis not present

## 2015-02-05 LAB — CBG MONITORING, ED: GLUCOSE-CAPILLARY: 46 mg/dL — AB (ref 65–99)

## 2015-02-05 NOTE — ED Provider Notes (Signed)
CSN: [id number]   Arrival date & time 02/05/15 2348  History  By signing my name below, I, [name], attest that this documentation has been prepared under the direction and in the presence of Matthew Hacker, MD. Electronically Signed: Altamease [name], ED Scribe. 02/06/2015. 1:08 AM.  Chief Complaint  Patient presents with  . Hypoglycemia   Level V caveat secondary to AMS.   HPI The history is provided by a relative and the EMS personnel. The history is limited by the condition of the patient. No language interpreter was used.   Brought in by EMS from [place], [name] is a 69 y.o. male who is bedridden with history of stroke, DM, HTN, hypercholesteremia, CHF, hepatitis C, CKD who presents to the Emergency Department complaining of AMS and hypoglycemia with onset tonight. Novelog was added to the patient's diabetic regimen before dinner days ago. His blood sugar was 22 at the facility and 36 by EMS. An amp of D50 was given by EMS. Per facility staff, pt is conversational but disoriented at baseline. He has some residual right sided weakness secondary to a stroke. Repeat blood sugar here was 46 and patient was given an additional amp of D50. Pt denies any pain. Per relative at bedside pt is [place] speaking.   Past Medical History  Diagnosis Date  . Hypertension   . Hypercholesterolemia   . Glaucoma   . Cerebrovascular disease     a. 01/2007 MRI/A: L MCA A999333, R MCA A999333, LICA 123456.  . Chronic combined systolic and diastolic CHF, NYHA class 2 ([place])     a. 01/2007 Echo: EF 60-65%;  b. 03/2014 Echo: EF 45-50%, Gr 1 DD, severe LVH.  Marland Kitchen Headache   . Bedridden     transfers to chair  . Pneumonia 10/2006  . Hypothyroidism   . GERD (gastroesophageal reflux disease)   . Stroke ([place]) 01/2007    L corona radiata infarct; "left his right side weak" (09/16/2014)  . Stroke [facility]) 03/2014    "while in hospital; left side extremely weak since" (09/16/2014)  . Hepatitis dx'd 03/2014     C  . Type II diabetes mellitus (HCC)     Type II  . Depression   . CKD (chronic kidney disease), stage IV ([place])     [facility]  . History of cerebrovascular accident with residual effects     a. 01/2007 MRI/A: L MCA A999333, R MCA A999333, LICA 123456.   . Chronic kidney disease (CKD), stage IV (severe) ([place]) 05/27/2014  . Hepatitis C infection 01/04/2015    - Pt with HCV (+) antibody and HCV RNA Quant Log = 5.87 (H) (04/10/14)    Past Surgical History  Procedure Laterality Date  . Colonoscopy w/ biopsies and polypectomy  2002  . Colon surgery      Polyps removed- surgically  . Trabeculectomy Left 08/18/2013    Procedure: TRABECULECTOMY WITH TUBE WITH [facility];  Surgeon: [name], MD;  Location: [place];  Service: Ophthalmology;  Laterality: Left;  . Colonoscopy  2015  . Cataract extraction extracapsular Left 12/21/2014    Procedure: CATARACT EXTRACTION EXTRACAPSULAR WITH INTRAOCULAR LENS PLACEMENT LEFT EYE;  Surgeon: [name], MD;  Location: [place];  Service: Ophthalmology;  Laterality: Left;    Family History  Problem Relation Age of Onset  . Family history unknown: Yes    Social History  Substance Use Topics  . Smoking status: Never Smoker   . Smokeless tobacco: Never Used  . Alcohol Use: No  Review of Systems  Unable to perform ROS: Mental status change   Home Medications   Prior to Admission medications   Medication Sig Start Date End Date Taking? Authorizing Provider  amLODipine (NORVASC) 10 MG tablet Take 1 tablet (10 mg total) by mouth daily. 04/18/14   Erline Hau, MD  atorvastatin (LIPITOR) 80 MG tablet Take 1 tablet (80 mg total) by mouth daily at 6 PM. 04/18/14   Erline Hau, MD  bimatoprost (LUMIGAN) 0.01 % SOLN Place 1 drop into the right eye at bedtime.    Historical Provider, MD  carvedilol (COREG) 12.5 MG tablet Take 1 tablet (12.5 mg total) by mouth 2 (two) times daily with a meal. 04/18/14   Erline Hau, MD   clopidogrel (PLAVIX) 75 MG tablet Take 1 tablet (75 mg total) by mouth daily. 04/18/14   Erline Hau, MD  furosemide (LASIX) 40 MG tablet Take 3 tablets (120 mg total) by mouth 2 (two) times daily. 04/18/14   Erline Hau, MD  gabapentin (NEURONTIN) 100 MG capsule Take 100 mg by mouth 3 (three) times daily.  11/20/14   Historical Provider, MD  glucagon (GLUCAGON EMERGENCY) 1 MG injection Inject 1 mg into the vein once as needed.    Historical Provider, MD  insulin glargine (LANTUS) 100 UNIT/ML injection Inject 12 Units into the skin daily.     Historical Provider, MD  insulin lispro (HUMALOG) 100 UNIT/ML cartridge Inject 5 Units into the skin 3 (three) times daily with meals. Use PRN for blood sugar above 200   151-200 2 units;  201-250 3 units;  251-300 5 units;  301-350 8 units;  351-400 10 units :  BS ABOVE 400 - CALL MD    Historical Provider, MD  ketorolac (ACULAR) 0.4 % SOLN Place 1 drop into the left eye 4 (four) times daily.    Historical Provider, MD  levothyroxine (SYNTHROID, LEVOTHROID) 100 MCG tablet Take 100 mcg by mouth daily. 09/13/14   Historical Provider, MD  NON FORMULARY 120 mLs 3 (three) times daily.    Historical Provider, MD  OVER THE COUNTER MEDICATION Take 1 scoop by mouth 2 (two) times daily. Protein powder mixed with food/drink (25 g of protein per serving) PURE PROTEIN    Historical Provider, MD  polyethylene glycol (MIRALAX / GLYCOLAX) packet Take 17 g by mouth 2 (two) times daily.    Historical Provider, MD  prednisoLONE acetate (PRED FORTE) 1 % ophthalmic suspension Place 1 drop into the left eye 4 (four) times daily.    Historical Provider, MD  sennosides-docusate sodium (SENOKOT-S) 8.6-50 MG tablet Take 2 tablets by mouth 2 (two) times daily.     Historical Provider, MD  tamsulosin (FLOMAX) 0.4 MG CAPS capsule Take 0.8 mg by mouth at bedtime.     Historical Provider, MD  tobramycin-dexamethasone [facility]) ophthalmic solution Place 1 drop into  the left eye 2 (two) times daily.    Historical Provider, MD  Vortioxetine HBr (BRINTELLIX) 10 MG TABS Take 10 mg by mouth daily.     Historical Provider, MD  zinc sulfate 220 MG capsule Take 220 mg by mouth daily.    Historical Provider, MD    Allergies  Review of patient's allergies indicates no known allergies.  Triage Vitals: BP 140/102 mmHg  Pulse 65  Temp(Src) 97.4 F (36.3 C) (Oral)  Resp 16  SpO2 100%  Physical Exam  Constitutional:  Somnolent but arousable  HENT:  Head: Normocephalic and  atraumatic.  Eyes:  Right pupil 6 mm reactive, left pupil 4 mm reactive  Neck: Neck supple.  Cardiovascular: Normal rate, regular rhythm and normal heart sounds.   No murmur heard. Pulmonary/Chest: Effort normal and breath sounds normal. No respiratory distress. He has no wheezes.  Abdominal: Soft. Bowel sounds are normal. There is no tenderness. There is no rebound.  Musculoskeletal: He exhibits no edema.  Neurological:  Somnolent but arousable, will answer simple yes or no questions, moves bilateral upper extremities spontaneously  Skin: Skin is warm and dry.  Nursing note and vitals reviewed.   ED Course  Procedures   DIAGNOSTIC STUDIES: Oxygen Saturation is 100% on RA, normal by my interpretation.    COORDINATION OF CARE: 12:12 AM Treatment plan includes lab work and EKG.  Labs Reviewed  CBC WITH DIFFERENTIAL/PLATELET - Abnormal; Notable for the following:    RBC 3.28 (*)    Hemoglobin 10.2 (*)    HCT 31.1 (*)    All other components within normal limits  COMPREHENSIVE METABOLIC PANEL - Abnormal; Notable for the following:    Glucose, Bld 126 (*)    BUN 61 (*)    Creatinine, Ser 2.44 (*)    Albumin 3.1 (*)    AST 81 (*)    ALT 72 (*)    Alkaline Phosphatase 132 (*)    GFR calc non Af Amer 26 (*)    GFR calc Af Amer 30 (*)    All other components within normal limits  URINALYSIS, ROUTINE W REFLEX MICROSCOPIC (NOT AT [facility]) - Abnormal; Notable for the following:     APPearance TURBID (*)    Hgb urine dipstick MODERATE (*)    Protein, ur 100 (*)    Nitrite POSITIVE (*)    Leukocytes, UA LARGE (*)    All other components within normal limits  URINE MICROSCOPIC-ADD ON - Abnormal; Notable for the following:    Bacteria, UA MANY (*)    All other components within normal limits  CBG MONITORING, ED - Abnormal; Notable for the following:    Glucose-Capillary 46 (*)    All other components within normal limits  CBG MONITORING, ED - Abnormal; Notable for the following:    Glucose-Capillary 189 (*)    All other components within normal limits  URINE CULTURE  CULTURE, BLOOD (ROUTINE X 2)  CULTURE, BLOOD (ROUTINE X 2)  CBG MONITORING, ED  CBG MONITORING, ED    Imaging Review No results found.  I personally reviewed and evaluated these lab results as a part of my medical decision-making.   EKG Interpretation  Date/Time:  Sunday February 05 2015 23:57:23 EST Ventricular Rate:  68 PR Interval:  196 QRS Duration: 96 QT Interval:  423 QTC Calculation: 450 R Axis:   37 Text Interpretation:  Sinus rhythm Probable left ventricular hypertrophy Nonspecific T abnormalities, lateral leads No significant change since last tracing Confirmed by HORTON  MD, COURTNEY ([id number]) on 02/06/2015 12:16:33 AM    MDM   Final diagnoses:  UTI (lower urinary tract infection)  Hypoglycemia    Patient presents with altered mental status and hypoglycemia. Not yet back at his baseline per his family member. Recent adjustments of insulin at his living facility with the reported addition of NovoLog.  Repeat blood sugar after 2 A of D50 is 189. Additional lab work obtained. Mild AK with a creatinine of 2.44. Baseline 1.9.  No leukocytosis. Patient is afebrile. Urinalysis does show nitrite-positive urine too numerous to count white cells. Urine culture  and blood cultures obtained. Patient given IV Rocephin. Repeat blood sugars 90 then 84 without intervention. We'll continue to  monitor as patient continues to be downtrending. Given patient's overall clinical health, hypoglycemia, and urinary tract infection, feel patient would benefit from observation with hydration and IV antibiotics. Continue to monitor blood sugars. If he continues to drop, he would need dextrose-containing fluids.  I personally performed the services described in this documentation, which was scribed in my presence. The recorded information has been reviewed and is accurate.    Matthew Hacker, MD 02/06/15 [phone]

## 2015-02-06 ENCOUNTER — Encounter (HOSPITAL_COMMUNITY): Payer: Self-pay | Admitting: Emergency Medicine

## 2015-02-06 DIAGNOSIS — N390 Urinary tract infection, site not specified: Secondary | ICD-10-CM | POA: Diagnosis not present

## 2015-02-06 DIAGNOSIS — E11649 Type 2 diabetes mellitus with hypoglycemia without coma: Secondary | ICD-10-CM | POA: Diagnosis not present

## 2015-02-06 DIAGNOSIS — N179 Acute kidney failure, unspecified: Secondary | ICD-10-CM | POA: Diagnosis not present

## 2015-02-06 DIAGNOSIS — E162 Hypoglycemia, unspecified: Secondary | ICD-10-CM | POA: Diagnosis present

## 2015-02-06 DIAGNOSIS — Z794 Long term (current) use of insulin: Secondary | ICD-10-CM

## 2015-02-06 HISTORY — DX: Hypoglycemia, unspecified: E16.2

## 2015-02-06 LAB — COMPREHENSIVE METABOLIC PANEL
ALK PHOS: 132 U/L — AB (ref 38–126)
ALT: 72 U/L — AB (ref 17–63)
AST: 81 U/L — AB (ref 15–41)
Albumin: 3.1 g/dL — ABNORMAL LOW (ref 3.5–5.0)
Anion gap: 10 (ref 5–15)
BILIRUBIN TOTAL: 0.3 mg/dL (ref 0.3–1.2)
BUN: 61 mg/dL — AB (ref 6–20)
CO2: 28 mmol/L (ref 22–32)
CREATININE: 2.44 mg/dL — AB (ref 0.61–1.24)
Calcium: 8.9 mg/dL (ref 8.9–10.3)
Chloride: 104 mmol/L (ref 101–111)
GFR calc Af Amer: 30 mL/min — ABNORMAL LOW (ref >60)
GFR, EST NON AFRICAN AMERICAN: 26 mL/min — AB (ref >60)
Glucose, Bld: 126 mg/dL — ABNORMAL HIGH (ref 65–99)
Potassium: 3.5 mmol/L (ref 3.5–5.1)
Sodium: 142 mmol/L (ref 135–145)
Total Protein: 7.4 g/dL (ref 6.5–8.1)

## 2015-02-06 LAB — URINALYSIS, ROUTINE W REFLEX MICROSCOPIC
Bilirubin Urine: NEGATIVE
GLUCOSE, UA: NEGATIVE mg/dL
Ketones, ur: NEGATIVE mg/dL
Nitrite: POSITIVE — AB
PROTEIN: 100 mg/dL — AB
SPECIFIC GRAVITY, URINE: 1.012 (ref 1.005–1.030)
Urobilinogen, UA: 0.2 mg/dL (ref 0.0–1.0)
pH: 6 (ref 5.0–8.0)

## 2015-02-06 LAB — CBC WITH DIFFERENTIAL/PLATELET
BASOS ABS: 0 10*3/uL (ref 0.0–0.1)
Basophils Relative: 0 %
Eosinophils Absolute: 0 10*3/uL (ref 0.0–0.7)
Eosinophils Relative: 0 %
HEMATOCRIT: 31.1 % — AB (ref 39.0–52.0)
Hemoglobin: 10.2 g/dL — ABNORMAL LOW (ref 13.0–17.0)
LYMPHS PCT: 18 %
Lymphs Abs: 1.1 10*3/uL (ref 0.7–4.0)
MCH: 31.1 pg (ref 26.0–34.0)
MCHC: 32.8 g/dL (ref 30.0–36.0)
MCV: 94.8 fL (ref 78.0–100.0)
MONO ABS: 0.5 10*3/uL (ref 0.1–1.0)
Monocytes Relative: 8 %
NEUTROS ABS: 4.5 10*3/uL (ref 1.7–7.7)
Neutrophils Relative %: 74 %
Platelets: 159 10*3/uL (ref 150–400)
RBC: 3.28 MIL/uL — ABNORMAL LOW (ref 4.22–5.81)
RDW: 13.6 % (ref 11.5–15.5)
WBC: 6.1 10*3/uL (ref 4.0–10.5)

## 2015-02-06 LAB — GLUCOSE, CAPILLARY
GLUCOSE-CAPILLARY: 192 mg/dL — AB (ref 65–99)
GLUCOSE-CAPILLARY: 237 mg/dL — AB (ref 65–99)
GLUCOSE-CAPILLARY: 289 mg/dL — AB (ref 65–99)
Glucose-Capillary: 154 mg/dL — ABNORMAL HIGH (ref 65–99)
Glucose-Capillary: 258 mg/dL — ABNORMAL HIGH (ref 65–99)
Glucose-Capillary: 112 mg/dL — ABNORMAL HIGH (ref 65–99)

## 2015-02-06 LAB — CBC
HEMATOCRIT: 28.1 % — AB (ref 39.0–52.0)
HEMOGLOBIN: 9.1 g/dL — AB (ref 13.0–17.0)
MCH: 30.6 pg (ref 26.0–34.0)
MCHC: 32.4 g/dL (ref 30.0–36.0)
MCV: 94.6 fL (ref 78.0–100.0)
PLATELETS: 144 10*3/uL — AB (ref 150–400)
RBC: 2.97 MIL/uL — AB (ref 4.22–5.81)
RDW: 13.6 % (ref 11.5–15.5)
WBC: 7 10*3/uL (ref 4.0–10.5)

## 2015-02-06 LAB — BASIC METABOLIC PANEL
ANION GAP: 8 (ref 5–15)
BUN: 56 mg/dL — ABNORMAL HIGH (ref 6–20)
CHLORIDE: 104 mmol/L (ref 101–111)
CO2: 29 mmol/L (ref 22–32)
CREATININE: 2.23 mg/dL — AB (ref 0.61–1.24)
Calcium: 8.3 mg/dL — ABNORMAL LOW (ref 8.9–10.3)
GFR calc non Af Amer: 29 mL/min — ABNORMAL LOW (ref >60)
GFR, EST AFRICAN AMERICAN: 33 mL/min — AB (ref >60)
Glucose, Bld: 234 mg/dL — ABNORMAL HIGH (ref 65–99)
POTASSIUM: 3.7 mmol/L (ref 3.5–5.1)
SODIUM: 141 mmol/L (ref 135–145)

## 2015-02-06 LAB — CBG MONITORING, ED
GLUCOSE-CAPILLARY: 74 mg/dL (ref 65–99)
GLUCOSE-CAPILLARY: 84 mg/dL (ref 65–99)
Glucose-Capillary: 175 mg/dL — ABNORMAL HIGH (ref 65–99)
Glucose-Capillary: 189 mg/dL — ABNORMAL HIGH (ref 65–99)
Glucose-Capillary: 90 mg/dL (ref 65–99)

## 2015-02-06 LAB — URINE MICROSCOPIC-ADD ON

## 2015-02-06 LAB — TSH: TSH: 14.397 u[IU]/mL — ABNORMAL HIGH (ref 0.350–4.500)

## 2015-02-06 MED ORDER — CARVEDILOL 12.5 MG PO TABS
12.5000 mg | ORAL_TABLET | Freq: Two times a day (BID) | ORAL | Status: DC
  Administered 2015-02-06 – 2015-02-07 (×3): 12.5 mg via ORAL
  Filled 2015-02-06 (×3): qty 1

## 2015-02-06 MED ORDER — AMLODIPINE BESYLATE 10 MG PO TABS
10.0000 mg | ORAL_TABLET | Freq: Every day | ORAL | Status: DC
  Administered 2015-02-06 – 2015-02-07 (×2): 10 mg via ORAL
  Filled 2015-02-06 (×2): qty 1

## 2015-02-06 MED ORDER — INSULIN ASPART 100 UNIT/ML ~~LOC~~ SOLN
0.0000 [IU] | Freq: Three times a day (TID) | SUBCUTANEOUS | Status: DC
  Administered 2015-02-06: 5 [IU] via SUBCUTANEOUS

## 2015-02-06 MED ORDER — KETOROLAC TROMETHAMINE 0.5 % OP SOLN
1.0000 [drp] | Freq: Three times a day (TID) | OPHTHALMIC | Status: DC
  Administered 2015-02-06 – 2015-02-07 (×3): 1 [drp] via OPHTHALMIC
  Filled 2015-02-06 (×2): qty 3

## 2015-02-06 MED ORDER — LEVOTHYROXINE SODIUM 100 MCG PO TABS
100.0000 ug | ORAL_TABLET | Freq: Every day | ORAL | Status: DC
  Administered 2015-02-06 – 2015-02-07 (×2): 100 ug via ORAL
  Filled 2015-02-06 (×2): qty 1

## 2015-02-06 MED ORDER — SENNOSIDES-DOCUSATE SODIUM 8.6-50 MG PO TABS
2.0000 | ORAL_TABLET | Freq: Two times a day (BID) | ORAL | Status: DC
  Administered 2015-02-06 – 2015-02-07 (×3): 2 via ORAL
  Filled 2015-02-06 (×3): qty 2

## 2015-02-06 MED ORDER — GABAPENTIN 100 MG PO CAPS
100.0000 mg | ORAL_CAPSULE | Freq: Three times a day (TID) | ORAL | Status: DC
  Administered 2015-02-06 – 2015-02-07 (×4): 100 mg via ORAL
  Filled 2015-02-06 (×4): qty 1

## 2015-02-06 MED ORDER — HEPARIN SODIUM (PORCINE) 5000 UNIT/ML IJ SOLN
5000.0000 [IU] | Freq: Three times a day (TID) | INTRAMUSCULAR | Status: DC
  Administered 2015-02-06 – 2015-02-07 (×3): 5000 [IU] via SUBCUTANEOUS
  Filled 2015-02-06 (×3): qty 1

## 2015-02-06 MED ORDER — DEXTROSE-NACL 5-0.9 % IV SOLN
INTRAVENOUS | Status: DC
Start: 2015-02-06 — End: 2015-02-06
  Administered 2015-02-06: 04:00:00 via INTRAVENOUS

## 2015-02-06 MED ORDER — TAMSULOSIN HCL 0.4 MG PO CAPS
0.8000 mg | ORAL_CAPSULE | Freq: Every day | ORAL | Status: DC
  Administered 2015-02-06: 0.8 mg via ORAL
  Filled 2015-02-06: qty 2

## 2015-02-06 MED ORDER — CLOPIDOGREL BISULFATE 75 MG PO TABS
75.0000 mg | ORAL_TABLET | Freq: Every day | ORAL | Status: DC
  Administered 2015-02-06 – 2015-02-07 (×2): 75 mg via ORAL
  Filled 2015-02-06 (×2): qty 1

## 2015-02-06 MED ORDER — SODIUM CHLORIDE 0.9 % IV SOLN
INTRAVENOUS | Status: DC
  Administered 2015-02-06 – 2015-02-07 (×2): via INTRAVENOUS

## 2015-02-06 MED ORDER — DEXTROSE 50 % IV SOLN
1.0000 | Freq: Once | INTRAVENOUS | Status: AC
  Administered 2015-02-06: 50 mL via INTRAVENOUS

## 2015-02-06 MED ORDER — ATORVASTATIN CALCIUM 80 MG PO TABS
80.0000 mg | ORAL_TABLET | Freq: Every day | ORAL | Status: DC
  Administered 2015-02-06: 80 mg via ORAL
  Filled 2015-02-06: qty 1

## 2015-02-06 MED ORDER — SODIUM CHLORIDE 0.9 % IV BOLUS (SEPSIS)
500.0000 mL | Freq: Once | INTRAVENOUS | Status: AC
  Administered 2015-02-06: 500 mL via INTRAVENOUS

## 2015-02-06 MED ORDER — PREDNISOLONE ACETATE 1 % OP SUSP
1.0000 [drp] | Freq: Three times a day (TID) | OPHTHALMIC | Status: DC
  Administered 2015-02-06 – 2015-02-07 (×5): 1 [drp] via OPHTHALMIC
  Filled 2015-02-06 (×2): qty 5

## 2015-02-06 MED ORDER — DEXTROSE 5 % IV SOLN
1.0000 g | INTRAVENOUS | Status: DC
  Administered 2015-02-06: 1 g via INTRAVENOUS
  Filled 2015-02-06: qty 10

## 2015-02-06 MED ORDER — SODIUM CHLORIDE 0.9 % IV SOLN
INTRAVENOUS | Status: DC
  Administered 2015-02-06: 10:00:00 via INTRAVENOUS
  Filled 2015-02-06: qty 1000

## 2015-02-06 MED ORDER — POLYETHYLENE GLYCOL 3350 17 G PO PACK
17.0000 g | PACK | Freq: Two times a day (BID) | ORAL | Status: DC
  Administered 2015-02-06 – 2015-02-07 (×3): 17 g via ORAL
  Filled 2015-02-06 (×3): qty 1

## 2015-02-06 MED ORDER — INSULIN GLARGINE 100 UNIT/ML ~~LOC~~ SOLN
10.0000 [IU] | Freq: Every day | SUBCUTANEOUS | Status: DC
  Administered 2015-02-06 – 2015-02-07 (×2): 10 [IU] via SUBCUTANEOUS
  Filled 2015-02-06 (×2): qty 0.1

## 2015-02-06 MED ORDER — LATANOPROST 0.005 % OP SOLN
1.0000 [drp] | Freq: Every day | OPHTHALMIC | Status: DC
  Administered 2015-02-06: 1 [drp] via OPHTHALMIC
  Filled 2015-02-06 (×2): qty 2.5

## 2015-02-06 MED ORDER — VORTIOXETINE HBR 20 MG PO TABS
10.0000 mg | ORAL_TABLET | Freq: Every day | ORAL | Status: DC
  Administered 2015-02-06 – 2015-02-07 (×2): 10 mg via ORAL
  Filled 2015-02-06 (×2): qty 10

## 2015-02-06 NOTE — H&P (Signed)
[facility] Admission History and Physical Service Pager: [phone]  Patient name: [name] Medical record number: [id number] Date of birth: [birth year] Age: 69 y.o. Gender: male  Primary Care Provider: Cordelia Poche, MD Consultants: None Code Status: Full  Chief Complaint: Hypoglycemia  Assessment and Plan: [name] is a 69 y.o. male presenting with hypoglycemia and AKI. 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  1. Hypoglycemia in T2DM: Last HgbA1c: 7.9%. Current regimen: Lantus 10 units daily, Novolog 20 units tid WC, Humalog prn for blood sugars > 200 per sliding scale. His AKI may have contributed to decreased insulin clearance, making the hypoglycemia worse. - Admit to [place], attending Dr. Gwendlyn Deutscher. - CBGs q78mn x 2 hours. Will space them out after that depending on if they are trending up or down. If trending down, will give another amp of D50.  - 3 amps of D50 have been given, so will start D5-containing IVF.  - Holding all insulin for now, will need restart of lower doses once no longer hypoglycemic.  - Will get am CBC, BMET  2. Asymptomatic bacturia: UA showing many bacteria, large leukocytes, positive nitrites, too numerous to count WBCs.  - Given Rocephin x 1 in the ED. Will hold off on antibiotics for now, as Pt denies dysuria and is afebrile with a normal WBC count. Would have low threshold to reinitiate treatment if any signs/sxs of infection develop. - Urine culture pending - Blood cultures drawn in the ED are pending.  3. AKI on CKD stage IV: Creatinine on admission was 2.44. Baseline is around 1.8-1.9. Likely pre-renal in etiology, given his BUN/Cr ratio >20:1. - Hydration with D5 NS at 1051mhr - Daily BMETs - Avoid nephrotoxic agents  4. Hx CVA: Occurred in 03/2014. Pt has residual right-sided weakness. - Continue home med: Plavix 7563md  5. HTN;  BP 157/80 on admission. - Continue home meds: Norvasc 29m54m, Carvedilol 12.5mg 48m  6. HLD: Last lipid panel (04/12/14): Chol 268, TG 124, HDL 58, LDL 185. - Continue home med: Lipitor 80mg 78m 7. 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 - Continue Carvedilol 12.5mg qd39mHolding home Lasix 120mg bi44m the setting of AKI. Pt does not appear fluid overloaded on exam. Will add back slowly once AKI resolves.  8. Hepatitis C: AST 81, ALT 72, alk phos 132, T bili 0.3. - Will continue to monitor  9. Hx of glaucoma: - Continue home eye drops: Acular, Pred Forte, Lumigan, Xalantan  10. Depression: Mood seems appropriate on exam - Continue home med: Brintellix 29mg qd 31m Hypothyroidism:  - Continue home med: Levothyroxine 100 mcg qd - Will re-check TSH in the am.  FEN/GI: Heart healthy carb-modified diet, MIVFs: D5 NS at 100 ml/hr, bowel regimen: Miralax 17g bid, Senokot 2 tablets bid. Prophylaxis: Heparin sq  Disposition: Admit to [facility], attending Dr. Eniola. AGwendlyn Deutscherate home today or tomorrow, pending clinical improvement.  History of Present Illness:  [name].[facility] [place]69e [place] [facility] Pt is demented, so he cannot provide an accurate history. He does speak English and no interpretor was used. Per report, Pt's doctors have been making adjustments to his insulin regimen lately. They have added prandial Novolog in addition to his Lantus and SSI. Per chart review, his current insulin regimen is Lantus 10 units daily, Novolog 20 units tid  WC, Humalog prn for blood sugars > 200 per sliding scale. At [facility], Pt's blood sugar was found to be 22 and EMS was called. Repeat CBG by EMS 36 and he was given an amp of D50. On arrival to the ED, CBG was 46, and he was given a 2nd amp of D50. His blood sugars eventually trended up to 90 > 84 > 74, and a 3rd amp of D50 was given. At baseline, Pt has some residual right sided  weakness from his stroke in 03/2014. He is usually conversational, but not oriented. Pt states he is lightheaded and thirsty, but has no pain anywhere.  In the ED, he was found to have a mild AKI with a creatinine of 2.44. Baseline creatinine appears to be 1.8-1.9. UA showed many bacteria, large leukocytes, positive nitrites, TNTC WBCs. Pt was afebrile, had a WBC count of 6.1, and denied any dysuria. He was given IV Rocephin x 1.   Review Of Systems: Per HPI with the following additions: Unable to perform ROS, as Pt cannot give a history. Otherwise the remainder of the systems were negative.  Patient Active Problem List   Diagnosis Date Noted  . Hypoglycemia 02/06/2015  . Redness of left eye 02/01/2015  . Glaucoma 02/01/2015  . Hepatitis C infection 01/04/2015  . Person living in residential institution 12/28/2014  . Depression 12/28/2014  . HLD (hyperlipidemia) 06/16/2014  . Peripheral vision loss 06/16/2014  . 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 Chronic kidney disease (CKD), stage IV (severe) ([place]) 05/27/2014  . Essential hypertension   . Type 2 diabetes mellitus with other circulatory complications ([place])   . Congestive dilated cardiomyopathy ([place]) 04/11/2014  . History of cerebrovascular accident with residual effects     Past Medical History: Past Medical History  Diagnosis Date  . Hypertension   . Hypercholesterolemia   . Glaucoma   . Cerebrovascular disease     a. 01/2007 MRI/A: L MCA 72-53%, R MCA 66%, LICA 44%.  . Chronic combined systolic and diastolic CHF, NYHA class 2 ([place])     a. 01/2007 Echo: EF 60-65%;  b. 03/2014 Echo: EF 45-50%, Gr 1 DD, severe LVH.  Marland Kitchen Headache   . Bedridden     transfers to chair  . Pneumonia 10/2006  . Hypothyroidism   . GERD (gastroesophageal reflux disease)   . Stroke ([place]) 01/2007    L corona radiata infarct; "left his right side weak" (09/16/2014)  . Stroke [facility]) 03/2014    "while in hospital;  left side extremely weak since" (09/16/2014)  . Hepatitis dx'd 03/2014    C  . Type II diabetes mellitus (HCC)     Type II  . Depression   . CKD (chronic kidney disease), stage IV ([place])     [facility]  . History of cerebrovascular accident with residual effects     a. 01/2007 MRI/A: L MCA 03-47%, R MCA 42%, LICA 59%.   . Chronic kidney disease (CKD), stage IV (severe) ([place]) 05/27/2014  . Hepatitis C infection 01/04/2015    - Pt with HCV (+) antibody and HCV RNA Quant Log = 5.87 (H) (04/10/14)    Past Surgical History: Past Surgical History  Procedure Laterality Date  . Colonoscopy w/ biopsies and polypectomy  2002  . Colon surgery      Polyps removed- surgically  . Trabeculectomy Left 08/18/2013    Procedure: TRABECULECTOMY WITH TUBE WITH [facility];  Surgeon: [name], MD;  Location: [facility]  OR;  Service: Ophthalmology;  Laterality: Left;  . Colonoscopy  2015  . Cataract extraction extracapsular Left 12/21/2014    Procedure: CATARACT EXTRACTION EXTRACAPSULAR WITH INTRAOCULAR LENS PLACEMENT LEFT EYE;  Surgeon: [name], MD;  Location: [facility];  Service: Ophthalmology;  Laterality: Left;    Social History: Social History  Substance Use Topics  . Smoking status: Never Smoker   . Smokeless tobacco: Never Used  . Alcohol Use: No   Additional social history: None Please also refer to relevant sections of EMR.  Family History: Family History  Problem Relation Age of Onset  . Family history unknown: Yes   Could not obtain from patient.  Allergies and Medications: No Known Allergies Current Facility-Administered Medications on File Prior to Encounter  Medication Dose Route Frequency Provider Last Rate Last Dose  . gatifloxacin (ZYMAXID) 0.5 % ophthalmic drops 1 drop  1 drop Left Eye PRN [name], MD      . phenylephrine (MYDFRIN) 2.5 % ophthalmic solution 1 drop  1 drop Left Eye Once [name], MD       Current Outpatient Prescriptions on File Prior to Encounter   Medication Sig Dispense Refill  . amLODipine (NORVASC) 10 MG tablet Take 1 tablet (10 mg total) by mouth daily.    Marland Kitchen atorvastatin (LIPITOR) 80 MG tablet Take 1 tablet (80 mg total) by mouth daily at 6 PM.    . bimatoprost (LUMIGAN) 0.01 % SOLN Place 1 drop into the right eye at bedtime.    . carvedilol (COREG) 12.5 MG tablet Take 1 tablet (12.5 mg total) by mouth 2 (two) times daily with a meal.    . clopidogrel (PLAVIX) 75 MG tablet Take 1 tablet (75 mg total) by mouth daily.    . furosemide (LASIX) 40 MG tablet Take 3 tablets (120 mg total) by mouth 2 (two) times daily. 30 tablet   . gabapentin (NEURONTIN) 100 MG capsule Take 100 mg by mouth 3 (three) times daily.     Marland Kitchen glucagon (GLUCAGON EMERGENCY) 1 MG injection Inject 1 mg into the vein once as needed (hypoglycemia).     . insulin glargine (LANTUS) 100 UNIT/ML injection Inject 10 Units into the skin daily.     . insulin lispro (HUMALOG) 100 UNIT/ML cartridge Inject 5 Units into the skin 3 (three) times daily with meals. Use PRN for blood sugar above 200   151-200 2 units;  201-250 3 units;  251-300 5 units;  301-350 8 units;  351-400 10 units :  BS ABOVE 400 - CALL MD    . ketorolac (ACULAR) 0.4 % SOLN Place 1 drop into the left eye 4 (four) times daily.    Marland Kitchen levothyroxine (SYNTHROID, LEVOTHROID) 100 MCG tablet Take 100 mcg by mouth daily.    . NON FORMULARY 120 mLs 3 (three) times daily.    Marland Kitchen OVER THE COUNTER MEDICATION Take 1 scoop by mouth 2 (two) times daily. Protein powder mixed with food/drink (25 g of protein per serving) PURE PROTEIN    . polyethylene glycol (MIRALAX / GLYCOLAX) packet Take 17 g by mouth 2 (two) times daily.    . prednisoLONE acetate (PRED FORTE) 1 % ophthalmic suspension Place 1 drop into the left eye 4 (four) times daily.    . sennosides-docusate sodium (SENOKOT-S) 8.6-50 MG tablet Take 2 tablets by mouth 2 (two) times daily.     . tamsulosin (FLOMAX) 0.4 MG CAPS capsule Take 0.8 mg by mouth at bedtime.     .  Vortioxetine  HBr (BRINTELLIX) 10 MG TABS Take 10 mg by mouth daily.     Marland Kitchen zinc sulfate 220 MG capsule Take 220 mg by mouth daily.      Objective: BP 151/89 mmHg  Pulse 67  Temp(Src) 97.4 F (36.3 C) (Oral)  Resp 11  SpO2 100% Exam: General: Thin, tired-appearing male, laying in bed, in NAD, answers most questions. HEENT: [place]/AT, EOMI, no scleral icterus, MMM Neck: Supple Cardiovascular: RRR, no murmurs, 2+ DP pulses bilaterally Respiratory: CTAB, no wheezes, no crackles  Abdomen: +BS, soft, non-tender, non-distended MSK: No edema, heel floats in place. Skin: Warm and dry without lesions or rashes. No skin breakdown noted.  Neuro: Awake, alert, oriented to person and place. Not situation. Psych: Appropriate mood and behavior.  Labs and Imaging: CBC BMET   Recent Labs Lab 02/06/15 0051  WBC 6.1  HGB 10.2*  HCT 31.1*  PLT 159    Recent Labs Lab 02/06/15 0051  NA 142  K 3.5  CL 104  CO2 28  BUN 61*  CREATININE 2.44*  GLUCOSE 126*  CALCIUM 8.9     CBG: 46 > 189 > 90 > 84 > 74 > 175 > 154 UA: turbid, many bacteria, moderate Hgb, negative ketones, large leukocytes, positive nitrites, TNTC WBC  Sela Hua, MD 02/06/2015, 2:39 AM PGY-1, [place] [facility] pager: [phone], text pages welcome  I have seen and evaluated the patient with Dr. Brett Albino. I am in agreement with the note above in its revised form. My additions are in red.  [place] Dann B. Bonner Puna, MD, PGY-3 02/06/2015 6:51 AM

## 2015-02-06 NOTE — ED Notes (Signed)
CHECKED CBG 46 RN EMILIE

## 2015-02-06 NOTE — ED Notes (Signed)
In and out cath was done to get urine with the help of RN Emilie.

## 2015-02-06 NOTE — Discharge Summary (Signed)
[facility] Discharge Summary  Patient name: [name] Medical record number: [id number] Date of birth: 1945-05-20 Age: 69 y.o. Gender: male Date of Admission: 02/05/2015  Date of Discharge: 02/07/15 Admitting Physician: Kinnie Feil, MD  Primary Care Provider: Cordelia Poche, MD Consultants: none  Indication for Hospitalization: Hypoglycemia  Discharge Diagnoses/Problem List:  Hypoglycemia- due to home insulin regimen  Asymptomatic Bacturia  AKI on CKD IV Hypothyroidism  Hx CVA HTN HLD Combined HF Hepatitis C Hx Glaucoma Depression   Disposition: Back to [facility] today   Discharge Condition: improved   Discharge Exam: per progress note from day of discharge: General: Thin, tired-appearing male, laying in bed, in NAD, answers most questions. HEENT: [place]/AT, EOMI, no scleral icterus, MMM Neck: Supple Cardiovascular: RRR, no murmurs, 2+ DP pulses bilaterally Respiratory: CTAB, no wheezes, no crackles  Abdomen: +BS, soft, non-tender, non-distended MSK: No edema, heel floats in place. Skin: Warm and dry without lesions or rashes. No skin breakdown noted.  Neuro: Awake, alert, oriented to person, but not place or situation. Psych: Appropriate mood and behavior.  Brief Hospital Course:  Hypoglycemia: Patient presented from [place] with hypoglycemia. Pt is demented, so he cannot provide an accurate history. He does speak English and no interpretor was used. Per report, Pt's doctors have been making adjustments to his insulin regimen lately. They have added prandial Novolog in addition to his Lantus and SSI. Per chart review, his current insulin regimen is Lantus 10 units daily, Novolog 20 units tid WC, Humalog prn for blood sugars > 200 per sliding scale. At [facility], Pt's blood sugar was found to be 22 and EMS was called. 3amps of D50 were given. Once admitted inuslin was held and D5 IVF was started until blood glucose  levels rose and stabilized. Once blood glucose levels normalized, patient was started on Lantus 10 units. Patient was not restarted on Novolog.   Asymptomatic Bacturia: UA showed many bacteria, large leukocytes, positive nitrites, too numerous to count WBCs. Patient received Rocephin x1 in ED. Patient denied dysuria. Additionally he was afebrile with normal WBC count. Patient was not started on antibiotics as he was asymptomatic. Blood cultures and urine cultures were collected and were pending at discharge.   AKI on CKD IV:  Creatinine on admission was 2.44. Likely pre-renal given BUN/Cr raio >20:1. Patient was hydrated with IVF as noted above. Creatinine was back to baseline at 1.81 on day of discharge. Home Lasix was held initially and restarted at discharge.   Hypothyroidism:   TSH on admission was 14.397. Synthroid was titrated up to 176mcg.   Patient's other chronic medical issues were stable and managed with home medications.    Issues for Follow Up:  1. Because this Pt is elderly and demented, consider changing his insulin regimen to just Lantus instead of Lantus + Novolog + Humalog. Elderly patients are at increased risk of hypoglycemia and he may not need the tight control that a sliding scale provides. Just being on Lantus will keep his blood sugars low enough to not cause DKA. 2. TSH was elevated to 14. We increased his Levothyroxine from 149mcg qd to 122mcg qd. Please follow TSH closely and titrate dose of Levothyroxine as necessary. 3. Ua was significant for showed many bacteria, large leukocytes, positive nitrites, too numerous to count WBCs. Received Rocephin x 1 in the ED. He denied any dysuria. We did not treat him in the setting of asymptomatic bacturia. Would have low threshold to give antibiotics. Follow up  on urine and blood culture.   Significant Procedures: none  Significant Labs and Imaging:   Recent Labs Lab 02/06/15 0051 02/06/15 0527  WBC 6.1 7.0  HGB 10.2*  9.1*  HCT 31.1* 28.1*  PLT 159 144*    Recent Labs Lab 02/06/15 0051 02/06/15 0527 02/07/15 0456  NA 142 141 136  K 3.5 3.7 3.6  CL 104 104 103  CO2 28 29 27   GLUCOSE 126* 234* 94  BUN 61* 56* 47*  CREATININE 2.44* 2.23* 1.81*  CALCIUM 8.9 8.3* 7.8*  ALKPHOS 132*  --   --   AST 81*  --   --   ALT 72*  --   --   ALBUMIN 3.1*  --   --    Results/Tests Pending at Time of Discharge: Hemoglobin A1c, urine culture, blood culture  Discharge Medications:    Medication List    STOP taking these medications        insulin aspart 100 UNIT/ML injection  Commonly known as:  novoLOG     insulin lispro 100 UNIT/ML cartridge  Commonly known as:  HUMALOG      TAKE these medications        amLODipine 10 MG tablet  Commonly known as:  NORVASC  Take 1 tablet (10 mg total) by mouth daily.     atorvastatin 80 MG tablet  Commonly known as:  LIPITOR  Take 1 tablet (80 mg total) by mouth daily at 6 PM.     BRINTELLIX 10 MG Tabs  Generic drug:  Vortioxetine HBr  Take 10 mg by mouth daily.     carvedilol 12.5 MG tablet  Commonly known as:  COREG  Take 1 tablet (12.5 mg total) by mouth 2 (two) times daily with a meal.     clopidogrel 75 MG tablet  Commonly known as:  PLAVIX  Take 1 tablet (75 mg total) by mouth daily.     furosemide 40 MG tablet  Commonly known as:  LASIX  Take 3 tablets (120 mg total) by mouth 2 (two) times daily.     gabapentin 100 MG capsule  Commonly known as:  NEURONTIN  Take 100 mg by mouth 3 (three) times daily.     GLUCAGON EMERGENCY 1 MG injection  Generic drug:  glucagon  Inject 1 mg into the vein once as needed (hypoglycemia).     insulin glargine 100 UNIT/ML injection  Commonly known as:  LANTUS  Inject 10 Units into the skin daily.     ketorolac 0.4 % Soln  Commonly known as:  ACULAR  Place 1 drop into the left eye 4 (four) times daily.     levothyroxine 112 MCG tablet  Commonly known as:  SYNTHROID, LEVOTHROID  Take 1 tablet (112  mcg total) by mouth daily.     LUMIGAN 0.01 % Soln  Generic drug:  bimatoprost  Place 1 drop into the right eye at bedtime.     NON FORMULARY  120 mLs 3 (three) times daily.     OVER THE COUNTER MEDICATION  Take 1 scoop by mouth 2 (two) times daily. Protein powder mixed with food/drink (25 g of protein per serving) PURE PROTEIN     polyethylene glycol packet  Commonly known as:  MIRALAX / GLYCOLAX  Take 17 g by mouth 2 (two) times daily.     prednisoLONE acetate 1 % ophthalmic suspension  Commonly known as:  PRED FORTE  Place 1 drop into the left eye 4 (four) times daily.  sennosides-docusate sodium 8.6-50 MG tablet  Commonly known as:  SENOKOT-S  Take 2 tablets by mouth 2 (two) times daily.     tamsulosin 0.4 MG Caps capsule  Commonly known as:  FLOMAX  Take 0.8 mg by mouth at bedtime.     zinc sulfate 220 MG capsule  Take 220 mg by mouth daily.        Discharge Instructions: Please refer to Patient Instructions section of EMR for full details.  Patient was counseled important signs and symptoms that should prompt return to medical care, changes in medications, dietary instructions, activity restrictions, and follow up appointments.   Follow-Up Appointments:   Smiley Houseman, MD 02/07/2015, 2:25 PM PGY-1, [place]

## 2015-02-06 NOTE — Clinical Social Work Note (Signed)
CSW received consult from patient who is from [facility] [place].  CSW spoke to [facility] who confirmed they can take patient back once he is medically ready and discharge orders have been received.  CSW to continue to follow patient's progress.  Jones Broom. [place], MSW, [place] 02/06/2015 5:55 PM

## 2015-02-06 NOTE — Progress Notes (Signed)
Brief Progress Note: S: No pain this morning. He feels fine overall. He states breakfast is good.  O: Sitting up in bed, eating breakfast, in NAD. RRR. Lungs clear. +BS, abdomen soft and non-tender.  A/P: Hypoglycemia, resolved - CBGs have been 154 > 192 > 237 > 258 - Will change D5NS to NS at 191ml/hr - CBGs with meals and before bedtime - Will restart Lantus 10mg  daily today.  Asymptomatic bacturia - Will follow-up with urine culture - Low threshold for starting Rocephin if develops UTI sx.

## 2015-02-06 NOTE — ED Notes (Signed)
Pt arrives from [place] via EMS with hypoglycemia, per report there was a recent change in his novolog schedule - adding before meals. SNF not checking CBG appropriately. Fire noted CBG initially 36. 1 amp d50 given in the field. Repeat CBG 176. Wife at bedside and carrying on a conversation with patient in another language. Pt has a hx of stroke, unclear deficits. Baseline talkative but disoriented x4.

## 2015-02-07 DIAGNOSIS — E162 Hypoglycemia, unspecified: Secondary | ICD-10-CM | POA: Diagnosis not present

## 2015-02-07 LAB — BASIC METABOLIC PANEL
ANION GAP: 6 (ref 5–15)
BUN: 47 mg/dL — ABNORMAL HIGH (ref 6–20)
CO2: 27 mmol/L (ref 22–32)
Calcium: 7.8 mg/dL — ABNORMAL LOW (ref 8.9–10.3)
Chloride: 103 mmol/L (ref 101–111)
Creatinine, Ser: 1.81 mg/dL — ABNORMAL HIGH (ref 0.61–1.24)
GFR calc Af Amer: 43 mL/min — ABNORMAL LOW (ref >60)
GFR calc non Af Amer: 37 mL/min — ABNORMAL LOW (ref >60)
GLUCOSE: 94 mg/dL (ref 65–99)
POTASSIUM: 3.6 mmol/L (ref 3.5–5.1)
Sodium: 136 mmol/L (ref 135–145)

## 2015-02-07 LAB — GLUCOSE, CAPILLARY
GLUCOSE-CAPILLARY: 162 mg/dL — AB (ref 65–99)
Glucose-Capillary: 81 mg/dL (ref 65–99)
Glucose-Capillary: 119 mg/dL — ABNORMAL HIGH (ref 65–99)

## 2015-02-07 MED ORDER — LEVOTHYROXINE SODIUM 112 MCG PO TABS: 112.0000 ug | ORAL_TABLET | Freq: Every day | ORAL | Status: DC

## 2015-02-07 NOTE — [place] FL2 (Signed)
[place] MEDICAID FL2 LEVEL OF CARE SCREENING TOOL     IDENTIFICATION  Patient Name: [name] Birthdate: [birth year] Sex: male Admission Date (Current Location): 02/05/2015  [facility] and [place] Number: Herbalist and Address:  [facility], [place] [address] [place], [place] [address]      Joban Colledge Number: [id number]  Attending Physician Name and Address:  Kinnie Feil, MD  Relative Name and Phone Number:  Dairo Hemric  Daughter [phone]    Current Level of Care: Hospital Recommended Level of Care: [place] Prior Approval Number:    Date Approved/Denied:   PASRR Number: [id number]  Discharge Plan: SNF    Current Diagnoses: Patient Active Problem List   Diagnosis Date Noted  . Hypoglycemia 02/06/2015  . UTI (lower urinary tract infection)   . AKI (acute kidney injury) ([place])   . Type 2 diabetes mellitus with hypoglycemia without coma, with long-term current use of insulin ([place])   . Redness of left eye 02/01/2015  . Glaucoma 02/01/2015  . Hepatitis C infection 01/04/2015  . Person living in residential institution 12/28/2014  . Depression 12/28/2014  . HLD (hyperlipidemia) 06/16/2014  . Peripheral vision loss 06/16/2014  . 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 Chronic kidney disease (CKD), stage IV (severe) ([place]) 05/27/2014  . Essential hypertension   . Type 2 diabetes mellitus with other circulatory complications ([place])   . Congestive dilated cardiomyopathy ([place]) 04/11/2014  . History of cerebrovascular accident with residual effects     Orientation ACTIVITIES/SOCIAL BLADDER RESPIRATION    Self, Situation, Place    Incontinent Normal  BEHAVIORAL SYMPTOMS/MOOD NEUROLOGICAL BOWEL NUTRITION STATUS      Incontinent Diet  PHYSICIAN VISITS COMMUNICATION OF NEEDS Height & Weight Skin    Non-Verbally   132 lbs. Normal          AMBULATORY STATUS  RESPIRATION    Assist extensive Normal      Personal Care Assistance Level of Assistance  Dressing, Bathing Bathing Assistance: Maximum assistance   Dressing Assistance: Maximum assistance Total Care Assistance: Maximum assistance    Functional Limitations Info  Speech     Speech Info: Impaired       SPECIAL CARE FACTORS FREQUENCY                      Additional Factors Info  Code Status, Insulin Sliding Scale Code Status Info: Full     Insulin Sliding Scale Info: 3x a day       Current Medications (02/07/2015): Current Facility-Administered Medications  Medication Dose Route Frequency Shakelia Scrivner Last Rate Last Dose  . amLODipine (NORVASC) tablet 10 mg  10 mg Oral Daily Sela Hua, MD   10 mg at 02/07/15 1033  . atorvastatin (LIPITOR) tablet 80 mg  80 mg Oral q1800 Sela Hua, MD   80 mg at 02/06/15 1725  . carvedilol (COREG) tablet 12.5 mg  12.5 mg Oral BID WC Sela Hua, MD   12.5 mg at 02/07/15 0800  . clopidogrel (PLAVIX) tablet 75 mg  75 mg Oral Daily Sela Hua, MD   75 mg at 02/07/15 1033  . gabapentin (NEURONTIN) capsule 100 mg  100 mg Oral TID Sela Hua, MD   100 mg at 02/07/15 1033  . heparin injection 5,000 Units  5,000 Units Subcutaneous 3 times per day Sela Hua, MD   5,000 Units at 02/07/15 [phone]  .  insulin aspart (novoLOG) injection 0-9 Units  0-9 Units Subcutaneous TID WC Elberta Leatherwood, MD   5 Units at 02/06/15 1732  . insulin glargine (LANTUS) injection 10 Units  10 Units Subcutaneous Daily Sela Hua, MD   10 Units at 02/07/15 1034  . ketorolac (ACULAR) 0.5 % ophthalmic solution 1 drop  1 drop Left Eye TID AC & HS Sela Hua, MD   1 drop at 02/07/15 0800  . latanoprost (XALATAN) 0.005 % ophthalmic solution 1 drop  1 drop Right Eye QHS Sela Hua, MD   1 drop at 02/06/15 2307  . levothyroxine (SYNTHROID, LEVOTHROID) tablet 100 mcg  100 mcg Oral QAC breakfast Sela Hua, MD   100 mcg at 02/07/15 0800  .  polyethylene glycol (MIRALAX / GLYCOLAX) packet 17 g  17 g Oral BID Sela Hua, MD   17 g at 02/07/15 1033  . prednisoLONE acetate (PRED FORTE) 1 % ophthalmic suspension 1 drop  1 drop Left Eye TID AC & HS Sela Hua, MD   1 drop at 02/07/15 0800  . senna-docusate (Senokot-S) tablet 2 tablet  2 tablet Oral BID Sela Hua, MD   2 tablet at 02/07/15 1033  . tamsulosin (FLOMAX) capsule 0.8 mg  0.8 mg Oral QHS Sela Hua, MD   0.8 mg at 02/06/15 2300  . Vortioxetine HBr (BRINTELLIX) 20 MG tablet 10 mg  10 mg Oral Daily Sela Hua, MD   10 mg at 02/07/15 1033   Facility-Administered Medications Ordered in Other Encounters  Medication Dose Route Frequency Miqueas Whilden Last Rate Last Dose  . gatifloxacin (ZYMAXID) 0.5 % ophthalmic drops 1 drop  1 drop Left Eye PRN [name], MD      . phenylephrine (MYDFRIN) 2.5 % ophthalmic solution 1 drop  1 drop Left Eye Once [name], MD       Do not use this list as official medication orders. Please verify with discharge summary.  Discharge Medications:   Medication List    STOP taking these medications        insulin aspart 100 UNIT/ML injection  Commonly known as:  novoLOG     insulin lispro 100 UNIT/ML cartridge  Commonly known as:  HUMALOG      TAKE these medications        amLODipine 10 MG tablet  Commonly known as:  NORVASC  Take 1 tablet (10 mg total) by mouth daily.     atorvastatin 80 MG tablet  Commonly known as:  LIPITOR  Take 1 tablet (80 mg total) by mouth daily at 6 PM.     BRINTELLIX 10 MG Tabs  Generic drug:  Vortioxetine HBr  Take 10 mg by mouth daily.     carvedilol 12.5 MG tablet  Commonly known as:  COREG  Take 1 tablet (12.5 mg total) by mouth 2 (two) times daily with a meal.     clopidogrel 75 MG tablet  Commonly known as:  PLAVIX  Take 1 tablet (75 mg total) by mouth daily.     furosemide 40 MG tablet  Commonly known as:  LASIX  Take 3 tablets (120 mg total) by mouth 2 (two) times daily.      gabapentin 100 MG capsule  Commonly known as:  NEURONTIN  Take 100 mg by mouth 3 (three) times daily.     GLUCAGON EMERGENCY 1 MG injection  Generic drug:  glucagon  Inject 1 mg into the vein once as needed (  hypoglycemia).     insulin glargine 100 UNIT/ML injection  Commonly known as:  LANTUS  Inject 10 Units into the skin daily.     ketorolac 0.4 % Soln  Commonly known as:  ACULAR  Place 1 drop into the left eye 4 (four) times daily.     levothyroxine 112 MCG tablet  Commonly known as:  SYNTHROID, LEVOTHROID  Take 1 tablet (112 mcg total) by mouth daily.     LUMIGAN 0.01 % Soln  Generic drug:  bimatoprost  Place 1 drop into the right eye at bedtime.     NON FORMULARY  120 mLs 3 (three) times daily.     OVER THE COUNTER MEDICATION  Take 1 scoop by mouth 2 (two) times daily. Protein powder mixed with food/drink (25 g of protein per serving) PURE PROTEIN     polyethylene glycol packet  Commonly known as:  MIRALAX / GLYCOLAX  Take 17 g by mouth 2 (two) times daily.     prednisoLONE acetate 1 % ophthalmic suspension  Commonly known as:  PRED FORTE  Place 1 drop into the left eye 4 (four) times daily.     sennosides-docusate sodium 8.6-50 MG tablet  Commonly known as:  SENOKOT-S  Take 2 tablets by mouth 2 (two) times daily.     tamsulosin 0.4 MG Caps capsule  Commonly known as:  FLOMAX  Take 0.8 mg by mouth at bedtime.     zinc sulfate 220 MG capsule  Take 220 mg by mouth daily.        Relevant Imaging Results:  Relevant Lab Results:  Recent Labs    Additional Information    Anterhaus, Jones Broom, LCSWA

## 2015-02-07 NOTE — Discharge Planning (Signed)
Patient discharged to SNF in stable condition.  

## 2015-02-07 NOTE — Discharge Instructions (Signed)
You were hospitalized because your blood sugars were low. We have adjusted your insulin regimen to prevent this from happening in the future. Please take Lantus 10 units daily. Your doctors can increase this or start you on Novolog or Humalog in the future if your blood sugars are high. Your thyroid level was low. We have increased your Levothyroxine from 160mcg daily to 112 mcg daily. Your doctors will continue to adjust this as needed.

## 2015-02-07 NOTE — Clinical Social Work Note (Signed)
Patient to be d/c'ed today to [facility].  Patient and family agreeable to plans will transport via ems RN to call report.  Evette Cristal, MSW, [place]

## 2015-02-07 NOTE — Clinical Social Work Note (Signed)
Clinical Social Work Assessment  Patient Details  Name: [name] MRN: [id number] Date of Birth: 07-Apr-1945  Date of referral:  02/07/15               Reason for consult:  Facility Placement                Permission sought to share information with:  Chartered certified accountant granted to share information::  Yes, Verbal Permission Granted  Name::     [name] patient's daughter  Agency::  [facility]  Relationship::     Contact Information:     Housing/Transportation Living arrangements for the past 2 months:  [place] of Information:  Adult Children, Medical Team Patient Interpreter Needed:  None Criminal Activity/Legal Involvement Pertinent to Current Situation/Hospitalization:  No - Comment as needed Significant Relationships:  Adult Children Lives with:  Facility Resident Do you feel safe going back to the place where you live?  Yes Need for family participation in patient care:  Yes (Comment) (Patient is unable to communicate with words, but he understands by nodding yes and shaking head not)  Care giving concerns:  Patient's family did not have any concerns regarding care at [facility].   Social Worker assessment / plan:  Patient is a 69 year old male who resides in [facility].  Patient is nonverbal, but understands what is said to him based on his reactions of nodding and shaking his head no.  Patient has a daughter who is the main contact person and states she would like patient to return back to [facility].  CSW asked patient if he wanted to return back to [facility] and patient nodded his head yes.  Patient's daughter was asked if she had any other questions and she said no.  Patient and daughter are in agreement to going back to [facility].  Employment status:  Retired Nurse, adult PT Recommendations:  Not assessed at this time Information / Referral to community  resources:     Patient/Family's Response to care:  Patient and family agreeable to returning back to [facility].  Patient/Family's Understanding of and Emotional Response to Diagnosis, Current Treatment, and Prognosis:  Patient's daughter aware of patient's prognosis and current treatment plan.  Emotional Assessment Appearance:  Appears stated age Attitude/Demeanor/Rapport:    Affect (typically observed):  Quiet, Stable Orientation:  Oriented to Self, Oriented to Place, Oriented to Situation Alcohol / Substance use:  Not Applicable Psych involvement (Current and /or in the community):     Discharge Needs  Concerns to be addressed:  No discharge needs identified Readmission within the last 30 days:  No Current discharge risk:  None Barriers to Discharge:  No Barriers Identified   Ross Ludwig, LCSWA 02/07/2015, 2:04 PM

## 2015-02-07 NOTE — Progress Notes (Signed)
Family Medicine Teaching Service Daily Progress Note Intern Pager: [phone]  Patient name: [name] Medical record number: [id number] Date of birth: 10-06-1945 Age: 69 y.o. Gender: male  Primary Care Provider: Cordelia Poche, MD Consultants: None Code Status: Full  Pt Overview and Major Events to Date:  11/14: Admitted to FPTS with hypoglycemia and AKI.  Assessment and Plan: 1. Hypoglycemia in T2DM: Last HgbA1c: 7.9%. Current home regimen: Lantus 10 units daily, Novolog 20 units tid WC, Humalog prn for blood sugars > 200 per sliding scale. His AKI may have contributed to decreased insulin clearance, making the hypoglycemia worse. - CBGs have ranged from 81- 289 in the last 24 hours. - Currently on Lantus 10 units. Will discharge him back to [facility] with just Lantus.  2. Asymptomatic bacturia: UA showing many bacteria, large leukocytes, positive nitrites, too numerous to count WBCs.  - Given Rocephin x 1 in the ED. Will hold off on antibiotics for now, as Pt denies dysuria and is afebrile with a normal WBC count. Would have low threshold to reinitiate treatment if any signs/sxs of infection develop. - Urine culture pending - Blood cultures are pending.  3. AKI on CKD stage IV: Creatinine on admission was 2.44. Creatinine back to baseline at 1.81 this morning. Likely pre-renal in etiology, given his BUN/Cr ratio >20:1. - Daily BMETs - Avoid nephrotoxic agents  4. Hypothyroidism: TSH this admission was 14.397. - Takes Levothyroxine 100 mcg qd at home, will titrate up to 161mg. - Recommend re-check and continued titration as an outpatient.  5. Hx CVA: Occurred in 03/2014. Pt has residual right-sided weakness. - Continue home med: Plavix 737mqd  6. HTN; BPs ranging from 108-151/39-70 in the last 24 hours. - Continue home meds: Norvasc 1054md, Carvedilol 12.5mg68m.  7. HLD: Last lipid panel (04/12/14): Chol 268, TG 124, HDL 58, LDL 185. - Continue home med: Lipitor 80mg79m.  8. 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 - Continue Carvedilol 12.5mg q19m Holding home Lasix 120mg b65mn the setting of AKI. Pt does not appear fluid overloaded on exam. Will add back slowly once AKI resolves.  9. Hepatitis C: AST 81, ALT 72, alk phos 132, T bili 0.3. - Will continue to monitor  10. Hx of glaucoma: - Continue home eye drops: Acular, Pred Forte, Lumigan, Xalantan  11. Depression: Mood seems appropriate on exam - Continue home med: Brintellix 10mg qd70mN/GI: Heart healthy carb-modified diet, bowel regimen: Miralax 17g bid, Senokot 2 tablets bid. Prophylaxis: Heparin sq  Disposition: Discharge back to [facility] Subjective:  Pt states he is doing fine this morning. [facility] [place] [facility] [place] [facility]. He denies any pain.  Objective: Temp:  [97.6 F (36.4 C)-99.5 F (37.5 C)] 98.4 F (36.9 C) (11/15 0628) Pulse Rate:  [65-73] 65 (11/15 0628) Resp:  [16] 16 (11/15 0628) BP: (108-151)/(39-70) 151/70 mmHg (11/15 0628) SpO2:  [98 %-100 %] 100 % (11/15 0628) [id number] Exam: General: Thin, tired-appearing male, laying in bed, in NAD, answers most questions. HEENT: [place]/AT, EOMI, no scleral icterus, MMM Neck: Supple Cardiovascular: RRR, no murmurs, 2+ DP pulses bilaterally Respiratory: CTAB, no wheezes, no crackles  Abdomen: +BS, soft, non-tender, non-distended MSK: No edema, heel floats in place. Skin: Warm and dry without lesions or rashes. No skin breakdown noted.  Neuro: Awake, alert, oriented to person, but not place or situation. Psych: Appropriate mood and behavior.  Laboratory:  Recent Labs  Lab 02/06/15 0051 02/06/15 0527  WBC 6.1 7.0  HGB 10.2* 9.1*  HCT 31.1* 28.1*  PLT 159 144*    Recent Labs Lab 02/06/15 0051 02/06/15 0527 02/07/15 0456  NA 142 141 136  K 3.5 3.7 3.6  CL 104 104 103  CO2 '28 29 27  ' BUN 61* 56* 47*  CREATININE 2.44* 2.23* 1.81*  CALCIUM 8.9 8.3* 7.8*   PROT 7.4  --   --   BILITOT 0.3  --   --   ALKPHOS 132*  --   --   ALT 72*  --   --   AST 81*  --   --   GLUCOSE 126* 234* 94    Imaging/Diagnostic Tests: None  Sela Hua, MD 02/07/2015, 7:32 AM PGY-1, [place] Intern pager: [phone], text pages welcome

## 2015-02-08 ENCOUNTER — Telehealth: Payer: Self-pay | Admitting: Family Medicine

## 2015-02-08 LAB — HEMOGLOBIN A1C
Hgb A1c MFr Bld: 8.4 % — ABNORMAL HIGH (ref 4.8–5.6)
Mean Plasma Glucose: 194 mg/dL

## 2015-02-08 LAB — URINE CULTURE

## 2015-02-08 NOTE — Telephone Encounter (Addendum)
Urine culture reviewed. + MRSA and Citrobacter. Patient did not elicit UTI symptoms on admission hence treatment was deferred. Per discharge summary it does not seems he had any symptoms at the time of discharge as well. His blood culture was negative. I discussed patient with Dr Tommy Medal who felt it might be a contaminant. Plan is to re-collect urine for culture. I already spoke with Dr Helaine Chess who intended to follow up on this. I will also forward this note to her to reach  [facility] resident for repeat urine culture.  Discussed with Dr McDiarmid ( [facility] attending). He agreed to obtain new urine culture.

## 2015-02-09 ENCOUNTER — Encounter: Payer: Self-pay | Admitting: Family Medicine

## 2015-02-09 LAB — RAPID HIV SCREEN (HIV 1/2 AB+AG): HIV Screen 4th Generation wRfx: NONREACTIVE

## 2015-02-09 LAB — HEPATITIS B SURFACE ANTIBODY, QUANTITATIVE: Hepatitis B Surf Ab Quant: <3.1 m[IU]/mL — ABNORMAL LOW (ref >9.9)

## 2015-02-11 LAB — CULTURE, BLOOD (ROUTINE X 2)
Culture: NO GROWTH
Culture: NO GROWTH

## 2015-02-13 ENCOUNTER — Encounter: Payer: Self-pay | Admitting: Cardiovascular Disease

## 2015-02-20 ENCOUNTER — Encounter: Payer: Self-pay | Admitting: Cardiovascular Disease

## 2015-02-21 ENCOUNTER — Encounter: Payer: Self-pay | Admitting: Pharmacist

## 2015-02-27 ENCOUNTER — Encounter: Payer: Self-pay | Admitting: Family Medicine

## 2015-02-27 ENCOUNTER — Non-Acute Institutional Stay: Payer: Commercial Managed Care - HMO | Admitting: Family Medicine

## 2015-02-27 DIAGNOSIS — I420 Dilated cardiomyopathy: Secondary | ICD-10-CM | POA: Diagnosis not present

## 2015-02-27 DIAGNOSIS — B182 Chronic viral hepatitis C: Secondary | ICD-10-CM

## 2015-02-27 DIAGNOSIS — E1159 Type 2 diabetes mellitus with other circulatory complications: Secondary | ICD-10-CM

## 2015-02-27 DIAGNOSIS — I10 Essential (primary) hypertension: Secondary | ICD-10-CM

## 2015-02-27 DIAGNOSIS — E785 Hyperlipidemia, unspecified: Secondary | ICD-10-CM | POA: Diagnosis not present

## 2015-02-27 DIAGNOSIS — H5789 Other specified disorders of eye and adnexa: Secondary | ICD-10-CM

## 2015-02-27 DIAGNOSIS — H578 Other specified disorders of eye and adnexa: Secondary | ICD-10-CM

## 2015-02-27 NOTE — Assessment & Plan Note (Signed)
Patient appears euvolemic. On chart review, patient recommended to follow-up with Dr. Johnsie Cancel this month. Continue BP control and furosemide 60mg  BID

## 2015-02-27 NOTE — Assessment & Plan Note (Addendum)
LFTs elevated from years past, however, improved from September. Has an elevated viral load. Will place order for RUQ ultrasound. Refer to infectious disease for management.

## 2015-02-27 NOTE — Assessment & Plan Note (Signed)
Last LDL 185 in January of this year. Patient is not taking atorvastatin 80mg  daily and is adherent with regimen. Will obtain a fasting lipid panel.

## 2015-02-27 NOTE — Progress Notes (Signed)
[name], male   DOB: [birth year], 69 y.o.   MRN: [id number] [name]  Visit  Primary Care Provider: R. Lonny Prude, MD Location of Care: [name] Visit Information: a scheduled routine follow-up visit Patient accompanied by patient Source(s) of information for visit: patient, nursing home and past medical records and Labs from [name]  Chief Complaint:  No chief complaint on file.   Nursing Concerns: None Nutrition Concerns: rapid weight loss   Wound Care Nurse Concerns: Healed left heel pressure ulcer  PT Concerns and Goals: Concerns None;  Patient is not receiving PT/OT;  -  Patient is Intermediate Care Facility status  Family Goals: nursing and custodial care    HISTORY OF PRESENT ILLNESS: Patient initially admitted to [name] because wife was not able to continue to care for him. He was recently admitted to [name] for hypoglycemia. Since discharge from the hospital and readmission to [name], he has been doing well. He reports no complaints when speaking with him.  HYPERTENSION  Disease Monitoring: Blood pressure range-running below 140/90  Chest pain- no      Dyspnea- no  Medications: Compliance- yes Lightheadedness- no   Edema- no   DIABETES  Disease Monitoring: Fasting blood sugars ranging from 80s to 300s. Daytime sugars ranging from 90s to 400. Patient taking Novolog 3u TID with meals. He is adherent with Lantus 8u daily. I do not see that he received blood correction sliding scale Novolog doses for elevated blood sugars.    Polyuria- no  New Visual problems- no change in baseline  Medications: Compliance- Lantus 12 units qhs and SSI  Hypoglycemic symptoms- once on 01/23/15 at 0130 am 60 mg/dL and symptomatic.  Assisted Rescue with glucagon IM.    HYPERLIPIDEMIA  Disease Monitoring: See symptoms for Hypertension  Medications: Compliance- yes RUQ pain- no  Muscle aches- no  ROS See  HPI above   PMH  Social History  Substance Use Topics  . Smoking status: Never Smoker   . Smokeless tobacco: Never Used  . Alcohol Use: No    Outpatient Encounter Prescriptions as of 02/27/2015  Medication Sig  . amLODipine (NORVASC) 10 MG tablet Take 1 tablet (10 mg total) by mouth daily.  Marland Kitchen atorvastatin (LIPITOR) 80 MG tablet Take 1 tablet (80 mg total) by mouth daily at 6 PM.  . bimatoprost (LUMIGAN) 0.01 % SOLN Place 1 drop into the right eye at bedtime.  . carvedilol (COREG) 12.5 MG tablet Take 1 tablet (12.5 mg total) by mouth 2 (two) times daily with a meal.  . clopidogrel (PLAVIX) 75 MG tablet Take 1 tablet (75 mg total) by mouth daily.  . furosemide (LASIX) 40 MG tablet Take 3 tablets (120 mg total) by mouth 2 (two) times daily. (Patient taking differently: Take 60 mg by mouth 2 (two) times daily. )  . gabapentin (NEURONTIN) 100 MG capsule Take 100 mg by mouth 3 (three) times daily.   Marland Kitchen glucagon (GLUCAGON EMERGENCY) 1 MG injection Inject 1 mg into the vein once as needed (hypoglycemia).   . insulin aspart (NOVOLOG FLEXPEN) 100 UNIT/ML FlexPen Inject 3 Units into the skin 3 (three) times daily with meals. Hold if BG <80  . insulin glargine (LANTUS) 100 UNIT/ML injection Inject 8 Units into the skin daily.   Marland Kitchen ketorolac (ACULAR) 0.4 % SOLN Place 1 drop into the left eye 4 (four) times daily.  Marland Kitchen levothyroxine (SYNTHROID, LEVOTHROID) 112 MCG tablet Take 1 tablet (112 mcg total) by mouth daily. (Patient  taking differently: Take 100 mcg by mouth daily. )  . NON FORMULARY 120 mLs 3 (three) times daily.  Marland Kitchen OVER THE COUNTER MEDICATION Take 1 scoop by mouth 2 (two) times daily. Protein powder mixed with food/drink (25 g of protein per serving) PURE PROTEIN  . polyethylene glycol (MIRALAX / GLYCOLAX) packet Take 17 g by mouth 2 (two) times daily.  . prednisoLONE acetate (PRED FORTE) 1 % ophthalmic suspension Place 1 drop into the left eye 4 (four) times daily.  . sennosides-docusate sodium  (SENOKOT-S) 8.6-50 MG tablet Take 2 tablets by mouth 2 (two) times daily.   . tamsulosin (FLOMAX) 0.4 MG CAPS capsule Take 0.8 mg by mouth at bedtime.   . Vortioxetine HBr (BRINTELLIX) 10 MG TABS Take 10 mg by mouth daily.   Marland Kitchen zinc sulfate 220 MG capsule Take 220 mg by mouth daily.   Facility-Administered Encounter Medications as of 02/27/2015  Medication  . gatifloxacin (ZYMAXID) 0.5 % ophthalmic drops 1 drop  . phenylephrine (MYDFRIN) 2.5 % ophthalmic solution 1 drop   No Known Allergies   History Patient Active Problem List   Diagnosis Date Noted  . Hypoglycemia 02/06/2015  . UTI (lower urinary tract infection)   . AKI (acute kidney injury) ([name])   . Type 2 diabetes mellitus with hypoglycemia without coma, with long-term current use of insulin ([name])   . Redness of left eye 02/01/2015  . Glaucoma 02/01/2015  . Hepatitis C infection 01/04/2015  . Person living in residential institution 12/28/2014  . Depression 12/28/2014  . HLD (hyperlipidemia) 06/16/2014  . Peripheral vision loss 06/16/2014  . 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 Chronic kidney disease (CKD), stage IV (severe) ([name]) 05/27/2014  . Essential hypertension   . Type 2 diabetes mellitus with other circulatory complications ([name])   . Congestive dilated cardiomyopathy ([name]) 04/11/2014  . History of cerebrovascular accident with residual effects    Past Medical History  Diagnosis Date  . Hypertension   . Hypercholesterolemia   . Glaucoma   . Cerebrovascular disease     a. 01/2007 MRI/A: L MCA A999333, R MCA A999333, LICA 123456.  . Chronic combined systolic and diastolic CHF, NYHA class 2 ([name])     a. 01/2007 Echo: EF 60-65%;  b. 03/2014 Echo: EF 45-50%, Gr 1 DD, severe LVH.  Marland Kitchen Headache   . Bedridden     transfers to chair  . Pneumonia 10/2006  . Hypothyroidism   . GERD (gastroesophageal reflux disease)   . Stroke ([name]) 01/2007    L corona radiata infarct; "left his right  side weak" (09/16/2014)  . Stroke [facility]) 03/2014    "while in hospital; left side extremely weak since" (09/16/2014)  . Hepatitis dx'd 03/2014    C  . Type II diabetes mellitus (HCC)     Type II  . Depression   . CKD (chronic kidney disease), stage IV ([place])     [facility]  . History of cerebrovascular accident with residual effects     a. 01/2007 MRI/A: L MCA A999333, R MCA [id number], LICA 123456.   . Chronic kidney disease (CKD), stage IV (severe) ([place]) 05/27/2014  . Hepatitis C infection 01/04/2015    - Pt with HCV (+) antibody and HCV RNA Quant Log = 5.87 (H) (04/10/14)   Past Surgical History  Procedure Laterality Date  . Colonoscopy w/ biopsies and polypectomy  2002  . Colon surgery      Polyps removed- surgically  .  Trabeculectomy Left 08/18/2013    Procedure: TRABECULECTOMY WITH TUBE WITH [facility];  Surgeon: [name], MD;  Location: [place];  Service: Ophthalmology;  Laterality: Left;  . Colonoscopy  2015  . Cataract extraction extracapsular Left 12/21/2014    Procedure: CATARACT EXTRACTION EXTRACAPSULAR WITH INTRAOCULAR LENS PLACEMENT LEFT EYE;  Surgeon: [name], MD;  Location: [place];  Service: Ophthalmology;  Laterality: Left;   Family History  Problem Relation Age of Onset  . Family history unknown: Yes    reports that he has never smoked. He has never used smokeless tobacco. He reports that he does not drink alcohol or use illicit drugs.  Basic Activities of Daily Living   ADLs Independent Needs Assistance Dependent  Bathing     Dressing     Ambulation     Toileting     Eating        Instrumental Activities of Daily Living  IADL Independent Needs Assistance Dependent  Cooking   x  Housework   x  Manage Medications   x  Manage the telephone   x  Shopping for food, clothes, Meds, etc   x  Use transportation   x  Manage Finances   x    Falls in the past six months:   no  Diet:  diabetic, - calorie  Nourishment: adequate   Nutritional Supplements:  Medpass:  yes Magic Cup:no  Prostat:yes  Juven:no    Review of Systems  Patient has ability to communicate answers to ROS: yes See HPI  Geriatric Syndromes: Constipation yes ,   Incontinence no  Dizziness no   Syncope no   Skin problems no   Visual Impairment yes   Hearing impairment no  Eating impairment no  Impaired Memory or Cognition no   Behavioral problems no   Sleep problems no   Weight loss no    Pain:  None  Dyspnea: None  PHYSICAL EXAM:. Wt Readings from Last 3 Encounters:  02/27/15 141 lb 14.4 oz (64.365 kg)  01/27/15 132 lb 11.2 oz (60.192 kg)  12/21/14 125 lb (56.7 kg)   Temp Readings from Last 3 Encounters:  02/27/15 97.7 F (36.5 C)   02/07/15 98.4 F (36.9 C) Oral  01/28/15 98 F (36.7 C)    BP Readings from Last 3 Encounters:  02/26/15 130/69  02/07/15 151/70  01/28/15 136/72   Pulse Readings from Last 3 Encounters:  02/26/15 76  02/07/15 65  01/28/15 70   General: alert, cooperative, no distress, well nourished, pleasant, clean, groomed HEENT: No scleral icterus, no nasal secretions, external ear appears normal, Oromucosa moist and no erythema or lesion Neck: Supple, No JVD, no lymphadenopathy [id number] rate and rhythm. Normal S1 and S2. No heart murmurs present. No extra heart sounds. No ankle edema RESP: No resp distress or accessory muscle use. Clear to ausc bilat. No wheezing, no rales, no rhonchi.  ABD: Soft, Non-tender, non-distended, +bowel sounds, no masses MSK: No back pain, no joint pain. No joint swelling or redness EXT: Warm and well perfused no edema, no erythema, pulses WNL Gait: Non ambulatory Skin: heel pressure ulcer scar intact Psych: Orientation oriented to person. Not oriented to place and time. Thought seems slightly disorganized. Speech coherent.  No flowsheet data found. No flowsheet data found.  Years of Education: 16 +  Assessment and Plan:    Congestive dilated cardiomyopathy Patient appears  euvolemic. On chart review, patient recommended to follow-up with Dr. Johnsie Cancel this month. Continue BP control and furosemide 60mg  BID  Hepatitis C infection LFTs elevated from years past, however, improved from September. Has an elevated viral load. Will place order for RUQ ultrasound. Refer to infectious disease for management.  HLD (hyperlipidemia) Last LDL 185 in January of this year. Patient is not taking atorvastatin 80mg  daily and is adherent with regimen. Will obtain a fasting lipid panel.  Type 2 diabetes mellitus with other circulatory complications Patient with previous hypoglycemia that required hospital stay. Patient appears stable. Will continue with   Redness of left eye Resolved.  Essential hypertension Elevated to A999333 systolic. Lisinopril 10mg  started. Will monitor blood pressure. BMP already ordered for creatinine monitoring.    Code Status:    Full Code   Emergency contact:   [place], Fedorov [phone]  [phone]  Address: [address] [place] [address]            [place] Daughter [phone]  [phone]     Follow Up:  Next 60 days unless acute issues arise.

## 2015-02-28 LAB — BASIC METABOLIC PANEL WITH GFR
ANION GAP: 8
BUN / CREAT RATIO: 33 — AB (ref 11–19)
BUN/Creatinine Ratio: 38 — ABNORMAL HIGH
BUN: 69 — AB
BUN: 73 — AB (ref 7–25)
Bicarbonate: 30 (ref 21–31)
Bicarbonate: 30 (ref 21–31)
CALCIUM: 9.1 mg/dL (ref 8.6–10.3)
CHLORIDE: 102 mmol/L (ref 98–107)
Calcium: 8.9 mg/dL
Chloride: 105 mmol/L
Creatinine, Ser: 1.8 — ABNORMAL HIGH
Creatinine, Ser: 2.2 — AB (ref 0.6–1.3)
EGFR (Non-African Amer.): 30 (ref >59)
GFR CALC AF AMER: 36 (ref >59)
GFR CALC AF AMER: 46 — AB
GFR CALC NON AF AMER: 38 — AB
Glucose: 264 — AB (ref 65–99)
Glucose: 82
OSMOLALITY: 304 — AB
Potassium: 3.3 mmol/L — AB (ref 3.5–5.1)
Potassium: 3.5 mmol/L
SODIUM: 143
Sodium: 140 (ref 136–145)

## 2015-02-28 LAB — HEMOGLOBIN A1C: Hemoglobin A1C: 7.7 % — ABNORMAL HIGH (ref 4.1–6.1)

## 2015-03-07 ENCOUNTER — Telehealth: Payer: Self-pay | Admitting: Family Medicine

## 2015-03-07 ENCOUNTER — Encounter (HOSPITAL_COMMUNITY): Payer: Self-pay | Admitting: Radiology

## 2015-03-07 ENCOUNTER — Other Ambulatory Visit (HOSPITAL_COMMUNITY): Payer: Commercial Managed Care - HMO

## 2015-03-07 DIAGNOSIS — R0989 Other specified symptoms and signs involving the circulatory and respiratory systems: Secondary | ICD-10-CM

## 2015-03-07 NOTE — Telephone Encounter (Signed)
Discussed diagnosis of chronic hepatitis C. Wife states she was not aware of diagnosis. Discussed referring patient to infectious disease, which which she agrees. She will also get screened. I discussed low risk of transmission via sexual contact. Discussed most likely mode of transmission is via blood. Will refer patient to infectious disease for management of hepatitis C.

## 2015-03-10 ENCOUNTER — Encounter: Payer: Self-pay | Admitting: Family Medicine

## 2015-03-10 MED ORDER — LISINOPRIL 10 MG PO TABS: 10.0000 mg | ORAL_TABLET | Freq: Every day | ORAL | Status: DC

## 2015-03-10 NOTE — Assessment & Plan Note (Signed)
Resolved

## 2015-03-10 NOTE — Assessment & Plan Note (Signed)
Elevated to A999333 systolic. Lisinopril 10mg  started. Will monitor blood pressure. BMP already ordered for creatinine monitoring.

## 2015-03-10 NOTE — Assessment & Plan Note (Signed)
Patient with previous hypoglycemia that required hospital stay. Patient appears stable. Will continue with

## 2015-03-13 NOTE — Progress Notes (Signed)
[name], male   DOB: 1945-05-31, 69 y.o.   MRN: [id number] I have seen and examined this patient. I have reviewed labs, test..  I have discussed with Dr Lonny Prude.  I agree with the resident's findings, assessment and care plan as detailed in their regulatory visit note.   SH:  History  Smoking status  . Never Smoker   Smokeless tobacco  . Never Used   Past Medical History  Diagnosis Date  . Hypertension   . Hypercholesterolemia   . Glaucoma   . Cerebrovascular disease     a. 01/2007 MRI/A: L MCA A999333, R MCA A999333, LICA 123456.  . Chronic combined systolic and diastolic CHF, NYHA class 2 ([place])     a. 01/2007 Echo: EF 60-65%;  b. 03/2014 Echo: EF 45-50%, Gr 1 DD, severe LVH.  Marland Kitchen Headache   . Bedridden     transfers to chair  . Pneumonia 10/2006  . Hypothyroidism   . GERD (gastroesophageal reflux disease)   . Stroke ([place]) 01/2007    L corona radiata infarct; "left his right side weak" (09/16/2014)  . Stroke [facility]) 03/2014    "while in hospital; left side extremely weak since" (09/16/2014)  . Hepatitis dx'd 03/2014    C  . Type II diabetes mellitus (HCC)     Type II  . Depression   . CKD (chronic kidney disease), stage IV ([place])     [facility]  . History of cerebrovascular accident with residual effects     a. 01/2007 MRI/A: L MCA A999333, R MCA [id number], LICA 123456.   . Chronic kidney disease (CKD), stage IV (severe) ([place]) 05/27/2014  . Hepatitis C infection 01/04/2015    - Pt with HCV (+) antibody and HCV RNA Quant Log = 5.87 (H) (04/10/14)    ROS: no pain, no shortness of breath; All others reviewed and negative   Hypertension is not Controlled Starting low dose Lisinopril with check of BMET 02/24/15 monitoring for excess rise in Cr or K+.  Adequate glycemic control with no hypoglycemic events. Continue current Lantus 8 units daily and Novolog 3 units with each meal.  May add Sliding scale in future if glycemic control remains stable.  No evidence of volume overload  continue Lasix 60 mg BID per oral.

## 2015-03-14 ENCOUNTER — Ambulatory Visit: Payer: Commercial Managed Care - HMO | Admitting: Cardiovascular Disease

## 2015-03-22 ENCOUNTER — Non-Acute Institutional Stay: Payer: Commercial Managed Care - HMO | Admitting: Family Medicine

## 2015-03-22 DIAGNOSIS — R369 Urethral discharge, unspecified: Secondary | ICD-10-CM | POA: Diagnosis not present

## 2015-03-22 DIAGNOSIS — R252 Cramp and spasm: Secondary | ICD-10-CM

## 2015-03-22 NOTE — Progress Notes (Signed)
[facility] Acute visit   HISTORY OF PRESENT ILLNESS: [name] is a 69 y.o. male.    Paged by nursing staff on 03/21/15 for report while changing bedding of penile discharge. Described as yellow/green, and appearing to come out of urethral meatus. At the time patient did not make any complaints (however it is noted by [facility] staff that he does not generally talk with them). Seen and evaluated today. He says that when the discharge came out it was "very" painful, however he has not had any further pain. Denies abdominal pain, burning with urination, and has not had any more discharge. Denies fevers, chills, nausea, vomiting.   Also spoke with him about complaints of leg pain. He has been having leg cramping. He refuses to take his ordered pain medicines because he "takes too many medicines". He would agree to a topical medicine.  History Patient Active Problem List   Diagnosis Date Noted  . Hypoglycemia 02/06/2015  . AKI (acute kidney injury) ([place])   . Glaucoma 02/01/2015  . Hepatitis C infection 01/04/2015  . Person living in residential institution 12/28/2014  . Depression 12/28/2014  . HLD (hyperlipidemia) 06/16/2014  . Peripheral vision loss 06/16/2014  . 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 Chronic kidney disease (CKD), stage IV (severe) ([place]) 05/27/2014  . Essential hypertension   . Type 2 diabetes mellitus with other circulatory complications ([place])   . Congestive dilated cardiomyopathy ([place]) 04/11/2014  . History of cerebrovascular accident with residual effects     Medications   Medication List       This list is accurate as of: 03/22/15  2:47 PM.  Always use your most recent med list.               amLODipine 10 MG tablet  Commonly known as:  NORVASC  Take 1 tablet (10 mg total) by mouth daily.     atorvastatin 80 MG tablet  Commonly known as:  LIPITOR  Take 1 tablet (80 mg total) by mouth daily at 6 PM.      BRINTELLIX 10 MG Tabs  Generic drug:  Vortioxetine HBr  Take 10 mg by mouth daily.     carvedilol 12.5 MG tablet  Commonly known as:  COREG  Take 1 tablet (12.5 mg total) by mouth 2 (two) times daily with a meal.     clopidogrel 75 MG tablet  Commonly known as:  PLAVIX  Take 1 tablet (75 mg total) by mouth daily.     furosemide 40 MG tablet  Commonly known as:  LASIX  Take 1.5 tablets (60 mg total) by mouth 2 (two) times daily.     gabapentin 100 MG capsule  Commonly known as:  NEURONTIN  Take 100 mg by mouth 3 (three) times daily.     GLUCAGON EMERGENCY 1 MG injection  Generic drug:  glucagon  Inject 1 mg into the vein once as needed (hypoglycemia).     insulin glargine 100 UNIT/ML injection  Commonly known as:  LANTUS  Inject 8 Units into the skin daily.     ketorolac 0.4 % Soln  Commonly known as:  ACULAR  Place 1 drop into the left eye 4 (four) times daily.     levothyroxine 112 MCG tablet  Commonly known as:  SYNTHROID, LEVOTHROID  Take 1 tablet (112 mcg total) by mouth daily.     lisinopril 10 MG tablet  Commonly known as:  PRINIVIL,ZESTRIL  Take 1 tablet (10  mg total) by mouth daily.     LUMIGAN 0.01 % Soln  Generic drug:  bimatoprost  Place 1 drop into the right eye at bedtime.     NON FORMULARY  120 mLs 3 (three) times daily.     NOVOLOG FLEXPEN 100 UNIT/ML FlexPen  Generic drug:  insulin aspart  Inject 3 Units into the skin 3 (three) times daily with meals. Hold if BG <80     OVER THE COUNTER MEDICATION  Take 1 scoop by mouth 2 (two) times daily. Protein powder mixed with food/drink (25 g of protein per serving) PURE PROTEIN     polyethylene glycol packet  Commonly known as:  MIRALAX / GLYCOLAX  Take 17 g by mouth 2 (two) times daily.     prednisoLONE acetate 1 % ophthalmic suspension  Commonly known as:  PRED FORTE  Place 1 drop into the left eye 4 (four) times daily.     sennosides-docusate sodium 8.6-50 MG tablet  Commonly known as:   SENOKOT-S  Take 2 tablets by mouth 2 (two) times daily.     tamsulosin 0.4 MG Caps capsule  Commonly known as:  FLOMAX  Take 0.8 mg by mouth at bedtime.     zinc sulfate 220 MG capsule  Take 220 mg by mouth daily.       Filed Vitals:   03/21/15 1736  BP: 138/76    Wt Readings from Last 3 Encounters:  02/27/15 141 lb 14.4 oz (64.365 kg)  01/27/15 132 lb 11.2 oz (60.192 kg)  12/21/14 125 lb (56.7 kg)     Review of Systems:  Per HPI  PHYSICAL EXAM:. General: No acute distress, well nourished, pleasant HEENT:  No scleral icterus, no nasal secretions, Oromucosa moist and no erythema or lesion CV:  RRR, no murmur, no ankle swelling RESP: No resp distress or accessory muscle use.  Clear to ausc bilat. No wheezing, no rales, no rhonchi.  ABD:  Soft, Non-tender, non-distended, +bowel sounds, no masses GU: normal external appearance of penis. There is a large frenulum at the head of the penis. There is no discharge noted, no swelling, redness, or rash. No significant inguinal lymphadenopathy noted. Skin:  No significant skin lesions or rash   Assessment and Plan:   1. Penile discharge UA and urine culture ordered, was collected today 12/28, will follow up with results. He does not have other signs or symptoms of infection. One concerning thought is that he had grown staph aureus in his urine earlier in December, however he was not having any complaints of UTI/pain/infection at that time so it was decided to monitor this.  2. Cramp of both lower extremities Patient declining oral medicines. Will add biofreeze topical TID PRN  Code Status:    Full

## 2015-03-25 ENCOUNTER — Telehealth: Payer: Self-pay | Admitting: Family Medicine

## 2015-03-25 NOTE — Telephone Encounter (Signed)
[place] After Hours Telephone Line  Person calling: Danae Chen from [place] Relationship to patient: Nurse  Chief complaint: urine culture  Received call regarding patient's urine culture. Culture growing enterococcus. Susceptible to bactrim. Discussed with [place] resident, Dr. Lamar Benes and attending Dr. Mingo Amber and agreed to start Bactrim DS 1 tab BID x5 days for UTI. Patient is well, afebrile and asymptomatic with some purulent penile discharge present. If discharge continues after treatment, may need to consider gonorrhea/chlamydia testing if not already done.  Cordelia Poche, MD PGY-3, [facility] 03/25/2015, 2:10 PM

## 2015-03-30 ENCOUNTER — Encounter: Payer: Self-pay | Admitting: Pharmacist

## 2015-04-04 ENCOUNTER — Encounter: Payer: Self-pay | Admitting: Family Medicine

## 2015-04-04 NOTE — Progress Notes (Signed)
[name], male   DOB: July 14, 1945, 70 y.o.   MRN: [id number] I discussed the patient's case with  Dr. Lamar Benes. I have reviewed labs, imaging and findings with them.  I agree with their plans documented in their acute visit note.

## 2015-04-07 ENCOUNTER — Encounter: Payer: Self-pay | Admitting: *Deleted

## 2015-04-07 LAB — BASIC METABOLIC PANEL WITH GFR
BUN / CREAT RATIO: 23 — AB
BUN: 36 — ABNORMAL HIGH
Bicarbonate: 23
CALCIUM: 8.3 mg/dL — AB
CREATININE: 1.6 — AB
Chloride: 104 mmol/L
GFR CALC AF AMER: 52 — AB
Glucose: 373 — ABNORMAL HIGH
Potassium: 4.2 mmol/L
Sodium: 136

## 2015-04-07 LAB — OSMOLALITY: Osmolality: 296 mOsm/kg — ABNORMAL HIGH (ref 268–294)

## 2015-04-11 ENCOUNTER — Non-Acute Institutional Stay: Payer: Medicare Other | Admitting: Family Medicine

## 2015-04-11 DIAGNOSIS — E1159 Type 2 diabetes mellitus with other circulatory complications: Secondary | ICD-10-CM

## 2015-04-11 DIAGNOSIS — I10 Essential (primary) hypertension: Secondary | ICD-10-CM | POA: Diagnosis not present

## 2015-04-11 DIAGNOSIS — E785 Hyperlipidemia, unspecified: Secondary | ICD-10-CM | POA: Diagnosis not present

## 2015-04-11 DIAGNOSIS — B182 Chronic viral hepatitis C: Secondary | ICD-10-CM

## 2015-04-11 NOTE — Assessment & Plan Note (Signed)
Currently on atorvastatin. Will need to discontinue this as he has active liver disease. This may also be related to history of leg cramping in past.

## 2015-04-11 NOTE — Assessment & Plan Note (Signed)
RUQ ultrasound significant for cholelithiasis and sludge with no evidence of cholecystitis. Liver described as normal. Infectious disease appointment not made yet. Discussed with nurse and she is following up on appointment referral. Will need to continue to avoid hepatotoxic agents.

## 2015-04-11 NOTE — Progress Notes (Signed)
[name], male   DOB: 01/28/1946, 70 y.o.   MRN: [id number] [facility]  Visit  Primary Care Provider: R. Lonny Prude, MD Location of Care: [facility] Visit Information: a scheduled routine follow-up visit Patient accompanied by patient Source(s) of information for visit: patient, nursing home and past medical records and Labs from [facility]  Chief Complaint:  No chief complaint on file.   Nursing Concerns: None Nutrition Concerns: rapid weight loss   Wound Care Nurse Concerns: Healed left heel pressure ulcer  PT Concerns and Goals: Concerns None;  Patient is not receiving PT/OT;  -  Patient is Intermediate Care Facility status  Family Goals: nursing and custodial care    HISTORY OF PRESENT ILLNESS: Patient initially admitted to [facility] because wife was not able to continue to care for him. He was recently admitted to [facility] for hypoglycemia. Since discharge from the hospital and readmission to [facility], he has been doing well. He reports no complaints when speaking with him.  HYPERTENSION  Disease Monitoring: Blood pressure range-running below 140/90  Chest pain- no      Dyspnea- no  Medications: Currently on amlodipine, coreg Compliance- yes Lightheadedness- no   Edema- no   DIABETES  Disease Monitoring: Fasting blood sugars ranging from 90s to 300s. Daytime sugars ranging from 90s to 400. Patient taking Novolog 4u TID with meals, up from 3u. He is adherent with Lantus 8u daily. Currently not receiving SSI.    Polyuria- no  New Visual problems- no change in baseline  Medications: Compliance- Lantus 8 units qd and meal coverage   HYPERLIPIDEMIA  Disease Monitoring: See symptoms for Hypertension  Medications: Compliance- yes RUQ pain- no  Muscle aches- no  ROS See HPI above   PMH  Social History  Substance Use Topics  . Smoking status: Never Smoker   . Smokeless tobacco: Never Used  . Alcohol  Use: No    Outpatient Encounter Prescriptions as of 04/11/2015  Medication Sig  . amLODipine (NORVASC) 10 MG tablet Take 1 tablet (10 mg total) by mouth daily.  Marland Kitchen atorvastatin (LIPITOR) 80 MG tablet Take 1 tablet (80 mg total) by mouth daily at 6 PM.  . bimatoprost (LUMIGAN) 0.01 % SOLN Place 1 drop into the right eye at bedtime.  . carvedilol (COREG) 12.5 MG tablet Take 1 tablet (12.5 mg total) by mouth 2 (two) times daily with a meal.  . clopidogrel (PLAVIX) 75 MG tablet Take 1 tablet (75 mg total) by mouth daily.  . furosemide (LASIX) 40 MG tablet Take 1.5 tablets (60 mg total) by mouth 2 (two) times daily.  Marland Kitchen gabapentin (NEURONTIN) 100 MG capsule Take 100 mg by mouth 3 (three) times daily.   Marland Kitchen glucagon (GLUCAGON EMERGENCY) 1 MG injection Inject 1 mg into the vein once as needed (hypoglycemia).   . insulin aspart (NOVOLOG FLEXPEN) 100 UNIT/ML FlexPen Inject 4 Units into the skin 3 (three) times daily with meals. Hold if BG <80  . insulin glargine (LANTUS) 100 UNIT/ML injection Inject 8 Units into the skin daily.   Marland Kitchen ketorolac (ACULAR) 0.4 % SOLN Place 1 drop into the left eye 4 (four) times daily.  Marland Kitchen levothyroxine (SYNTHROID, LEVOTHROID) 100 MCG tablet Take 100 mcg by mouth daily before breakfast.  . lisinopril (PRINIVIL,ZESTRIL) 10 MG tablet Take 1 tablet (10 mg total) by mouth daily.  . NON FORMULARY Take 120 mLs by mouth 3 (three) times daily.   Marland Kitchen OVER THE COUNTER MEDICATION Take 1 scoop by  mouth 2 (two) times daily. Protein powder mixed with food/drink (25 g of protein per serving) PURE PROTEIN  . polyethylene glycol (MIRALAX / GLYCOLAX) packet Take 17 g by mouth 2 (two) times daily.  . prednisoLONE acetate (PRED FORTE) 1 % ophthalmic suspension Place 1 drop into the left eye 4 (four) times daily.  . sennosides-docusate sodium (SENOKOT-S) 8.6-50 MG tablet Take 2 tablets by mouth 2 (two) times daily.   . tamsulosin (FLOMAX) 0.4 MG CAPS capsule Take 0.8 mg by mouth at bedtime.   .  Vortioxetine HBr (BRINTELLIX) 10 MG TABS Take 10 mg by mouth daily.   Marland Kitchen zinc sulfate 220 MG capsule Take 220 mg by mouth daily.   Facility-Administered Encounter Medications as of 04/11/2015  Medication  . gatifloxacin (ZYMAXID) 0.5 % ophthalmic drops 1 drop  . phenylephrine (MYDFRIN) 2.5 % ophthalmic solution 1 drop   No Known Allergies   History Patient Active Problem List   Diagnosis Date Noted  . Hypoglycemia 02/06/2015  . AKI (acute kidney injury) ([place])   . Glaucoma 02/01/2015  . Hepatitis C infection 01/04/2015  . Person living in residential institution 12/28/2014  . Depression 12/28/2014  . HLD (hyperlipidemia) 06/16/2014  . Peripheral vision loss 06/16/2014  . 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 Chronic kidney disease (CKD), stage IV (severe) ([place]) 05/27/2014  . Essential hypertension   . Type 2 diabetes mellitus with other circulatory complications ([place])   . Congestive dilated cardiomyopathy ([place]) 04/11/2014  . History of cerebrovascular accident with residual effects    Past Medical History  Diagnosis Date  . Hypertension   . Hypercholesterolemia   . Glaucoma   . Cerebrovascular disease     a. 01/2007 MRI/A: L MCA A999333, R MCA A999333, LICA 123456.  . Chronic combined systolic and diastolic CHF, NYHA class 2 ([place])     a. 01/2007 Echo: EF 60-65%;  b. 03/2014 Echo: EF 45-50%, Gr 1 DD, severe LVH.  Marland Kitchen Headache   . Bedridden     transfers to chair  . Pneumonia 10/2006  . Hypothyroidism   . GERD (gastroesophageal reflux disease)   . Stroke ([place]) 01/2007    L corona radiata infarct; "left his right side weak" (09/16/2014)  . Stroke [facility]) 03/2014    "while in hospital; left side extremely weak since" (09/16/2014)  . Hepatitis dx'd 03/2014    C  . Type II diabetes mellitus (HCC)     Type II  . Depression   . CKD (chronic kidney disease), stage IV ([place])     [facility]  . History of cerebrovascular accident with residual  effects     a. 01/2007 MRI/A: L MCA A999333, R MCA [id number], LICA 123456.   . Chronic kidney disease (CKD), stage IV (severe) ([place]) 05/27/2014  . Hepatitis C infection 01/04/2015    - Pt with HCV (+) antibody and HCV RNA Quant Log = 5.87 (H) (04/10/14)   Past Surgical History  Procedure Laterality Date  . Colonoscopy w/ biopsies and polypectomy  2002  . Colon surgery      Polyps removed- surgically  . Trabeculectomy Left 08/18/2013    Procedure: TRABECULECTOMY WITH TUBE WITH [facility];  Surgeon: [name], MD;  Location: [place];  Service: Ophthalmology;  Laterality: Left;  . Colonoscopy  2015  . Cataract extraction extracapsular Left 12/21/2014    Procedure: CATARACT EXTRACTION EXTRACAPSULAR WITH INTRAOCULAR LENS PLACEMENT LEFT EYE;  Surgeon: [name], MD;  Location: [place];  Service: Ophthalmology;  Laterality: Left;   Family History  Problem Relation Age of Onset  . Family history unknown: Yes    reports that he has never smoked. He has never used smokeless tobacco. He reports that he does not drink alcohol or use illicit drugs.  Basic Activities of Daily Living   ADLs Independent Needs Assistance Dependent  Bathing     Dressing     Ambulation     Toileting     Eating        Instrumental Activities of Daily Living  IADL Independent Needs Assistance Dependent  Cooking   x  Housework   x  Manage Medications   x  Manage the telephone   x  Shopping for food, clothes, Meds, etc   x  Use transportation   x  Manage Finances   x    Falls in the past six months:   no  Diet:  diabetic, - calorie  Nourishment: adequate   Nutritional Supplements:  Medpass: yes Magic Cup:no  Prostat:yes  Juven:no    Review of Systems  Patient has ability to communicate answers to ROS: yes See HPI  Geriatric Syndromes: Constipation yes ,   Incontinence no  Dizziness no   Syncope no   Skin problems no   Visual Impairment yes   Hearing impairment no  Eating impairment no  Impaired Memory or  Cognition no   Behavioral problems no   Sleep problems no   Weight loss no    Pain:  None  Dyspnea: None  PHYSICAL EXAM:. Wt Readings from Last 3 Encounters:  02/27/15 141 lb 14.4 oz (64.365 kg)  01/27/15 132 lb 11.2 oz (60.192 kg)  12/21/14 125 lb (56.7 kg)   Temp Readings from Last 3 Encounters:  02/27/15 97.7 F (36.5 C)   02/07/15 98.4 F (36.9 C) Oral  01/28/15 98 F (36.7 C)    BP Readings from Last 3 Encounters:  03/21/15 138/76  02/26/15 130/69  02/07/15 151/70   Pulse Readings from Last 3 Encounters:  02/26/15 76  02/07/15 65  01/28/15 70   General: Lying in bed, no distress HEENT: No scleral icterus, no nasal secretions, external ear appears normal, Oromucosa moist and no erythema or lesion Neck: Supple, No JVD, no lymphadenopathy [id number] rate and rhythm. Normal S1 and S2. No heart murmurs present. No extra heart sounds. No ankle edema RESP: No resp distress or accessory muscle use. Clear to auscultation bilaterally. No wheezing, no rales, no rhonchi.  ABD: Soft, Non-tender, non-distended, +bowel sounds, no masses MSK: No back pain, no joint pain. No joint swelling or redness EXT: Warm and well perfused no edema, no erythema, pulses WNL Gait: Non ambulatory Skin: heel pressure ulcer scar intact  No flowsheet data found. No flowsheet data found.  Years of Education: 16 +  Assessment and Plan:    Hepatitis C infection RUQ ultrasound significant for cholelithiasis and sludge with no evidence of cholecystitis. Liver described as normal. Infectious disease appointment not made yet. Discussed with nurse and she is following up on appointment referral. Will need to continue to avoid hepatotoxic agents.  HLD (hyperlipidemia) Currently on atorvastatin. Will need to discontinue this as he has active liver disease. This may also be related to history of leg cramping in past.   Type 2 diabetes mellitus with other circulatory  complications Novolog increased to 4u TID with meals. Will watch blood sugars with this change.  Essential hypertension Blood pressure controlled. No changes.  Code Status:    Full Code   Emergency contact:   Keldric, Slag[place] [phone]  [phone]  Address: [place] [address]            Gittens,[place] Daughter [phone]  [phone]     Follow Up:  Next 60 days unless acute issues arise.

## 2015-04-11 NOTE — Progress Notes (Signed)
[name], male   DOB: 1945-05-14, 70 y.o.   MRN: [id number]    Cardiology Office Note   Date:  04/11/2015   ID:  [name], DOB 08-15-1945, MRN [id number]  Patient Care Team: Mariel Aloe, MD as PCP - General (Family Medicine) Nolene Ebbs, MD (Internal Medicine) [name], MD as Consulting Physician (Ophthalmology) Jamal Maes, MD as Consulting Physician (Nephrology) Josue Hector, MD as Consulting Physician (Cardiology)    No chief complaint on file.    History of Present Illness: [name] is a 70 y.o. [place] male with a hx of HTN, HL, DM2, CKD stage 3, prior CVA.  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 Patient was evaluated by our service in the hospital in January 2016. At that time, he presented with increasing edema of his lower extremities, worsening abdominal distention and overall generalized weakness. He was noted to be volume overloaded. Echocardiogram demonstrated an EF 45-50% with no regional wall motion abnormalities, severe LVH and mild diastolic dysfunction. He was seen by cardiology. He did have minimally elevated troponin levels. This was felt to be related to chronic kidney disease and acute renal failure. He was not felt to be a candidate for cardiac catheterization given his advanced chronic kidney disease. Of note, baseline creatinine of 1.4 had increased to 3.05 during his admission. He was seen by nephrology. He was placed on appropriate heart failure medications and diuresed. He was also evaluated by neurology. He was diagnosed with acute right hemispheric CVA by brain MRI. Carotid [name] demonstrated bilateral 1-39% ICA stenosis. Records indicate he was to have a follow-up event monitor arranged through cardiology. The patient has never been seen in the office since discharge from the hospital. Patient was again admitted in 6/16 for chest pain. Troponin levels were normal and VQ scan was  low probability for pulmonary embolism. No further workup was pursued. He was not seen by cardiology.  He is followed by Dr. Lorrene Reid for nephrology. I do have a copy of her most recent office note. He is not yet ready for dialysis. The patient is referred back to [name] for surgical clearance. Dr. Venetia Maxon wants to perform cataract surgery.  He is here today with his daughter. He is bedbound. He arrives in a wheelchair. He is sleeping throughout most of the visit today. His daughter provides most of the history. The patient denies any chest discomfort. He denies dyspnea at rest. He denies orthopnea, PND or edema. He denies syncope.   Studies/Reports Reviewed Today:  Carotid [name] 04/13/14 bilat ICA 1-39%  Echo 04/11/14 Severe LVH, EF 45-50%, no RWMA, Gr 1 DD   Past Medical History  Diagnosis Date  . Hypertension   . Hypercholesterolemia   . Glaucoma   . Cerebrovascular disease     a. 01/2007 MRI/A: L MCA A999333, R MCA A999333, LICA 123456.  . Chronic combined systolic and diastolic CHF, NYHA class 2 ([name])     a. 01/2007 Echo: EF 60-65%;  b. 03/2014 Echo: EF 45-50%, Gr 1 DD, severe LVH.  [name] Headache   . Bedridden     transfers to chair  . Pneumonia 10/2006  . Hypothyroidism   . GERD (gastroesophageal reflux disease)   . Stroke ([name]) 01/2007    L corona radiata infarct; "left his right side weak" (09/16/2014)  . Stroke [facility]) 03/2014    "while in hospital; left side extremely weak since" (09/16/2014)  . Hepatitis dx'd 03/2014  C  . Type II diabetes mellitus ([name])     Type II  . Depression   . CKD (chronic kidney disease), stage IV ([name])     [facility]  . History of cerebrovascular accident with residual effects     a. 01/2007 MRI/A: L MCA A999333, R MCA A999333, LICA 123456.   . Chronic kidney disease (CKD), stage IV (severe) ([name]) 05/27/2014  . Hepatitis C infection 01/04/2015    - Pt with HCV (+) antibody and HCV RNA Quant Log = 5.87 (H) (04/10/14)    Past Surgical History  Procedure Laterality  Date  . Colonoscopy w/ biopsies and polypectomy  2002  . Colon surgery      Polyps removed- surgically  . Trabeculectomy Left 08/18/2013    Procedure: TRABECULECTOMY WITH TUBE WITH [facility];  Surgeon: [name], MD;  Location: [name];  Service: Ophthalmology;  Laterality: Left;  . Colonoscopy  2015  . Cataract extraction extracapsular Left 12/21/2014    Procedure: CATARACT EXTRACTION EXTRACAPSULAR WITH INTRAOCULAR LENS PLACEMENT LEFT EYE;  Surgeon: [name], MD;  Location: [name];  Service: Ophthalmology;  Laterality: Left;     Current Outpatient Prescriptions  Medication Sig Dispense Refill  . amLODipine (NORVASC) 10 MG tablet Take 1 tablet (10 mg total) by mouth daily.    [name] atorvastatin (LIPITOR) 80 MG tablet Take 1 tablet (80 mg total) by mouth daily at 6 PM.    . bimatoprost (LUMIGAN) 0.01 % SOLN Place 1 drop into the right eye at bedtime.    . carvedilol (COREG) 12.5 MG tablet Take 1 tablet (12.5 mg total) by mouth 2 (two) times daily with a meal.    . clopidogrel (PLAVIX) 75 MG tablet Take 1 tablet (75 mg total) by mouth daily.    . furosemide (LASIX) 40 MG tablet Take 1.5 tablets (60 mg total) by mouth 2 (two) times daily. 30 tablet   . gabapentin (NEURONTIN) 100 MG capsule Take 100 mg by mouth 3 (three) times daily.     [name] glucagon (GLUCAGON EMERGENCY) 1 MG injection Inject 1 mg into the vein once as needed (hypoglycemia).     . insulin aspart (NOVOLOG FLEXPEN) 100 UNIT/ML FlexPen Inject 4 Units into the skin 3 (three) times daily with meals. Hold if BG <80    . insulin glargine (LANTUS) 100 UNIT/ML injection Inject 8 Units into the skin daily.     [name] ketorolac (ACULAR) 0.4 % SOLN Place 1 drop into the left eye 4 (four) times daily.    [name] levothyroxine (SYNTHROID, LEVOTHROID) 100 MCG tablet Take 100 mcg by mouth daily before breakfast.    . lisinopril (PRINIVIL,ZESTRIL) 10 MG tablet Take 1 tablet (10 mg total) by mouth daily.    . NON FORMULARY Take 120 mLs by mouth 3 (three) times  daily.     [name] OVER THE COUNTER MEDICATION Take 1 scoop by mouth 2 (two) times daily. Protein powder mixed with food/drink (25 g of protein per serving) PURE PROTEIN    . polyethylene glycol (MIRALAX / GLYCOLAX) packet Take 17 g by mouth 2 (two) times daily.    . prednisoLONE acetate (PRED FORTE) 1 % ophthalmic suspension Place 1 drop into the left eye 4 (four) times daily.    . sennosides-docusate sodium (SENOKOT-S) 8.6-50 MG tablet Take 2 tablets by mouth 2 (two) times daily.     . tamsulosin (FLOMAX) 0.4 MG CAPS capsule Take 0.8 mg by mouth at bedtime.     . Vortioxetine HBr ([place])  10 MG TABS Take 10 mg by mouth daily.     [name] zinc sulfate 220 MG capsule Take 220 mg by mouth daily.     No current facility-administered medications for this visit.   Facility-Administered Medications Ordered in Other Visits  Medication Dose Route Frequency Provider Last Rate Last Dose  . gatifloxacin (ZYMAXID) 0.5 % ophthalmic drops 1 drop  1 drop Left Eye PRN [name], MD      . phenylephrine (MYDFRIN) 2.5 % ophthalmic solution 1 drop  1 drop Left Eye Once [name], MD        Allergies:   Review of patient's allergies indicates no known allergies.    Social History:  The patient  reports that he has never smoked. He has never used smokeless tobacco. He reports that he does not drink alcohol or use illicit drugs.   Family History:  The patient's Family history is unknown by patient.    ROS:   Please see the history of present illness.   Review of Systems  All other systems reviewed and are negative.     PHYSICAL EXAM: VS:  There were no vitals taken for this visit.    Wt Readings from Last 3 Encounters:  02/27/15 64.365 kg (141 lb 14.4 oz)  01/27/15 60.192 kg (132 lb 11.2 oz)  12/21/14 56.7 kg (125 lb)     GEN: chronically ill appearing male in a wheelchair in no acute distress HEENT: normal Neck: no JVD, no masses Cardiac:  Normal S1/S2, RRR; no murmur ,  no rubs or gallops,  trace ankle edema   Respiratory:  clear to auscultation bilaterally, no wheezing, rhonchi or rales. GI: soft, nontender, nondistended, + BS MS: no deformity or atrophy Skin: warm and dry  Neuro:  CNs II-XII intact, Strength and sensation are intact Psych: Normal affect   EKG:  03/2014   NSR, HR 72, NSSTTW changes   Recent Labs: 02/06/2015: ALT 72*; Hemoglobin 9.1*; Platelets 144*; TSH 14.397* 03/14/2015: BUN 36*; Creat 1.6*; Potassium 4.2; Sodium 136    Lipid Panel    Component Value Date/Time   CHOL 268* 04/12/2014 0424   TRIG 124 04/12/2014 0424   HDL 58 04/12/2014 0424   CHOLHDL 4.6 04/12/2014 0424   VLDL 25 04/12/2014 0424   LDLCALC 185* 04/12/2014 0424      ASSESSMENT AND PLAN:  He had an admission to the hospital in January 2016 with acute heart failure in the setting of acute on chronic renal failure. This was exacerbated by hypertensive urgency. He has been stable since that time. His echocardiogram did demonstrate an EF of 45-50% but no regional wall motion abnormalities. He has not been felt to be a candidate for cardiac catheterization given his chronic kidney disease.   Chronic Combined Systolic and Diastolic CHF:  Volume stable.  Managed by Nephrology.  I will arrange a FU echo.  If EF worse, will need to consider changing Amlodipine to Hydralazine and Nitrates.   HTN:  Controlled.   Hyperlipidemia:  Continue statin.   Prior CVA:  Continue ASA and Plavix.  No need for event monitor at this time.   Chronic Kidney Disease:  Renal fxn has improved.  FU with Neph.     Medication Changes: Current medicines are reviewed at length with the patient today.  Concerns regarding medicines are as outlined above.  The following changes have been made:   Discontinued Medications   No medications on file   Modified Medications   No medications  on file   New Prescriptions   No medications on file    Labs/ tests ordered today include:   No orders of the defined  types were placed in this encounter.      Jenkins Rouge

## 2015-04-12 ENCOUNTER — Encounter: Payer: Medicare Other | Admitting: Cardiovascular Disease

## 2015-04-13 ENCOUNTER — Telehealth: Payer: Self-pay | Admitting: Cardiovascular Disease

## 2015-04-13 ENCOUNTER — Encounter: Payer: Self-pay | Admitting: Cardiovascular Disease

## 2015-04-13 NOTE — Telephone Encounter (Signed)
error 

## 2015-04-18 ENCOUNTER — Encounter: Payer: Self-pay | Admitting: Family Medicine

## 2015-04-18 LAB — T3, FREE: T3 FREE: 2 pg/mL — AB (ref 2.8–4.5)

## 2015-04-18 LAB — T4, FREE: Free T4: 1.04 ng/dL (ref 0.61–1.12)

## 2015-04-19 NOTE — Progress Notes (Signed)
FMTS Attending Note  I personally saw and evaluated the patient. The plan of care was discussed with the resident team. I agree with the assessment and plan as documented by the resident.   Patient reports no acute complaints. No chest pain/sob/abdominal pain/nausea/emesis. Reports eating well and having regular BM. RN staff reports that patient is refusing insulin at least 50% of the time and that blood sugars have been labile.  1. IDT2DM - AM blood sugars ranging 200-300's, Prandial sugars ranging 100-300, likely due to medication noncompliance. Continue lantus 8 units daily + Novolog 4 units with meals + ISS. Patient counseled on the importance of receiving insulin injections.  2. Hx CVA - No new neurologic complaints, continue Plavix and Lipitor 3. HLD - currently on Lipitor 80 mg, last LFT showed mild bump in AST/ALT (81/72), does have known liver disease from Hepatitis C. Will continue to monitor LFT's. Check INR/LFT's/CMP. If he continues to have a rise in LFT's consider dose de-escalation. Would also consider that given liver disease could LFT's be falsely low due to liver burnout (less likely given normal recent US). 4. Hepatitis C - ID referral has been placed 5. HTN - currently on Coreg. No recent Vital signs. Ordered Vitals Q2 weeks.   Dossie Arbour MD

## 2015-04-24 ENCOUNTER — Encounter: Payer: Self-pay | Admitting: Family Medicine

## 2015-04-24 NOTE — Assessment & Plan Note (Signed)
Novolog increased to 4u TID with meals. Will watch blood sugars with this change.

## 2015-04-24 NOTE — Assessment & Plan Note (Signed)
Blood pressure controlled. No changes 

## 2015-05-02 ENCOUNTER — Encounter: Payer: Self-pay | Admitting: Pharmacist

## 2015-05-17 ENCOUNTER — Other Ambulatory Visit: Payer: Self-pay | Admitting: Family Medicine

## 2015-05-17 MED ORDER — INSULIN GLARGINE 100 UNIT/ML ~~LOC~~ SOLN: 8.0000 [IU] | Freq: Every day | SUBCUTANEOUS | Status: DC

## 2015-05-22 ENCOUNTER — Encounter: Payer: Self-pay | Admitting: Family Medicine

## 2015-05-22 LAB — COMPLETE METABOLIC PANEL WITH GFR
ALK PHOS: 105 U/L — AB (ref 34–104)
ALT: 28 (ref 7–52)
AST: 32 (ref 13–39)
Albumin: 2.6 — ABNORMAL LOW (ref 3.5–5.7)
BICARBONATE: 24 (ref 21–31)
BUN/Creatinine Ratio: 19 (ref 11–19)
BUN: 38 — ABNORMAL HIGH (ref 7–25)
Calcium: 8.1 mg/dL — ABNORMAL LOW (ref 8.6–10.3)
Creat: 2 — ABNORMAL HIGH (ref 0.6–1.3)
GFR CALC AF AMER: 40 — AB (ref >59)
GLUCOSE: 145 — AB (ref 65–99)
OSMOLALITY: 291 (ref 268–294)
Protein: 5.9 — ABNORMAL LOW (ref 6.4–8.9)
Sodium: 140 (ref 136–145)
Total Bilirubin: 0.3 mg/dL (ref 0.3–1.0)

## 2015-05-22 LAB — CBC WITH DIFFERENTIAL
BASOS ABS: 0 (ref 0–0.3)
Basophil %: 0.2 (ref 0–2)
EOS ABS: 0 — AB (ref 0.2–0.8)
Eosinophil %: 0.6 (ref 0–8)
HCT: 29 % — ABNORMAL LOW (ref 45–54)
HEMOGLOBIN (KUC): 9.4 g/dL — AB (ref 14.0–18.0)
LYMPHS PCT: 36.4 (ref 13–48)
MCH: 30.9 (ref 26–35)
MCHC: 32.9 (ref 31–36.5)
MCV: 93.8 (ref 80–100)
MPV: 9 fL (ref 6.5–12.0)
Monocyte #: 0.6 (ref 0.1–1.1)
Monocyte %: 10.6 (ref 2–12)
NEUTROS PCT: 52.2 (ref 40–80)
Neutrophil #: 3 (ref 1.5–7.6)
PLATELET COUNT: 152 (ref 150–450)
RBC: 3.06 — AB (ref 4–6.6)
RDW: 14.1 (ref 11–16)
WBC: 5.7 (ref 4.5–10.8)

## 2015-05-22 LAB — HEPATIC FUNCTION PANEL
ALBUMIN: 2.8 — AB (ref 3.5–5.7)
ALT: 22 (ref 7–52)
AST: 20 U/L (ref 13–39)
Alkaline Phosphatase: 101 (ref 34–104)
BILIRUBIN, DIRECT: 0.1 (ref 0–0.2)
GLOBULIN: 3.4 — AB (ref 3.2–2.9)
TOTAL PROTEIN: 6.2 g/dL — AB (ref 6.4–8.9)
Total Bilirubin: 0.3 mg/dL (ref 0.3–1.0)

## 2015-05-22 LAB — BASIC METABOLIC PANEL WITH GFR
BICARBONATE: 27
BUN: 41 mg/dL — AB (ref 7–25)
CALCIUM: 8.3 mg/dL — AB (ref 8.6–10.3)
Chloride: 109 mmol/L — ABNORMAL HIGH (ref 98–107)
Creat: 1.9 — ABNORMAL HIGH (ref 0.6–1.3)
EGFR (African American): 43 — ABNORMAL LOW (ref >59)
Glucose: 322 — ABNORMAL HIGH (ref 65–99)
OSMOLALITY: 308 — AB (ref 268–294)
Potassium: 3.9 mmol/L (ref 3.5–5.1)
Sodium: 143 (ref 136–145)

## 2015-05-22 LAB — SEDIMENTATION RATE: Sed Rate: 41 mm/hr — ABNORMAL HIGH (ref 0–20)

## 2015-05-22 LAB — HEMOGLOBIN A1C: HEMOGLOBIN A1C: 7.4 — AB (ref 4.1–6.1)

## 2015-05-22 LAB — PROTIME-INR
INR: 1 (ref 0.8–1.2)
PT: 11 (ref 8.4–13.3)

## 2015-05-22 LAB — TSH: TSH: 6.4 — AB (ref 0.34–5.60)

## 2015-05-31 ENCOUNTER — Non-Acute Institutional Stay (INDEPENDENT_AMBULATORY_CARE_PROVIDER_SITE_OTHER): Payer: Medicare Other | Admitting: Family Medicine

## 2015-05-31 DIAGNOSIS — R634 Abnormal weight loss: Secondary | ICD-10-CM

## 2015-05-31 NOTE — Progress Notes (Signed)
[name], male   DOB: 1946-03-01, 70 y.o.   MRN: [id number] [facility] Visit  Nursing concerns: Eating all of meals,eating supplements.  Intermittently refuses care.  Nutrition concerns: Decline in weight of from 140 lbs (04/28/15) to 133 lbs (05/25/15) which is 5% weight loss in a month.  Physical Therpay Concerns: participating in feeding group, participating in restorative eating 6 x a wk.  Participating in ROM exercisees uE and LE  HISTORY OF PRESENT ILLNESS: [name] is a 70 y.o.  male.   Unintentional Weight Loss in Older Adults  Diet Type: No concentrated sweets; regular consistence; Thin  Average meal intake, recent: 50-100% per nutrition notes, 100% per nursing.  Taking Medpass 120 ml bid and a magic cup once a day  Feeding Group: Yes   Fraility: Yes    Medication effects (See dysgeusia ADE; Abxs, AED, topiramate, Antipsychotics, benzodiazapines,  Digoxin, levodopa, metformin, opiates, SS/SNRI, theophylline, caffeine): Yes SSRI, Vortioxetine.   Level of physical activity: Sedentary;  dependent  Polypharmacy (> 4 medications): Yes   Malignancy Hx*: No   Ongoing inflammatory or increased catabolic condition: No   Recent acute Illness: No   Emotional problems, especially depression*: Yes   Alcoholism / Substance Abuse: No   Late-life paranoia: No   Swallowing disorders: No  (Meds associated with dysphagia or dry mouth: Anticholinergics, antihistamines, clonidine, Loop diuretics, Bisphosphonates, Doxycycline, iron, NSAIDs, potassium)Yes  Loop diuretic  Dysgeusia:Yes  (Meds associated with dysgeusia: Allopurinol, ACEI, Abxs, Anticholinergics, antihistamines, CCBs, Levodopa, beta-blockers, spironolactone): Yes Levodopa  Oral factors (e.g., poorly fitting dentures, caries): No   Food insecurity: No   Hyperthyroidism, hypothyroidism, hyperparathyroidism/hypercalcemia, hypoadrenalism:Yes  hypothyroidism  GI Disorders Hx* (Ischemic bowel, IBD,  pancreatic insufficiency, PUD, GERD, celiac disease): Yes GERD  Nausea and/or Vomiting: No  (Meds associated with nausea and vomiting: Abx, bisphosphonates, digoxin, dopamine agonists, metformin, SS/NRIs, Statins, TCAs)   GI/Biliary surgeries: colonoscopy with polypectomy, Hepatitis C virus  Eating problems (e.g., inability to feed self): No   Dental or denture problems: No   Low-salt, low-cholesterol diet: Yes, No concentrated sweets diet   Stones, social problems (e.g., isolation, inability to obtain preferred foods): No eating in feeding group  Cognitive impairment*: Yes Dementia in moderate to severe stage  Immunocompromised: No   Diabetes:Yes ; Control: Fair   Organ Failure (Cardiac, Respiratory, Renal, Liver): Yes, CKD (eGFR 43 mL/min, 04/20/15)    Autoimmune Disorders (RA, SLE, etc): No    Neurologic Conditions (Stroke, Parkinsons, Chronic Pain, Dementia): Yes, CVA with hemiparesis   Symptoms:  General Thirst: No  Fever: No  Night Sweats:No   Rigors: No  Fatigue: No    HEENT Headache (Temporal Arteritis): No  Head cold symptoms: No  Oral sores / bad teeth: No   Cardiovascular Abdominal pain with eating: No  Heart Failure Hx: No  Respiratory Pulmonary Disease Hx: No  Cough: No  Gastrointestinal Indigestion/heartburn: No  Epigastric Pain: No  Nausea and/or Vomiting: No  Melena: No  Hematochezia: No  Abdomina Swelling: No  Bloating: No  Diarrhea: No  Constipation: No  Genitourinary Suprapubic pain: No  Dysuria: No  Frequency: No  Hematologic Anemia History: Yes, normocytic anemia hgb 9.4 (04/06/15) Neuropsychologic Prolonged sadness: Yes   Cancer Screening History  Colorectal Cancer: colonoscopy with polypectomy Prostate Cancer: no known Lung Cancer: no known   20% patients with unintentional weight loss without cause found after investigation Appetitie naturally diminishes with age related to gastric hormone.  Food intake also deminishes  with age due to  decrease activity, lowered basal metabolic rate, and loss muscle mass.   Diagnostic labs or imaging: CMET, LDH,  CBC ,     No single cause of unintentional weight loss found, likely multifactorial - Treat underlying reversible or maximizable causes - Treat depression / anxiety: on Vortioxetine at full dose      History Patient Active Problem List   Diagnosis Date Noted  . Hypoglycemia 02/06/2015  . AKI (acute kidney injury) ([place])   . Glaucoma 02/01/2015  . Hepatitis C infection 01/04/2015  . Person living in residential institution 12/28/2014  . Depression 12/28/2014  . HLD (hyperlipidemia) 06/16/2014  . Peripheral vision loss 06/16/2014  . 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 Chronic kidney disease (CKD), stage IV (severe) ([place]) 05/27/2014  . Essential hypertension   . Type 2 diabetes mellitus with other circulatory complications ([place])   . Congestive dilated cardiomyopathy ([place]) 04/11/2014  . History of cerebrovascular accident with residual effects    There are no diagnoses linked to this encounter.  Medications  Current outpatient prescriptions:  .  amLODipine (NORVASC) 10 MG tablet, Take 1 tablet (10 mg total) by mouth daily., Disp: , Rfl:  .  atorvastatin (LIPITOR) 80 MG tablet, Take 1 tablet (80 mg total) by mouth daily at 6 PM., Disp: , Rfl:  .  bimatoprost (LUMIGAN) 0.01 % SOLN, Place 1 drop into the right eye at bedtime., Disp: , Rfl:  .  carvedilol (COREG) 12.5 MG tablet, Take 1 tablet (12.5 mg total) by mouth 2 (two) times daily with a meal., Disp: , Rfl:  .  clopidogrel (PLAVIX) 75 MG tablet, Take 1 tablet (75 mg total) by mouth daily., Disp: , Rfl:  .  furosemide (LASIX) 40 MG tablet, Take 1.5 tablets (60 mg total) by mouth 2 (two) times daily., Disp: 30 tablet, Rfl:  .  gabapentin (NEURONTIN) 100 MG capsule, Take 100 mg by mouth 3 (three) times daily. , Disp: , Rfl:  .  glucagon (GLUCAGON EMERGENCY)  1 MG injection, Inject 1 mg into the vein once as needed (hypoglycemia). , Disp: , Rfl:  .  insulin aspart (NOVOLOG FLEXPEN) 100 UNIT/ML FlexPen, Inject 4 Units into the skin 3 (three) times daily with meals. Hold if BG <80, Disp: , Rfl:  .  insulin glargine (LANTUS) 100 UNIT/ML injection, Inject 0.08 mLs (8 Units total) into the skin daily., Disp: 10 mL, Rfl: 0 .  ketorolac (ACULAR) 0.4 % SOLN, Place 1 drop into the left eye 4 (four) times daily., Disp: , Rfl:  .  levothyroxine (SYNTHROID, LEVOTHROID) 100 MCG tablet, Take 100 mcg by mouth daily before breakfast., Disp: , Rfl:  .  lisinopril (PRINIVIL,ZESTRIL) 10 MG tablet, Take 1 tablet (10 mg total) by mouth daily., Disp: , Rfl:  .  NON FORMULARY, Take 120 mLs by mouth 3 (three) times daily. , Disp: , Rfl:  .  OVER THE COUNTER MEDICATION, Take 1 scoop by mouth 2 (two) times daily. Protein powder mixed with food/drink (25 g of protein per serving) PURE PROTEIN, Disp: , Rfl:  .  polyethylene glycol (MIRALAX / GLYCOLAX) packet, Take 17 g by mouth 2 (two) times daily., Disp: , Rfl:  .  prednisoLONE acetate (PRED FORTE) 1 % ophthalmic suspension, Place 1 drop into the left eye 4 (four) times daily., Disp: , Rfl:  .  sennosides-docusate sodium (SENOKOT-S) 8.6-50 MG tablet, Take 2 tablets by mouth 2 (two) times daily. , Disp: , Rfl:  .  tamsulosin (  FLOMAX) 0.4 MG CAPS capsule, Take 0.8 mg by mouth at bedtime. , Disp: , Rfl:  .  Vortioxetine HBr (BRINTELLIX) 10 MG TABS, Take 10 mg by mouth daily. , Disp: , Rfl:  .  zinc sulfate 220 MG capsule, Take 220 mg by mouth daily., Disp: , Rfl:  No current facility-administered medications for this visit.  Facility-Administered Medications Ordered in Other Visits:  .  gatifloxacin (ZYMAXID) 0.5 % ophthalmic drops 1 drop, 1 drop, Left Eye, PRN, [name], MD .  phenylephrine (MYDFRIN) 2.5 % ophthalmic solution 1 drop, 1 drop, Left Eye, Once, [name], MD   There were no vitals filed for this visit.  Wt  Readings from Last 3 Encounters:  02/27/15 141 lb 14.4 oz (64.365 kg)  01/27/15 132 lb 11.2 oz (60.192 kg)  12/21/14 125 lb (56.7 kg)     Review of Systems:   General: Denies fevers, chills  Ears/Nose/Throat: Denies nasal congestion.  Cardiovascular: Denies complaints chest pains, palpitations, dyspnea peripheral edema.  Respiratory: Denies cough, dyspnea.  Gastrointestinal: Denies abd pain,, constipation, diarrhea.  Genitourinary: Denies dysuria, urinary frequency  Musculoskeletal: Denies joint pain.  Psychiatric: Denies depression   PHYSICAL EXAM:. Swallow function: Patient able to swallow water thru a straw at bedside with this examiner without wet voice or cough.  General: No acute distress, appears well nourished and hydrated, pleasant, cooperative, sitting in WC HEENT:  No scleral icterus, no nasal secretions, Oromucosa moist and no erythema or lesion, fair dentition, some missing maxillary molars Neck:  Supple, No JVD, no lymphadenopathy CV:  RRR, no murmur, no ankle swelling RESP: No resp distress or accessory muscle use.  Clear to ausc bilat. No wheezing, no rales, no rhonchi.  Skin: no heel ulcers ABD:  Soft, Non-tender, non-distended, +bowel sounds, no masses Neurologic: Decreased vision bilaterally, Awake, following commands. Psych:  Euthymic affect   Assessment and Plan:    Unintentional weight loss - likely multifactorial - Weight was 125 lbs on 12/21/14 and 126 lbs on 09/17/14 - 5 % decline in last month to 133 lbs.  1. Decrease Lasix dose further to 40 mg BID 2. May try trial off of Vortioxetine if weight loss continues with Start of Mirtazapine 3. Check TSH, CBC with Diff, CMET, Retic count: Optional labs in future and Ferritin, ESR/C- reactive protein, vitamin B12, Folate, Vitamin D,  TSH, CXR, KUB.  TSH 6.4 (H) on 04/06/15 4. Continue on trying to get improved glycemic control with insulin dosing.  5. Consult with Nutritionists: ongoing 6. Continue  Nutritional supplements- receiving Medpass, Nutrition increasing Magic cup to twice a day 7. Remove dietary restrictions: remove "No concentrated sweets" diet restriction    8.  Continue assistance with feeding 9.  Continue Meals in social setting 10. provide multivitamin 11. Possible role for Hepatitis C Virus: normal LFTs 04/06/15. Albumin low at 2.6 at same time.      Code Status:     Code Status History    Date Active Date Inactive Code Status Order ID Comments User Context   02/06/2015  3:53 AM 02/07/2015  8:37 PM Full Code [id number]  Sela Hua, MD Inpatient   09/16/2014  4:49 PM 09/17/2014  5:49 PM Full Code [id number]  Oswald Hillock, MD Inpatient   04/09/2014  7:59 PM 04/18/2014 10:17 PM Full Code [id number]  Ripudeep Krystal Eaton, MD Inpatient

## 2015-06-02 ENCOUNTER — Encounter: Payer: Self-pay | Admitting: Family Medicine

## 2015-06-06 ENCOUNTER — Other Ambulatory Visit: Payer: Medicare Other

## 2015-06-09 ENCOUNTER — Other Ambulatory Visit: Payer: Medicare Other

## 2015-06-12 ENCOUNTER — Encounter: Payer: Self-pay | Admitting: Family Medicine

## 2015-06-12 LAB — COMPREHENSIVE METABOLIC PANEL
ALK PHOS: 94 U/L (ref 40–115)
ALT: 17 (ref 9–46)
AST: 18 U/L (ref 10–35)
Albumin: 2.4 — ABNORMAL LOW (ref 3.6–5.1)
BILIRUBIN TOTAL: 0.2 mg/dL (ref 0.2–1.2)
BUN: 51 — ABNORMAL HIGH (ref 7–25)
CALCIUM: 8.4 mg/dL — AB (ref 8.6–10.3)
CO2: 27 mmol/L (ref 20–31)
Chloride: 105 mmol/L (ref 98–110)
Creat: 2.17 — ABNORMAL HIGH (ref 0.70–1.25)
Glucose: 91 (ref 65–99)
POTASSIUM: 4.3 mmol/L (ref 3.5–5.3)
Sodium: 141 (ref 135–146)
Total Protein: 5.9 g/dL — ABNORMAL LOW (ref 6.1–8.1)

## 2015-06-12 LAB — CBC
HEMATOCRIT: 28.5 — AB (ref 39.0–52.0)
Hemoglobin: 9.4 — AB (ref 13.0–17.0)
MCH: 29.7 (ref 26.0–34.0)
MCHC: 33 (ref 30.0–36.0)
MCV: 90.2 (ref 78.0–100.0)
MPV: 10.8 (ref 8.6–12.4)
PLATELET COUNT: 201 (ref 150–400)
RBC: 3.16 — AB (ref 4.22–5.81)
RDW: 14.3 (ref 11.5–15.5)
WBC: 6.5 (ref 4–10.5)

## 2015-06-12 LAB — TSH: TSH: 1.06 mIU/L (ref 0.40–4.50)

## 2015-06-13 ENCOUNTER — Other Ambulatory Visit: Payer: Medicare Other

## 2015-06-13 DIAGNOSIS — B182 Chronic viral hepatitis C: Secondary | ICD-10-CM

## 2015-06-14 LAB — HIV ANTIBODY (ROUTINE TESTING W REFLEX): HIV: NONREACTIVE

## 2015-06-14 LAB — PROTIME-INR
INR: 1.07 (ref <1.50)
PROTHROMBIN TIME: 14 s (ref 11.6–15.2)

## 2015-06-14 LAB — IRON: Iron: 43 ug/dL — ABNORMAL LOW (ref 50–180)

## 2015-06-14 LAB — HEPATITIS A ANTIBODY, TOTAL: HEP A TOTAL AB: NONREACTIVE

## 2015-06-14 LAB — HEPATITIS B CORE ANTIBODY, TOTAL: HEP B C TOTAL AB: NONREACTIVE

## 2015-06-14 LAB — ANA: ANA: NEGATIVE

## 2015-06-15 ENCOUNTER — Non-Acute Institutional Stay: Payer: Medicare Other | Admitting: Family Medicine

## 2015-06-15 DIAGNOSIS — B182 Chronic viral hepatitis C: Secondary | ICD-10-CM

## 2015-06-15 DIAGNOSIS — E1159 Type 2 diabetes mellitus with other circulatory complications: Secondary | ICD-10-CM | POA: Diagnosis not present

## 2015-06-15 DIAGNOSIS — F32A Depression, unspecified: Secondary | ICD-10-CM

## 2015-06-15 DIAGNOSIS — F329 Major depressive disorder, single episode, unspecified: Secondary | ICD-10-CM

## 2015-06-15 DIAGNOSIS — I420 Dilated cardiomyopathy: Secondary | ICD-10-CM

## 2015-06-15 DIAGNOSIS — E039 Hypothyroidism, unspecified: Secondary | ICD-10-CM

## 2015-06-15 DIAGNOSIS — B1910 Unspecified viral hepatitis B without hepatic coma: Secondary | ICD-10-CM | POA: Diagnosis not present

## 2015-06-15 DIAGNOSIS — I10 Essential (primary) hypertension: Secondary | ICD-10-CM | POA: Diagnosis not present

## 2015-06-15 DIAGNOSIS — Z789 Other specified health status: Secondary | ICD-10-CM

## 2015-06-15 NOTE — Progress Notes (Signed)
[name], male   DOB: 1945-12-30, 70 y.o.   MRN: [id number] [facility]  Visit  Primary Care Provider: R. Lonny Prude, MD Location of Care: [facility] Visit Information: a scheduled routine follow-up visit Patient accompanied by patient Source(s) of information for visit: patient, nursing home and past medical records and Labs from [facility]  Chief Complaint:  Chief Complaint  Patient presents with  . Hypertension  . Diabetes  . Dementia    Nursing Concerns: None Nutrition Concerns: rapid weight loss   Wound Care Nurse Concerns: Healed left heel pressure ulcer  PT Concerns and Goals: Concerns None;  Patient is not receiving PT/OT;  -  Patient is Intermediate Care Facility status  Family Goals: nursing and custodial care    HISTORY OF PRESENT ILLNESS: Patient initially admitted to [facility] because wife was not able to continue to care for him. He was recently admitted to [facility] for hypoglycemia. Since discharge from the hospital and readmission to [facility], he has been doing well. He reports no complaints when speaking with him.  HYPERTENSION  Disease Monitoring: Blood pressure range-running below 140/90  Chest pain- no      Dyspnea- no  Medications: Currently on amlodipine, coreg Compliance- yes Lightheadedness- no   Edema- no   DIABETES  Disease Monitoring: Fasting blood sugars ranging from 70s to a max of 510. No hypoglycemia. Patient continues taking Humalog 4u TID with meals, up from 3u. He is adherent with Lantus 8u daily. SSI has been started and appears to be tolerating it well.    Polyuria- no  New Visual problems- no change in baseline  Medications: Compliance- Lantus decreased to Lantus 8 units qd and Humalog blood correction for meal coverage and Humalog sliding scale.   HYPERLIPIDEMIA  Disease Monitoring: See symptoms for Hypertension  Medications: Compliance- yes RUQ pain- no  Muscle  aches- no  ROS See HPI above   PMH  Social History  Substance Use Topics  . Smoking status: Never Smoker   . Smokeless tobacco: Never Used  . Alcohol Use: No    Outpatient Encounter Prescriptions as of 06/15/2015  Medication Sig  . amLODipine (NORVASC) 10 MG tablet Take 1 tablet (10 mg total) by mouth daily.  Marland Kitchen atorvastatin (LIPITOR) 80 MG tablet Take 1 tablet (80 mg total) by mouth daily at 6 PM.  . bimatoprost (LUMIGAN) 0.01 % SOLN Place 1 drop into the right eye at bedtime.  . carvedilol (COREG) 12.5 MG tablet Take 1 tablet (12.5 mg total) by mouth 2 (two) times daily with a meal.  . clopidogrel (PLAVIX) 75 MG tablet Take 1 tablet (75 mg total) by mouth daily.  . furosemide (LASIX) 40 MG tablet Take 1.5 tablets (60 mg total) by mouth 2 (two) times daily.  Marland Kitchen gabapentin (NEURONTIN) 100 MG capsule Take 100 mg by mouth 3 (three) times daily.   Marland Kitchen glucagon (GLUCAGON EMERGENCY) 1 MG injection Inject 1 mg into the vein once as needed (hypoglycemia).   . insulin aspart (NOVOLOG FLEXPEN) 100 UNIT/ML FlexPen Inject 4 Units into the skin 3 (three) times daily with meals. Hold if BG <80  . insulin glargine (LANTUS) 100 UNIT/ML injection Inject 0.08 mLs (8 Units total) into the skin daily.  Marland Kitchen ketorolac (ACULAR) 0.4 % SOLN Place 1 drop into the left eye 4 (four) times daily.  Marland Kitchen levothyroxine (SYNTHROID, LEVOTHROID) 100 MCG tablet Take 100 mcg by mouth daily before breakfast.  . lisinopril (PRINIVIL,ZESTRIL) 10 MG tablet Take 1 tablet (  10 mg total) by mouth daily.  . NON FORMULARY Take 120 mLs by mouth 3 (three) times daily.   Marland Kitchen OVER THE COUNTER MEDICATION Take 1 scoop by mouth 2 (two) times daily. Protein powder mixed with food/drink (25 g of protein per serving) PURE PROTEIN  . polyethylene glycol (MIRALAX / GLYCOLAX) packet Take 17 g by mouth 2 (two) times daily.  . prednisoLONE acetate (PRED FORTE) 1 % ophthalmic suspension Place 1 drop into the left eye 4 (four) times daily.  .  sennosides-docusate sodium (SENOKOT-S) 8.6-50 MG tablet Take 2 tablets by mouth 2 (two) times daily.   . tamsulosin (FLOMAX) 0.4 MG CAPS capsule Take 0.8 mg by mouth at bedtime.   . Vortioxetine HBr (BRINTELLIX) 10 MG TABS Take 10 mg by mouth daily.   Marland Kitchen zinc sulfate 220 MG capsule Take 220 mg by mouth daily.   Facility-Administered Encounter Medications as of 06/15/2015  Medication  . gatifloxacin (ZYMAXID) 0.5 % ophthalmic drops 1 drop  . phenylephrine (MYDFRIN) 2.5 % ophthalmic solution 1 drop   No Known Allergies   History Patient Active Problem List   Diagnosis Date Noted  . Glaucoma 02/01/2015  . Hepatitis C infection 01/04/2015  . Person living in residential institution 12/28/2014  . Depression 12/28/2014  . HLD (hyperlipidemia) 06/16/2014  . Peripheral vision loss 06/16/2014  . 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 Chronic kidney disease (CKD), stage IV (severe) ([place]) 05/27/2014  . Essential hypertension   . Type 2 diabetes mellitus with other circulatory complications ([place])   . Congestive dilated cardiomyopathy ([place]) 04/11/2014  . History of cerebrovascular accident with residual effects    Past Medical History  Diagnosis Date  . Hypertension   . Hypercholesterolemia   . Glaucoma   . Cerebrovascular disease     a. 01/2007 MRI/A: L MCA [id number], R MCA A999333, LICA 123456.  . Chronic combined systolic and diastolic CHF, NYHA class 2 ([place])     a. 01/2007 Echo: EF 60-65%;  b. 03/2014 Echo: EF 45-50%, Gr 1 DD, severe LVH.  Marland Kitchen Headache   . Bedridden     transfers to chair  . Pneumonia 10/2006  . Hypothyroidism   . GERD (gastroesophageal reflux disease)   . Stroke ([place]) 01/2007    L corona radiata infarct; "left his right side weak" (09/16/2014)  . Stroke [facility]) 03/2014    "while in hospital; left side extremely weak since" (09/16/2014)  . Hepatitis dx'd 03/2014    C  . Type II diabetes mellitus (HCC)     Type II  . Depression   . CKD  (chronic kidney disease), stage IV ([place])     [facility]  . History of cerebrovascular accident with residual effects     a. 01/2007 MRI/A: L MCA A999333, R MCA [id number], LICA 123456.   . Chronic kidney disease (CKD), stage IV (severe) ([place]) 05/27/2014  . Hepatitis C infection 01/04/2015    - Pt with HCV (+) antibody and HCV RNA Quant Log = 5.87 (H) (04/10/14)  . Hypoglycemia 02/06/2015  . AKI (acute kidney injury) [facility])    Past Surgical History  Procedure Laterality Date  . Colonoscopy w/ biopsies and polypectomy  2002  . Colon surgery      Polyps removed- surgically  . Trabeculectomy Left 08/18/2013    Procedure: TRABECULECTOMY WITH TUBE WITH [facility];  Surgeon: [name], MD;  Location: [place];  Service: Ophthalmology;  Laterality: Left;  . Colonoscopy  2015  .  Cataract extraction extracapsular Left 12/21/2014    Procedure: CATARACT EXTRACTION EXTRACAPSULAR WITH INTRAOCULAR LENS PLACEMENT LEFT EYE;  Surgeon: [name], MD;  Location: [place];  Service: Ophthalmology;  Laterality: Left;   Family History  Problem Relation Age of Onset  . Family history unknown: Yes    reports that he has never smoked. He has never used smokeless tobacco. He reports that he does not drink alcohol or use illicit drugs.  Basic Activities of Daily Living   ADLs Independent Needs Assistance Dependent  Bathing     Dressing     Ambulation   x  Toileting   x  Eating x       Instrumental Activities of Daily Living  IADL Independent Needs Assistance Dependent  Cooking   x  Housework   x  Manage Medications   x  Manage the telephone   x  Shopping for food, clothes, Meds, etc   x  Use transportation   x  Manage Finances   x    Falls in the past six months:   no  Diet:  diabetic, - calorie  Nourishment: adequate   Nutritional Supplements:  Medpass: yes Magic Cup:no  Prostat:yes  Juven:no    Review of Systems  Patient has ability to communicate answers to ROS: yes See HPI  Geriatric  Syndromes: Constipation yes ,   Incontinence no  Dizziness no   Syncope no   Skin problems no   Visual Impairment yes   Hearing impairment no  Eating impairment no  Impaired Memory or Cognition no   Behavioral problems no   Sleep problems no   Weight loss no    Pain:  None  Dyspnea: None  PHYSICAL EXAM:. Wt Readings from Last 3 Encounters:  05/25/15 133 lb 3.2 oz (60.419 kg)  02/27/15 141 lb 14.4 oz (64.365 kg)  01/27/15 132 lb 11.2 oz (60.192 kg)   Temp Readings from Last 3 Encounters:  06/01/15 98.3 F (36.8 C)   05/27/15 98.6 F (37 C)   02/27/15 97.7 F (36.5 C)    BP Readings from Last 3 Encounters:  06/01/15 138/76  05/27/15 184/78  03/21/15 138/76   Pulse Readings from Last 3 Encounters:  06/01/15 69  05/27/15 84  02/26/15 76   General: Lying in bed, flat, no distress HEENT: No scleral icterus, no nasal secretions, external ear appears normal, Oromucosa moist and no erythema or lesion. Very poor dentition Neck: Supple, No JVD, no lymphadenopathy [id number] rate and rhythm. Normal S1 and S2. No heart murmurs present. No extra heart sounds. No ankle edema RESP: No resp distress or accessory muscle use. Clear to auscultation bilaterally. No wheezing, no rales, no rhonchi.  ABD: Soft, Non-tender, slightly distended, +bowel sounds, no masses MSK:No back pain, no joint pain. No joint swelling or redness EXT: Warm and well perfused no edema, no erythema, pulses WNL Gait: Non ambulatory Skin: healed right heel pressure ulcer Neuro: alert and oriented to person only. Easy to arouse from sleep  No flowsheet data found. No flowsheet data found.  Years of Education: 16 +  Assessment and Plan:    Essential hypertension Blood pressure controlled. No changes.  Type 2 diabetes mellitus with other circulatory complications Daily blood sugar not consistently well controlled. Last A1C at goal. Lantus decreased to 8u daily and sliding scale started.  Anticipate next A1C in July  Hepatitis C infection Patient still awaiting visit with infectious disease. Transaminases stable.  Not immune to hepatitis B virus Will  start hepatitis B series  Congestive dilated cardiomyopathy Some weight gain but patient overall appears euvolemic. Weight gain likely attributed to attempts to purposely increase weight. Will monitor for symptoms of heart failure exacerbation.  Depression Stable. Continue to watch. No medication management  Hypothyroidism TSH stable. Continue Synthroid 15mcg daily.    Code Status:    Full Code   Emergency contact:   [place], [place] [phone]  Address: [place] [address]            Walsh,[place] Daughter [phone]  [phone]     Follow Up:  Next 60 days unless acute issues arise.

## 2015-06-17 LAB — HCV RNA,LIPA RFLX NS5A DRUG RESIST

## 2015-06-17 LAB — HCV RNA, QUANT REAL-TIME PCR W/REFLEX
HCV RNA, PCR, QN (LOG): 5.84 {Log_IU}/mL — AB
HCV RNA, PCR, QN: 691000 [IU]/mL — AB

## 2015-06-22 ENCOUNTER — Encounter: Payer: Self-pay | Admitting: Pharmacist

## 2015-06-26 ENCOUNTER — Telehealth: Payer: Self-pay | Admitting: Pharmacy Technician

## 2015-06-27 ENCOUNTER — Encounter: Payer: Self-pay | Admitting: Family Medicine

## 2015-06-27 DIAGNOSIS — Z789 Other specified health status: Secondary | ICD-10-CM | POA: Insufficient documentation

## 2015-06-27 DIAGNOSIS — E039 Hypothyroidism, unspecified: Secondary | ICD-10-CM | POA: Insufficient documentation

## 2015-06-27 NOTE — Assessment & Plan Note (Signed)
TSH stable. Continue Synthroid 172mcg daily.

## 2015-06-27 NOTE — Assessment & Plan Note (Signed)
Will start hepatitis B series. 

## 2015-06-27 NOTE — Assessment & Plan Note (Addendum)
Blood pressure controlled. No changes 

## 2015-06-27 NOTE — Assessment & Plan Note (Signed)
Stable. Continue to watch. No medication management

## 2015-06-27 NOTE — Assessment & Plan Note (Signed)
Some weight gain but patient overall appears euvolemic. Weight gain likely attributed to attempts to purposely increase weight. Will monitor for symptoms of heart failure exacerbation.

## 2015-06-27 NOTE — Assessment & Plan Note (Addendum)
Daily blood sugar not consistently well controlled. Last A1C at goal. Lantus decreased to 8u daily and sliding scale started. Anticipate next A1C in July

## 2015-06-27 NOTE — Assessment & Plan Note (Signed)
Patient still awaiting visit with infectious disease. Transaminases stable.

## 2015-06-28 NOTE — Progress Notes (Signed)
FMTS Attending Note  I personally saw and evaluated the patient. The plan of care was discussed with the resident team. I agree with the assessment and plan as documented by the resident.   Subjective: Patient has not acute issues. No pain. RN staff noted low blood sugars over the past few days. Patient asymptomatic.   Objective: Vitals reviewed Gen: pleasant male, NAD, sitting in wheelchair Cardiac: RRR, S1 and S2 present, no murmur Resp: CTAB, normal effort Abd: soft, no tenderness, normal bowel sounds Neuro: left grip strength 2/5, left leg flexion 2/5, right UE/LE strength testing 5/5  Assessment and Plan: I agree with the resident assessment and plan with the following additions.   1. Type 2 DM - Decrease lantus to 6 units daily, continue Novolog 4 Units TID  Dossie Arbour MD

## 2015-07-12 ENCOUNTER — Encounter: Payer: Medicare Other | Admitting: Internal Medicine

## 2015-08-04 ENCOUNTER — Encounter: Payer: Self-pay | Admitting: Pharmacist

## 2015-08-23 ENCOUNTER — Encounter: Payer: Self-pay | Admitting: Internal Medicine

## 2015-08-23 ENCOUNTER — Ambulatory Visit (INDEPENDENT_AMBULATORY_CARE_PROVIDER_SITE_OTHER): Payer: Medicare Other | Admitting: Internal Medicine

## 2015-08-23 VITALS — BP 204/111 | HR 87 | Temp 98.1°F | Wt 141.0 lb

## 2015-08-23 DIAGNOSIS — B182 Chronic viral hepatitis C: Secondary | ICD-10-CM | POA: Diagnosis not present

## 2015-08-23 MED ORDER — ELBASVIR-GRAZOPREVIR 50-100 MG PO TABS: 1.0000 | ORAL_TABLET | Freq: Every day | ORAL | Status: DC

## 2015-08-23 NOTE — Patient Instructions (Signed)
Date 08/23/2015  Dear Mr. Dambra, As discussed in the [facility], your hepatitis C therapy will include the following medications:          Zepatier (elbasvir 50 mg/grazoprevir 100 mg) for 12 weeks   Please note that ALL MEDICATIONS WILL START ON THE SAME DATE for a total of 12 weeks. ---------------------------------------------------------------- Your HCV Treatment Start Date: TBA   Your HCV genotype:  1b    Liver Fibrosis: TBD    ---------------------------------------------------------------- YOUR PHARMACY CONTACT:   [place] [facility] [place] Phone: [phone] Hours: Monday to Friday 7:30 am to 6:00 pm   Please always contact your pharmacy at least 3-4 business days before you run out of medications to ensure your next month's medication is ready or 1 week prior to running out if you receive it by mail.  Remember, each prescription is for 28 days. ---------------------------------------------------------------- GENERAL NOTES REGARDING YOUR HEPATITIS C MEDICATION:  ZEPATIER is available as a beige-colored, oval-shaped, film-coated tablet debossed with "770" on one side and plain on the other. Each tablet contains 50 mg elbasvir and 100 mg grazoprevir.  Common side effects of ZEPATIER when used without ribavirin include: - feeling tired -trouble sleeping - headache -diarrhea - nausea  Common side effects of ZEPATIER when used with ribavirin include: - low red blood cell counts (anemia) - feeling irritable - headache - stomach pain - feeling tired - depression - shortness of breath - joint pain - rash or itching  Please note that this only lists the most common side effects and is NOT a comprehensive list of the potential side effects of these medications. For more information, please review the drug information sheets that come with your medication package from the pharmacy.   ---------------------------------------------------------------- GENERAL HELPFUL HINTS ON HCV THERAPY: 1. Stay well-hydrated. 2. Notify the [facility] of any changes in your other over-the-counter/herbal or prescription medications. 3. If you miss a dose of your medication, take the missed dose as soon as you remember. Return to your regular time/dose schedule the next day.  4.  Do not stop taking your medications without first talking with your healthcare provider. 5.  You may take Tylenol (acetaminophen), as long as the dose is less than 2000 mg (OR no more than 4 tablets of the Tylenol Extra Strengths 500mg  tablet) in 24 hours. 6.  You will see our pharmacist-specialist within the first 2 weeks of starting your medication. 7.  You will need to obtain routine labs around week 4 and12 weeks after starting and then 3 to 6 months after finishing Zepatier.   8.  If ribavirin is part of your regimen, you also will have a lab visit within 2 weeks.   Scharlene Gloss, [place] for [place], [place]  [address] [phone]

## 2015-08-23 NOTE — Progress Notes (Signed)
[facility]   CC: consideration for treatment for chronic hepatitis C  HPI:  +[name] is a 70 y.o. male who presents for initial evaluation and management of chronic hepatitis C.  Patient tested positive during hospitalization this year. Hepatitis C-associated risk factors present are: none. Patient denies history of blood transfusion, intranasal drug use, IV drug abuse, sexual contact with person with liver disease, tattoos. Patient has had other studies performed. Results: hepatitis C RNA by PCR, result: positive. Patient has not had prior treatment for Hepatitis C. Patient does not have a past history of liver disease. Patient does not have a family history of liver disease. Patient does not  have associated signs or symptoms related to liver disease.  Labs reviewed and confirm chronic hepatitis C with a positive viral load.   Records reviewed from hospital, PCP, NH.  He has had a stroke and is wheelchair bound.  Also with chronic renal failure, though not on dialysis at this time.  He is interested in hepatitis C treatment.        Patient does not have documented immunity to Hepatitis A. Patient does not have documented immunity to Hepatitis B.    Review of Systems:  Constitutional: negative for fatigue Cardiovascular: negative for chest pain All other systems reviewed and are negative      Past Medical History  Diagnosis Date  . Hypertension   . Hypercholesterolemia   . Glaucoma   . Cerebrovascular disease     a. 01/2007 MRI/A: L MCA A999333, R MCA A999333, LICA 123456.  . Chronic combined systolic and diastolic CHF, NYHA class 2 ([place])     a. 01/2007 Echo: EF 60-65%;  b. 03/2014 Echo: EF 45-50%, Gr 1 DD, severe LVH.  Marland Kitchen Headache   . Bedridden     transfers to chair  . Pneumonia 10/2006  . Hypothyroidism   . GERD (gastroesophageal reflux disease)   . Stroke ([place]) 01/2007    L corona radiata infarct; "left his right side weak" (09/16/2014)  .  Stroke [facility]) 03/2014    "while in hospital; left side extremely weak since" (09/16/2014)  . Hepatitis dx'd 03/2014    C  . Type II diabetes mellitus (HCC)     Type II  . Depression   . CKD (chronic kidney disease), stage IV ([place])      Kidney  . History of cerebrovascular accident with residual effects     a. 01/2007 MRI/A: L MCA A999333, R MCA A999333, LICA 123456.   . Chronic kidney disease (CKD), stage IV (severe) ([place]) 05/27/2014  . Hepatitis C infection 01/04/2015    - Pt with HCV (+) antibody and HCV RNA Quant Log = 5.87 (H) (04/10/14)  . Hypoglycemia 02/06/2015  . AKI (acute kidney injury) ([place])     Prior to Admission medications   Medication Sig Start Date End Date Taking? Authorizing Provider  amLODipine (NORVASC) 10 MG tablet Take 1 tablet (10 mg total) by mouth daily. 04/18/14   Erline Hau, MD  atorvastatin (LIPITOR) 80 MG tablet Take 1 tablet (80 mg total) by mouth daily at 6 PM. 04/18/14   Erline Hau, MD  bimatoprost (LUMIGAN) 0.01 % SOLN Place 1 drop into the right eye at bedtime.    Historical Provider, MD  carvedilol (COREG) 12.5 MG tablet Take 1 tablet (12.5 mg total) by mouth 2 (two) times daily with a meal. 04/18/14   Erline Hau, MD  clopidogrel (  PLAVIX) 75 MG tablet Take 1 tablet (75 mg total) by mouth daily. 04/18/14   Erline Hau, MD  furosemide (LASIX) 40 MG tablet Take 1.5 tablets (60 mg total) by mouth 2 (two) times daily. 03/13/15   Blane Ohara McDiarmid, MD  gabapentin (NEURONTIN) 100 MG capsule Take 100 mg by mouth 3 (three) times daily.  11/20/14   Historical Provider, MD  glucagon (GLUCAGON EMERGENCY) 1 MG injection Inject 1 mg into the vein once as needed (hypoglycemia).     Historical Provider, MD  insulin glargine (LANTUS) 100 UNIT/ML injection Inject 0.08 mLs (8 Units total) into the skin daily. 05/17/15   Olam Idler, MD  insulin lispro (HUMALOG) 100 UNIT/ML injection Inject 5-6 Units into the skin 3 (three)  times daily before meals. BKFST 6 units, lunch 6 units, dinner 5 units    Historical Provider, MD  ketorolac (ACULAR) 0.4 % SOLN Place 1 drop into the left eye 4 (four) times daily.    Historical Provider, MD  levothyroxine (SYNTHROID, LEVOTHROID) 100 MCG tablet Take 100 mcg by mouth daily before breakfast.    Historical Provider, MD  lisinopril (PRINIVIL,ZESTRIL) 10 MG tablet Take 1 tablet (10 mg total) by mouth daily. 03/10/15   Mariel Aloe, MD  NON FORMULARY Take 120 mLs by mouth 3 (three) times daily.     Historical Provider, MD  OVER THE COUNTER MEDICATION Take 1 scoop by mouth 2 (two) times daily. Protein powder mixed with food/drink (25 g of protein per serving) PURE PROTEIN    Historical Provider, MD  polyethylene glycol (MIRALAX / GLYCOLAX) packet Take 17 g by mouth 2 (two) times daily.    Historical Provider, MD  prednisoLONE acetate (PRED FORTE) 1 % ophthalmic suspension Place 1 drop into the left eye 4 (four) times daily.    Historical Provider, MD  sennosides-docusate sodium (SENOKOT-S) 8.6-50 MG tablet Take 2 tablets by mouth 2 (two) times daily.     Historical Provider, MD  tamsulosin (FLOMAX) 0.4 MG CAPS capsule Take 0.8 mg by mouth at bedtime.     Historical Provider, MD  Vortioxetine HBr (BRINTELLIX) 10 MG TABS Take 10 mg by mouth daily.     Historical Provider, MD    No Known Allergies  Social History  Substance Use Topics  . Smoking status: Never Smoker   . Smokeless tobacco: Never Used  . Alcohol Use: No    Family History  Problem Relation Age of Onset  . Family history unknown: Yes     Objective:  Constitutional: in no apparent distress,  Filed Vitals:   08/23/15 1450  BP: 233/115  Pulse: 87  Temp: 98.1 F (36.7 C)   Eyes: anicteric Cardiovascular: Cor RRR Respiratory: CTA B; normal respiratory effort Gastrointestinal: Bowel sounds are normal, liver is not enlarged, spleen is not enlarged Musculoskeletal: no pedal edema noted Skin: negatives: no rash;  no porphyria cutanea tarda Lymphatic: no cervical lymphadenopathy   Laboratory Genotype: No results found for: HCVGENOTYPE HCV viral load:  Lab Results  Component Value Date   HCVQUANT [id number]* 04/10/2014   Lab Results  Component Value Date   WBC 6.5 06/01/2015   HGB 9.1* 02/06/2015   HCT 28.5* 06/01/2015   MCV 90.2 06/01/2015   PLT 144* 02/06/2015    Lab Results  Component Value Date   CREATININE 2.17* 06/01/2015   BUN 51* 06/01/2015   NA 141 06/01/2015   K 4.3 06/01/2015   CL 105 06/01/2015   CO2 27 06/01/2015  Lab Results  Component Value Date   ALT 17 06/01/2015   AST 18 06/01/2015   ALKPHOS 94 06/01/2015     Labs and history reviewed and show CHILD-PUGH A  5-6 points: Child class A 7-9 points: Child class B 10-15 points: Child class C  Lab Results  Component Value Date   INR 1.07 06/13/2015   BILITOT 0.2 06/01/2015   ALBUMIN 2.4* 06/01/2015     Assessment: New Patient with Chronic Hepatitis C genotype 1b, untreated.  I discussed with the patient the lab findings that confirm chronic hepatitis C as well as the natural history and progression of disease including about 30% of people who develop cirrhosis of the liver if left untreated and once cirrhosis is established there is a 2-7% risk per year of liver cancer and liver failure.  I discussed the importance of treatment and benefits in reducing the risk, even if significant liver fibrosis exists.  I also discussed with him the benefits of treatment and lack of benefit depending on life expectancy and he is eager for treament and prolonging life including dialysis if/whe nneeded.    Plan: 1) Patient counseled extensively on limiting acetaminophen to no more than 2 grams daily, avoidance of alcohol. 2) Transmission discussed with patient including sexual transmission, sharing razors and toothbrush.   3) Will need referral to gastroenterology if concern for cirrhosis 4) Will need referral for substance  abuse counseling: No.; Further work up to include urine drug screen  No. 5) Will prescribe Zepatier for 12 weeks due to his underlying renal disease 6) Hepatitis A vaccine Yes.  will offer next appt 7) Hepatitis B vaccine Yes.   8) Pneumovax vaccine if concern for cirrhosis 9) Further work up to include liver staging with elastography 10) NS5A test  Yes.  , is negative for resistance 10) will follow up after starting medication

## 2015-08-31 ENCOUNTER — Emergency Department (HOSPITAL_COMMUNITY): Payer: Medicare Other

## 2015-08-31 ENCOUNTER — Observation Stay (HOSPITAL_COMMUNITY)
Admission: EM | Admit: 2015-08-31 | Discharge: 2015-09-01 | Disposition: A | Discharge status: Skilled Nursing Facility | Payer: Medicare Other | Attending: Family Medicine | Admitting: Family Medicine

## 2015-08-31 ENCOUNTER — Encounter (HOSPITAL_COMMUNITY): Payer: Self-pay

## 2015-08-31 DIAGNOSIS — I5042 Chronic combined systolic (congestive) and diastolic (congestive) heart failure: Secondary | ICD-10-CM | POA: Insufficient documentation

## 2015-08-31 DIAGNOSIS — H409 Unspecified glaucoma: Secondary | ICD-10-CM | POA: Insufficient documentation

## 2015-08-31 DIAGNOSIS — Z8673 Personal history of transient ischemic attack (TIA), and cerebral infarction without residual deficits: Secondary | ICD-10-CM | POA: Insufficient documentation

## 2015-08-31 DIAGNOSIS — I130 Hypertensive heart and chronic kidney disease with heart failure and stage 1 through stage 4 chronic kidney disease, or unspecified chronic kidney disease: Secondary | ICD-10-CM | POA: Diagnosis not present

## 2015-08-31 DIAGNOSIS — F329 Major depressive disorder, single episode, unspecified: Secondary | ICD-10-CM | POA: Diagnosis not present

## 2015-08-31 DIAGNOSIS — N184 Chronic kidney disease, stage 4 (severe): Secondary | ICD-10-CM | POA: Insufficient documentation

## 2015-08-31 DIAGNOSIS — E785 Hyperlipidemia, unspecified: Secondary | ICD-10-CM | POA: Insufficient documentation

## 2015-08-31 DIAGNOSIS — Z7902 Long term (current) use of antithrombotics/antiplatelets: Secondary | ICD-10-CM | POA: Diagnosis not present

## 2015-08-31 DIAGNOSIS — Z794 Long term (current) use of insulin: Secondary | ICD-10-CM | POA: Diagnosis not present

## 2015-08-31 DIAGNOSIS — E1122 Type 2 diabetes mellitus with diabetic chronic kidney disease: Secondary | ICD-10-CM | POA: Diagnosis not present

## 2015-08-31 DIAGNOSIS — H548 Legal blindness, as defined in USA: Secondary | ICD-10-CM | POA: Insufficient documentation

## 2015-08-31 DIAGNOSIS — R531 Weakness: Secondary | ICD-10-CM

## 2015-08-31 DIAGNOSIS — I6990 Unspecified sequelae of unspecified cerebrovascular disease: Secondary | ICD-10-CM

## 2015-08-31 DIAGNOSIS — R4182 Altered mental status, unspecified: Secondary | ICD-10-CM

## 2015-08-31 DIAGNOSIS — B182 Chronic viral hepatitis C: Secondary | ICD-10-CM | POA: Diagnosis not present

## 2015-08-31 DIAGNOSIS — E039 Hypothyroidism, unspecified: Secondary | ICD-10-CM | POA: Diagnosis not present

## 2015-08-31 DIAGNOSIS — Z79899 Other long term (current) drug therapy: Secondary | ICD-10-CM | POA: Insufficient documentation

## 2015-08-31 DIAGNOSIS — N179 Acute kidney failure, unspecified: Secondary | ICD-10-CM | POA: Insufficient documentation

## 2015-08-31 DIAGNOSIS — R55 Syncope and collapse: Secondary | ICD-10-CM

## 2015-08-31 DIAGNOSIS — N390 Urinary tract infection, site not specified: Secondary | ICD-10-CM | POA: Insufficient documentation

## 2015-08-31 HISTORY — DX: Altered mental status, unspecified: R41.82

## 2015-08-31 LAB — CBC
HCT: 27.8 % — ABNORMAL LOW (ref 39.0–52.0)
Hemoglobin: 8.9 g/dL — ABNORMAL LOW (ref 13.0–17.0)
MCH: 28.9 pg (ref 26.0–34.0)
MCHC: 32 g/dL (ref 30.0–36.0)
MCV: 90.3 fL (ref 78.0–100.0)
PLATELETS: 133 10*3/uL — AB (ref 150–400)
RBC: 3.08 MIL/uL — AB (ref 4.22–5.81)
RDW: 14.6 % (ref 11.5–15.5)
WBC: 7.1 10*3/uL (ref 4.0–10.5)

## 2015-08-31 LAB — URINALYSIS, ROUTINE W REFLEX MICROSCOPIC
BILIRUBIN URINE: NEGATIVE
GLUCOSE, UA: NEGATIVE mg/dL
KETONES UR: NEGATIVE mg/dL
NITRITE: POSITIVE — AB
Specific Gravity, Urine: 1.013 (ref 1.005–1.030)
pH: 6 (ref 5.0–8.0)

## 2015-08-31 LAB — COMPREHENSIVE METABOLIC PANEL
ALK PHOS: 102 U/L (ref 38–126)
ALT: 26 U/L (ref 17–63)
AST: 25 U/L (ref 15–41)
Albumin: 2.4 g/dL — ABNORMAL LOW (ref 3.5–5.0)
Anion gap: 5 (ref 5–15)
BILIRUBIN TOTAL: 0.4 mg/dL (ref 0.3–1.2)
BUN: 44 mg/dL — ABNORMAL HIGH (ref 6–20)
CALCIUM: 8.4 mg/dL — AB (ref 8.9–10.3)
CO2: 24 mmol/L (ref 22–32)
CREATININE: 2.99 mg/dL — AB (ref 0.61–1.24)
Chloride: 111 mmol/L (ref 101–111)
GFR, EST AFRICAN AMERICAN: 23 mL/min — AB (ref >60)
GFR, EST NON AFRICAN AMERICAN: 20 mL/min — AB (ref >60)
Glucose, Bld: 93 mg/dL (ref 65–99)
Potassium: 3.7 mmol/L (ref 3.5–5.1)
SODIUM: 140 mmol/L (ref 135–145)
Total Protein: 5.9 g/dL — ABNORMAL LOW (ref 6.5–8.1)

## 2015-08-31 LAB — URINE MICROSCOPIC-ADD ON

## 2015-08-31 LAB — I-STAT CG4 LACTIC ACID, ED: Lactic Acid, Venous: 0.7 mmol/L (ref 0.5–2.0)

## 2015-08-31 LAB — CBG MONITORING, ED: Glucose-Capillary: 113 mg/dL — ABNORMAL HIGH (ref 65–99)

## 2015-08-31 LAB — TROPONIN I: Troponin I: 0.06 ng/mL — ABNORMAL HIGH (ref <0.031)

## 2015-08-31 LAB — AMMONIA: Ammonia: 23 umol/L (ref 9–35)

## 2015-08-31 MED ORDER — DEXTROSE 5 % IV SOLN
1.0000 g | INTRAVENOUS | Status: DC
  Filled 2015-08-31: qty 10

## 2015-08-31 MED ORDER — DEXTROSE 5 % IV SOLN
1.0000 g | Freq: Once | INTRAVENOUS | Status: AC
  Administered 2015-08-31: 1 g via INTRAVENOUS
  Filled 2015-08-31: qty 10

## 2015-08-31 NOTE — ED Provider Notes (Signed)
CSN: [id number]     Arrival date & time 08/31/15  1259 History   First MD Initiated Contact with Patient 08/31/15 1345     Chief Complaint  Patient presents with  . Altered Mental Status     (Consider location/radiation/quality/duration/timing/severity/associated sxs/prior Treatment) HPI Patient presents to the emergency department with altered mental status from a nursing facility.  The patient is not able to provide me any history.  The facility states that he seemed like he was altered and his blood sugar was 84.  I gave him glucagon at the time the facility states not had any fever, vomiting, diarrhea or syncope Past Medical History  Diagnosis Date  . Hypertension   . Hypercholesterolemia   . Glaucoma   . Cerebrovascular disease     a. 01/2007 MRI/A: L MCA A999333, R MCA A999333, LICA 123456.  . Chronic combined systolic and diastolic CHF, NYHA class 2 ([place])     a. 01/2007 Echo: EF 60-65%;  b. 03/2014 Echo: EF 45-50%, Gr 1 DD, severe LVH.  Marland Kitchen Headache   . Bedridden     transfers to chair  . Pneumonia 10/2006  . Hypothyroidism   . GERD (gastroesophageal reflux disease)   . Stroke ([place]) 01/2007    L corona radiata infarct; "left his right side weak" (09/16/2014)  . Stroke [facility]) 03/2014    "while in hospital; left side extremely weak since" (09/16/2014)  . Hepatitis dx'd 03/2014    C  . Type II diabetes mellitus (HCC)     Type II  . Depression   . CKD (chronic kidney disease), stage IV ([place])     [facility]  . History of cerebrovascular accident with residual effects     a. 01/2007 MRI/A: L MCA A999333, R MCA [id number], LICA 123456.   . Chronic kidney disease (CKD), stage IV (severe) ([place]) 05/27/2014  . Hepatitis C infection 01/04/2015    - Pt with HCV (+) antibody and HCV RNA Quant Log = 5.87 (H) (04/10/14)  . Hypoglycemia 02/06/2015  . AKI (acute kidney injury) [facility])    Past Surgical History  Procedure Laterality Date  . Colonoscopy w/ biopsies and polypectomy  2002  . Colon surgery     Polyps removed- surgically  . Trabeculectomy Left 08/18/2013    Procedure: TRABECULECTOMY WITH TUBE WITH [facility];  Surgeon: [name], MD;  Location: [facility];  Service: Ophthalmology;  Laterality: Left;  . Colonoscopy  2015  . Cataract extraction extracapsular Left 12/21/2014    Procedure: CATARACT EXTRACTION EXTRACAPSULAR WITH INTRAOCULAR LENS PLACEMENT LEFT EYE;  Surgeon: [name], MD;  Location: [facility];  Service: Ophthalmology;  Laterality: Left;   Family History  Problem Relation Age of Onset  . Family history unknown: Yes   Social History  Substance Use Topics  . Smoking status: Never Smoker   . Smokeless tobacco: Never Used  . Alcohol Use: No    Review of Systems Level V caveat applies due to altered mental status  Allergies  Review of patient's allergies indicates no known allergies.  Home Medications   Prior to Admission medications   Medication Sig Start Date End Date Taking? Authorizing Provider  gabapentin (NEURONTIN) 100 MG capsule Take 100 mg by mouth 3 (three) times daily.  11/20/14  Yes Historical Provider, MD  insulin lispro (HUMALOG) 100 UNIT/ML injection Inject 0-9 Units into the skin See admin instructions. Only sliding scale. 0-9 units   Yes Historical Provider, MD  PRESCRIPTION MEDICATION Take 1 each by mouth 2 (two)  times daily. Magic cup. Twice daily   Yes Historical Provider, MD  amLODipine (NORVASC) 10 MG tablet Take 1 tablet (10 mg total) by mouth daily. 04/18/14   Erline Hau, MD  atorvastatin (LIPITOR) 80 MG tablet Take 1 tablet (80 mg total) by mouth daily at 6 PM. 04/18/14   Erline Hau, MD  bimatoprost (LUMIGAN) 0.01 % SOLN Place 1 drop into the right eye at bedtime.    Historical Provider, MD  carvedilol (COREG) 12.5 MG tablet Take 1 tablet (12.5 mg total) by mouth 2 (two) times daily with a meal. 04/18/14   Erline Hau, MD  clopidogrel (PLAVIX) 75 MG tablet Take 1 tablet (75 mg total) by mouth daily. 04/18/14    Erline Hau, MD  Elbasvir-Grazoprevir (ZEPATIER) 50-100 MG TABS Take 1 tablet by mouth daily. 08/23/15   Thayer Headings, MD  furosemide (LASIX) 40 MG tablet Take 1.5 tablets (60 mg total) by mouth 2 (two) times daily. 03/13/15   Blane Ohara McDiarmid, MD  glucagon (GLUCAGON EMERGENCY) 1 MG injection Inject 1 mg into the vein once as needed (hypoglycemia).     Historical Provider, MD  insulin glargine (LANTUS) 100 UNIT/ML injection Inject 0.08 mLs (8 Units total) into the skin daily. Patient taking differently: Inject 6 Units into the skin daily.  05/17/15   Olam Idler, MD  insulin lispro (HUMALOG) 100 UNIT/ML injection Inject 4-6 Units into the skin 3 (three) times daily before meals. BKFST 6 units, lunch 6 units, dinner 4 units    Historical Provider, MD  ketorolac (ACULAR) 0.4 % SOLN Place 1 drop into the left eye 4 (four) times daily.    Historical Provider, MD  levothyroxine (SYNTHROID, LEVOTHROID) 100 MCG tablet Take 100 mcg by mouth daily before breakfast.    Historical Provider, MD  lisinopril (PRINIVIL,ZESTRIL) 10 MG tablet Take 1 tablet (10 mg total) by mouth daily. 03/10/15   Mariel Aloe, MD  NON FORMULARY Take 120 mLs by mouth 3 (three) times daily.     Historical Provider, MD  OVER THE COUNTER MEDICATION Take 1 scoop by mouth 2 (two) times daily. Protein powder mixed with food/drink (25 g of protein per serving) PURE PROTEIN    Historical Provider, MD  polyethylene glycol (MIRALAX / GLYCOLAX) packet Take 17 g by mouth 2 (two) times daily.    Historical Provider, MD  prednisoLONE acetate (PRED FORTE) 1 % ophthalmic suspension Place 1 drop into the left eye 4 (four) times daily.    Historical Provider, MD  sennosides-docusate sodium (SENOKOT-S) 8.6-50 MG tablet Take 2 tablets by mouth 2 (two) times daily.     Historical Provider, MD  tamsulosin (FLOMAX) 0.4 MG CAPS capsule Take 0.8 mg by mouth at bedtime.     Historical Provider, MD  Vortioxetine HBr (BRINTELLIX) 10 MG TABS  Take 10 mg by mouth daily.     Historical Provider, MD   BP 171/88 mmHg  Pulse 63  Temp(Src) 96 F (35.6 C) (Rectal)  Resp 18  Ht 5\' 6"  (1.676 m)  Wt 63.957 kg  BMI 22.77 kg/m2  SpO2 100% Physical Exam  Constitutional: He appears well-developed and well-nourished. No distress.  HENT:  Head: Normocephalic and atraumatic.  Mouth/Throat: Oropharynx is clear and moist.  Eyes: Pupils are equal, round, and reactive to light.  Neck: Normal range of motion. Neck supple.  Cardiovascular: Normal rate, regular rhythm and normal heart sounds.  Exam reveals no gallop and no friction rub.  No murmur heard. Pulmonary/Chest: Effort normal and breath sounds normal. No respiratory distress. He has no wheezes.  Abdominal: Soft. Bowel sounds are normal. He exhibits no distension. There is no tenderness.  Neurological: He is alert. He exhibits normal muscle tone. Coordination normal.  Skin: Skin is warm and dry. No rash noted. No erythema.  Psychiatric: He has a normal mood and affect. His behavior is normal.  Nursing note and vitals reviewed.   ED Course  Procedures (including critical care time) Labs Review Labs Reviewed  COMPREHENSIVE METABOLIC PANEL - Abnormal; Notable for the following:    BUN 44 (*)    Creatinine, Ser 2.99 (*)    Calcium 8.4 (*)    Total Protein 5.9 (*)    Albumin 2.4 (*)    GFR calc non Af Amer 20 (*)    GFR calc Af Amer 23 (*)    All other components within normal limits  CBC - Abnormal; Notable for the following:    RBC 3.08 (*)    Hemoglobin 8.9 (*)    HCT 27.8 (*)    Platelets 133 (*)    All other components within normal limits  CBG MONITORING, ED - Abnormal; Notable for the following:    Glucose-Capillary 113 (*)    All other components within normal limits  CBG MONITORING, ED  I-STAT CG4 LACTIC ACID, ED    Imaging Review Dg Chest 2 View  08/31/2015  CLINICAL DATA:  Altered mental status EXAM: CHEST  2 VIEW COMPARISON:  09/16/2014 FINDINGS: Borderline  cardiomegaly. Central mild vascular congestion without convincing pulmonary edema. No segmental infiltrate. Bony thorax is unremarkable. IMPRESSION: No active cardiopulmonary disease.  Borderline cardiomegaly. Electronically Signed   By: Lahoma Crocker M.D.   On: 08/31/2015 15:11   I have personally reviewed and evaluated these images and lab results as part of my medical decision-making.   EKG Interpretation None      MDM   Final diagnoses:  None  On reassessment, the patient is answering my questions.  He states he does not have any pain and no other issues.  He states that the year is 2016 but states that  it is 2017 and then states that it is May instead of June.  Otherwise, the patient's been stable.  Initially, he was not answering questions and will shake his head yes or no.Patient is awaiting CT scan of his head and will need to be reassessed for possible admission versus discharge  Dalia Heading, PA-C 09/01/15 [place], MD 09/02/15 [phone]

## 2015-08-31 NOTE — Discharge Instructions (Signed)
Return here as needed. Follow up with your PCP.  °

## 2015-08-31 NOTE — ED Notes (Signed)
Admitting MD at bedside.

## 2015-08-31 NOTE — ED Notes (Signed)
Spoke with [name] from [facility]. Pt is normally alert and oriented to self, place and situation.

## 2015-08-31 NOTE — ED Notes (Signed)
Attempted report 

## 2015-08-31 NOTE — H&P (Signed)
[name] Service Pager: [phone]  Patient name: [name] Medical record number: [id number] Date of birth: 1946/03/09 Age: 70 y.o. Gender: male  Primary Care Provider: Cordelia Poche, MD Consultants: None  Code Status: Full   Chief Complaint: Decreased Mental Status   Assessment and Plan: [name] is a 70 y.o. male presenting with altered mental status . 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 Patient is legally blind which adds an increased layer of difficulty to exam. However, per discussion with nursing home, patient's neurologic findings seem chronic in nature. CT head negative for any acute changes, old right thalamic lacunar infarct noted. Cardiac source should be consider, including possibly arrhthymias, EKG non-specific repolarization in V1, LVH, no ST elevation/depression, Troponin 0.06, therefore cardiac cause unlikely. No electrolyte abnormalities. Noted to be hypothermic on admission with temp of 96. However not meeting SIRS was WBC 7.1, RR 10, and hypertension. qSOFA of 1. CXR negative for any acute process. UA was found to be positive for LE, Nitrites, and many bacteria, provided a likely source of altered mental status. LA 0.70. No new medications, no significant medication interactions.  - Attending Dr. Andria Frames admit to inpatient telemetry  - Vitals per floor  - Neuro checks q6hrs  - Pulse oximetry, sats above 92%  - Blood cultures x 2, urine culture pending  - Ammonia level as patient with hx of hepatitis  - TSH considering hx of hypothyroidism  - Trend troponin x 3  - AM EKG   - Ceftriaxone 1gram in the ED, continue q24 hrs   UTI: UA  positive for LE, nitrites, moderate hgb and >300 protein, indicative of possibly nephritic/nephrotic syndrome vs due to infection  - Urine culture pending  - Continue 1 gram Ceftriaxone daily  - Continue Flomax for urinary retention - Bladder scan, post-void residual   AoCKD (stage IV) Admission Scr 2.999. Baseline Cr 2-2.5 . Possibly due UTI  - Continue to follow BMET daily  - Hold lisinopril and lasix for now   T2DM - Hold home insulin regiment Lantus 6 and Humalog 4-6 units TID. A1C 7.4 - ACQHS  - Sensitive sliding scale   Hypertensive urgency: BP 220/96  - Norvasc 10 mg  Coreg 12.5 mg daily  - Hydralazine 5 mg  - Hydralazine PRN    Hypothyroidism  - Synthroid 100 mcg daily  - TSH pending   Hx of multiple CVAs  - Continue Plavix 75 mg   Borderline HFrEF - ECHO EF of 45-50%, G1DD, CXR clear, no crackles on exam, however 2+ pitting edema to knees - Continue Coreg 12.5 mg  - Holding lasix for now due kidney function  - Consider restarting tomorrow if BMET improved - Daily weights, strict I/O - BNP  Depression  - Continue Brintellix10 mg daily   Glaucoma: per nursing home legally blind. Per Dr. Agustin Cree 2016 note - perceive light in left eye, unable to to perceive light in his right eye. 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  - kertolac and prednislone drops in left eye - Brimapost in right eye   FEN/GI: Renal/Carb Mod diet, bedside swallow  Prophylaxis: Lovenox   Disposition: Telemetry   History of Present Illness:  [name] is a 70 y.o. male presenting with altered  mental status from [place]. Per ED physician patient was unresponsive at [facility]. They gave him glucagon and checked his CBG which was 84.   Patient unable to provide any further information. Denied any pain, and no complaints on a review of systems   [facility] for further information. Per Nurse, patient was found slumped over in chair and was not responsive over the course of the 15  minutes he remained with them until EMS came. She was not able to give any further information about the event. She states his vitals during this event were normal, BP143/83, HR 54, Sats 96%. Denies patient having any prodrome, including decrease appetite prior to acute event, nausea, vomiting.   Neurologically, the nurse states patient is legally blind, has sluggish pupillary reflexes of exam, hemiplegic on left sided, with left leg contracture, as well as right sided weakness. Normally alert and orientated to place and self.    Review Of Systems: Per HPI with the following additions: Review of Systems  Constitutional: Negative for fever and chills.  Respiratory: Negative for cough and shortness of breath.   Cardiovascular: Negative for chest pain.  Gastrointestinal: Negative for nausea and vomiting.  Genitourinary: Negative for flank pain.  Neurological: Negative for sensory change, focal weakness, weakness and headaches.    Otherwise the remainder of the systems were negative.  Patient Active Problem List   Diagnosis Date Noted  . Altered mental status 08/31/2015  . Not immune to hepatitis B virus 06/27/2015  . Hypothyroidism 06/27/2015  . Glaucoma 02/01/2015  . Chronic hepatitis C without hepatic coma ([place]) 01/04/2015  . Person living in residential institution 12/28/2014  . Depression 12/28/2014  . HLD (hyperlipidemia) 06/16/2014  . Peripheral vision loss 06/16/2014  . 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 Chronic kidney disease (CKD), stage IV (severe) ([place]) 05/27/2014  . Essential hypertension   . Type 2 diabetes mellitus with other circulatory complications ([place])   . Congestive dilated cardiomyopathy ([place]) 04/11/2014  . History of cerebrovascular accident with residual effects     Past Medical History: Past Medical History  Diagnosis Date  . Hypertension   . Hypercholesterolemia   . Glaucoma   . Cerebrovascular disease      a. 01/2007 MRI/A: L MCA A999333, R MCA A999333, LICA [id number].  . Chronic combined systolic and diastolic CHF, NYHA class 2 ([place])     a. 01/2007 Echo: EF 60-65%;  b. 03/2014 Echo: EF 45-50%, Gr 1 DD, severe LVH.  Marland Kitchen Headache   . Bedridden     transfers to chair  . Pneumonia 10/2006  . Hypothyroidism   . GERD (gastroesophageal reflux disease)   . Stroke ([place]) 01/2007    L corona radiata infarct; "left his right side weak" (09/16/2014)  . Stroke [facility]) 03/2014    "while in hospital; left side extremely weak since" (09/16/2014)  . Hepatitis dx'd 03/2014    C  . Type II diabetes mellitus (HCC)     Type II  . Depression   . CKD (chronic kidney disease), stage IV ([place])     [facility]  . History of cerebrovascular accident with residual effects     a. 01/2007 MRI/A: L MCA A999333, R MCA [id number], LICA [id number].   . Chronic kidney disease (CKD), stage IV (severe) ([place]) 05/27/2014  . Hepatitis C infection 01/04/2015    - Pt with HCV (+) antibody and HCV RNA Quant Log = 5.87 (H) (04/10/14)  . Hypoglycemia 02/06/2015  .  AKI (acute kidney injury) [facility])     Past Surgical History: Past Surgical History  Procedure Laterality Date  . Colonoscopy w/ biopsies and polypectomy  2002  . Colon surgery      Polyps removed- surgically  . Trabeculectomy Left 08/18/2013    Procedure: TRABECULECTOMY WITH TUBE WITH [facility];  Surgeon: [name], MD;  Location: [place];  Service: Ophthalmology;  Laterality: Left;  . Colonoscopy  2015  . Cataract extraction extracapsular Left 12/21/2014    Procedure: CATARACT EXTRACTION EXTRACAPSULAR WITH INTRAOCULAR LENS PLACEMENT LEFT EYE;  Surgeon: [name], MD;  Location: [place];  Service: Ophthalmology;  Laterality: Left;    Social History: Social History  Substance Use Topics  . Smoking status: Never Smoker   . Smokeless tobacco: Never Used  . Alcohol Use: No   Additional social history:  Please also refer to relevant sections of EMR.  Family History: Family History  Problem  Relation Age of Onset  . Family history unknown: Yes     Allergies and Medications: No Known Allergies Current Facility-Administered Medications on File Prior to Encounter  Medication Dose Route Frequency Provider Last Rate Last Dose  . gatifloxacin (ZYMAXID) 0.5 % ophthalmic drops 1 drop  1 drop Left Eye PRN [name], MD      . phenylephrine (MYDFRIN) 2.5 % ophthalmic solution 1 drop  1 drop Left Eye Once [name], MD       Current Outpatient Prescriptions on File Prior to Encounter  Medication Sig Dispense Refill  . gabapentin (NEURONTIN) 100 MG capsule Take 100 mg by mouth 3 (three) times daily.     Marland Kitchen amLODipine (NORVASC) 10 MG tablet Take 1 tablet (10 mg total) by mouth daily.    Marland Kitchen atorvastatin (LIPITOR) 80 MG tablet Take 1 tablet (80 mg total) by mouth daily at 6 PM.    . bimatoprost (LUMIGAN) 0.01 % SOLN Place 1 drop into the right eye at bedtime.    . carvedilol (COREG) 12.5 MG tablet Take 1 tablet (12.5 mg total) by mouth 2 (two) times daily with a meal.    . clopidogrel (PLAVIX) 75 MG tablet Take 1 tablet (75 mg total) by mouth daily.    . Elbasvir-Grazoprevir (ZEPATIER) 50-100 MG TABS Take 1 tablet by mouth daily. 28 tablet 2  . furosemide (LASIX) 40 MG tablet Take 1.5 tablets (60 mg total) by mouth 2 (two) times daily. 30 tablet   . glucagon (GLUCAGON EMERGENCY) 1 MG injection Inject 1 mg into the vein once as needed (hypoglycemia).     . insulin glargine (LANTUS) 100 UNIT/ML injection Inject 0.08 mLs (8 Units total) into the skin daily. (Patient taking differently: Inject 6 Units into the skin daily. ) 10 mL 0  . insulin lispro (HUMALOG) 100 UNIT/ML injection Inject 4-6 Units into the skin 3 (three) times daily before meals. BKFST 6 units, lunch 6 units, dinner 4 units    . ketorolac (ACULAR) 0.4 % SOLN Place 1 drop into the left eye 4 (four) times daily.    Marland Kitchen levothyroxine (SYNTHROID, LEVOTHROID) 100 MCG tablet Take 100 mcg by mouth daily before breakfast.    .  lisinopril (PRINIVIL,ZESTRIL) 10 MG tablet Take 1 tablet (10 mg total) by mouth daily.    . NON FORMULARY Take 120 mLs by mouth 3 (three) times daily.     Marland Kitchen OVER THE COUNTER MEDICATION Take 1 scoop by mouth 2 (two) times daily. Protein powder mixed with food/drink (25 g of protein per serving) PURE PROTEIN    .  polyethylene glycol (MIRALAX / GLYCOLAX) packet Take 17 g by mouth 2 (two) times daily.    . prednisoLONE acetate (PRED FORTE) 1 % ophthalmic suspension Place 1 drop into the left eye 4 (four) times daily.    . sennosides-docusate sodium (SENOKOT-S) 8.6-50 MG tablet Take 2 tablets by mouth 2 (two) times daily.     . tamsulosin (FLOMAX) 0.4 MG CAPS capsule Take 0.8 mg by mouth at bedtime.     . Vortioxetine HBr (BRINTELLIX) 10 MG TABS Take 10 mg by mouth daily.       Objective: BP 225/95 mmHg  Pulse 70  Temp(Src) 97.7 F (36.5 C) (Rectal)  Resp 20  Ht 5\' 6"  (1.676 m)  Wt 141 lb (63.957 kg)  BMI 22.77 kg/m2  SpO2 100% Exam: General: Patient lying in bed, NAD, very pleasant  Eyes: very sluggishly reactive to light, right less reactive > left. Gross vision, none in right eye, left eyes still with gross vision, ENTM: Moist mucosa membranes  Neck: No lymphadenopathy, no thyromegaly  Cardiovascular: RRR, no murmurs  Respiratory: CTAB, no wheezes on exam  Abdomen: BS+, no ttp, no distention  MSK: 2+ pitting edema in both legs, left leg contracted, patient unable to stretch it out  Skin: No ulcers or lesions noted  Neuro: 5/5 strength in right UE, 2/5 strength in left UE, 2/5 strength in RLE, 0/5 strength in LLE  Psych: alert and orientated to place and self   Labs and Imaging: CBC BMET   Recent Labs Lab 08/31/15 1332  WBC 7.1  HGB 8.9*  HCT 27.8*  PLT 133*    Recent Labs Lab 08/31/15 1332  NA 140  K 3.7  CL 111  CO2 24  BUN 44*  CREATININE 2.99*  GLUCOSE 93  CALCIUM 8.4*     EKG - non-specific repolarization changes, no ST elevation/depression, LVH   Troponin  0.06    Dg Chest 2 View  08/31/2015  CLINICAL DATA:  Altered mental status EXAM: CHEST  2 VIEW COMPARISON:  09/16/2014 FINDINGS: Borderline cardiomegaly. Central mild vascular congestion without convincing pulmonary edema. No segmental infiltrate. Bony thorax is unremarkable. IMPRESSION: No active cardiopulmonary disease.  Borderline cardiomegaly. Electronically Signed   By: Lahoma Crocker M.D.   On: 08/31/2015 15:11   Ct Head Wo Contrast  08/31/2015  CLINICAL DATA:  Altered mental status, history of stroke EXAM: CT HEAD WITHOUT CONTRAST TECHNIQUE: Contiguous axial images were obtained from the base of the skull through the vertex without intravenous contrast. COMPARISON:  MRI brain dated 04/12/2014 FINDINGS: No evidence of parenchymal hemorrhage or extra-axial fluid collection. No mass lesion, mass effect, or midline shift. No CT evidence of acute infarction. Old right thalamic lacunar infarct. Extensive small vessel ischemic changes. Mild cortical atrophy.  No ventriculomegaly. Chronic opacification of the bilateral sphenoid sinuses with partial opacification of the bilateral ethmoid sinuses. Mastoid air cells are clear. No evidence of calvarial fracture. IMPRESSION: No evidence of acute intracranial abnormality. Old right thalamic lacunar infarct. Atrophy with small vessel ischemic changes. Electronically Signed   By: Julian Hy M.D.   On: 08/31/2015 18:59    Brenten Janney Cletis Media, MD 09/01/2015, 12:41 AM PGY-1, [place] Intern pager: [phone], text pages welcome

## 2015-08-31 NOTE — ED Provider Notes (Signed)
Patient care assumed from [place] PA-C. Please see his note for further detail. Plan is for follow-up on CT head. If negative, patient may be discharged home to his nursing facility. CT head shows no acute intracranial abnormalities. Upon reevaluation, patient is resting comfortably in emergency department and in no apparent distress. He is alert and oriented to person and place, unsure of the year. Screening labs are unremarkable, CT head shows no acute intracranial abnormalities. Chest x-ray also unremarkable. Discussed with Dr. Winfred Leeds Will order urinalysis to further evaluate possible source of patient's altered mental status, contact patient living facility to further understand patient's baseline. Per nursing staff at [facility], patient is typically oriented to self, place and situation, but not generally to time. Patient is generally able to carry on conversation. He also reports that she was not working during patient's event, the only notes at the facility report that he was momentarily unresponsive? Patient is oriented to person and place now, but does not carry on conversation.  Evidence of UTI on urinalysis, culture obtained. Likely source of patient's altered mental status. Discussed with family medicine, Dr. Emmaline Life, patient to be admitted to medical service for antibiotics and further evaluation. Blood cultures obtained. Patient given 1 g Rocephin.  Comer Locket, PA-C 08/31/15 [place], MD 09/01/15 [phone]

## 2015-08-31 NOTE — ED Notes (Signed)
Pt brought in EMS from [facility].  Per staff pt was unconscious and was given 1mg  Glucagon IM.  Per EMS upon their arrival he was responsive to pain and became more alert throughout transport.  Pt would only shake head in response.  Staff reports pt is normally verbal.

## 2015-09-01 DIAGNOSIS — R404 Transient alteration of awareness: Secondary | ICD-10-CM

## 2015-09-01 DIAGNOSIS — R4182 Altered mental status, unspecified: Secondary | ICD-10-CM | POA: Diagnosis not present

## 2015-09-01 DIAGNOSIS — R531 Weakness: Secondary | ICD-10-CM | POA: Insufficient documentation

## 2015-09-01 DIAGNOSIS — N390 Urinary tract infection, site not specified: Secondary | ICD-10-CM | POA: Insufficient documentation

## 2015-09-01 DIAGNOSIS — I6990 Unspecified sequelae of unspecified cerebrovascular disease: Secondary | ICD-10-CM | POA: Diagnosis not present

## 2015-09-01 DIAGNOSIS — R55 Syncope and collapse: Secondary | ICD-10-CM | POA: Diagnosis not present

## 2015-09-01 DIAGNOSIS — E039 Hypothyroidism, unspecified: Secondary | ICD-10-CM | POA: Diagnosis not present

## 2015-09-01 DIAGNOSIS — N184 Chronic kidney disease, stage 4 (severe): Secondary | ICD-10-CM | POA: Diagnosis not present

## 2015-09-01 DIAGNOSIS — N179 Acute kidney failure, unspecified: Secondary | ICD-10-CM | POA: Diagnosis not present

## 2015-09-01 LAB — COMPREHENSIVE METABOLIC PANEL
ALBUMIN: 2.2 g/dL — AB (ref 3.5–5.0)
ALK PHOS: 90 U/L (ref 38–126)
ALT: 23 U/L (ref 17–63)
ANION GAP: 8 (ref 5–15)
AST: 24 U/L (ref 15–41)
BILIRUBIN TOTAL: 0.4 mg/dL (ref 0.3–1.2)
BUN: 43 mg/dL — ABNORMAL HIGH (ref 6–20)
CALCIUM: 8.6 mg/dL — AB (ref 8.9–10.3)
CO2: 24 mmol/L (ref 22–32)
Chloride: 110 mmol/L (ref 101–111)
Creatinine, Ser: 2.73 mg/dL — ABNORMAL HIGH (ref 0.61–1.24)
GFR, EST AFRICAN AMERICAN: 26 mL/min — AB (ref >60)
GFR, EST NON AFRICAN AMERICAN: 22 mL/min — AB (ref >60)
GLUCOSE: 83 mg/dL (ref 65–99)
POTASSIUM: 3.9 mmol/L (ref 3.5–5.1)
Sodium: 142 mmol/L (ref 135–145)
TOTAL PROTEIN: 5.4 g/dL — AB (ref 6.5–8.1)

## 2015-09-01 LAB — CBC
HEMATOCRIT: 27.6 % — AB (ref 39.0–52.0)
HEMOGLOBIN: 8.9 g/dL — AB (ref 13.0–17.0)
MCH: 29.1 pg (ref 26.0–34.0)
MCHC: 32.2 g/dL (ref 30.0–36.0)
MCV: 90.2 fL (ref 78.0–100.0)
Platelets: 141 10*3/uL — ABNORMAL LOW (ref 150–400)
RBC: 3.06 MIL/uL — AB (ref 4.22–5.81)
RDW: 14.7 % (ref 11.5–15.5)
WBC: 5.2 10*3/uL (ref 4.0–10.5)

## 2015-09-01 LAB — GLUCOSE, CAPILLARY
GLUCOSE-CAPILLARY: 95 mg/dL (ref 65–99)
GLUCOSE-CAPILLARY: 304 mg/dL — AB (ref 65–99)
Glucose-Capillary: 200 mg/dL — ABNORMAL HIGH (ref 65–99)

## 2015-09-01 LAB — MRSA PCR SCREENING: MRSA by PCR: POSITIVE — AB

## 2015-09-01 LAB — TSH: TSH: 1.445 u[IU]/mL (ref 0.350–4.500)

## 2015-09-01 LAB — TROPONIN I
TROPONIN I: 0.06 ng/mL — AB (ref <0.031)
Troponin I: 0.06 ng/mL — ABNORMAL HIGH (ref <0.031)
Troponin I: 0.06 ng/mL — ABNORMAL HIGH (ref <0.031)

## 2015-09-01 LAB — BRAIN NATRIURETIC PEPTIDE: B NATRIURETIC PEPTIDE 5: 258 pg/mL — AB (ref 0.0–100.0)

## 2015-09-01 MED ORDER — CHLORHEXIDINE GLUCONATE CLOTH 2 % EX PADS: 6.0000 | MEDICATED_PAD | Freq: Every day | CUTANEOUS | Status: DC

## 2015-09-01 MED ORDER — HYDRALAZINE HCL 20 MG/ML IJ SOLN
10.0000 mg | Freq: Four times a day (QID) | INTRAMUSCULAR | Status: DC | PRN
  Administered 2015-09-01: 10 mg via INTRAVENOUS

## 2015-09-01 MED ORDER — CETYLPYRIDINIUM CHLORIDE 0.05 % MT LIQD
7.0000 mL | Freq: Two times a day (BID) | OROMUCOSAL | Status: DC
  Administered 2015-09-01: 7 mL via OROMUCOSAL

## 2015-09-01 MED ORDER — LISINOPRIL 10 MG PO TABS: 10.0000 mg | ORAL_TABLET | Freq: Every day | ORAL | Status: DC

## 2015-09-01 MED ORDER — VORTIOXETINE HBR 10 MG PO TABS
10.0000 mg | ORAL_TABLET | Freq: Every day | ORAL | Status: DC
  Administered 2015-09-01: 10 mg via ORAL
  Filled 2015-09-01: qty 1

## 2015-09-01 MED ORDER — FUROSEMIDE 40 MG PO TABS: 60.0000 mg | ORAL_TABLET | Freq: Two times a day (BID) | ORAL | Status: DC

## 2015-09-01 MED ORDER — LEVOTHYROXINE SODIUM 100 MCG PO TABS
100.0000 ug | ORAL_TABLET | Freq: Every day | ORAL | Status: DC
  Administered 2015-09-01: 100 ug via ORAL
  Filled 2015-09-01: qty 1

## 2015-09-01 MED ORDER — CLOPIDOGREL BISULFATE 75 MG PO TABS
75.0000 mg | ORAL_TABLET | Freq: Every day | ORAL | Status: DC
  Administered 2015-09-01: 75 mg via ORAL

## 2015-09-01 MED ORDER — SODIUM CHLORIDE 0.9% FLUSH
3.0000 mL | Freq: Two times a day (BID) | INTRAVENOUS | Status: DC
  Administered 2015-09-01 (×2): 3 mL via INTRAVENOUS

## 2015-09-01 MED ORDER — AMLODIPINE BESYLATE 10 MG PO TABS
10.0000 mg | ORAL_TABLET | Freq: Every day | ORAL | Status: DC
  Administered 2015-09-01: 10 mg via ORAL
  Filled 2015-09-01: qty 1

## 2015-09-01 MED ORDER — ATORVASTATIN CALCIUM 80 MG PO TABS
80.0000 mg | ORAL_TABLET | Freq: Every day | ORAL | Status: DC
  Administered 2015-09-01: 80 mg via ORAL
  Filled 2015-09-01: qty 1

## 2015-09-01 MED ORDER — HYDRALAZINE HCL 20 MG/ML IJ SOLN
5.0000 mg | Freq: Once | INTRAMUSCULAR | Status: AC
  Administered 2015-09-01: 5 mg via INTRAVENOUS
  Filled 2015-09-01: qty 1

## 2015-09-01 MED ORDER — TAMSULOSIN HCL 0.4 MG PO CAPS
0.8000 mg | ORAL_CAPSULE | Freq: Every day | ORAL | Status: DC
  Administered 2015-09-01: 0.8 mg via ORAL
  Filled 2015-09-01: qty 2

## 2015-09-01 MED ORDER — KETOROLAC TROMETHAMINE 0.5 % OP SOLN
1.0000 [drp] | Freq: Four times a day (QID) | OPHTHALMIC | Status: DC
  Administered 2015-09-01 (×3): 1 [drp] via OPHTHALMIC
  Filled 2015-09-01: qty 3

## 2015-09-01 MED ORDER — INSULIN LISPRO 100 UNIT/ML ~~LOC~~ SOLN: 4.0000 [IU] | Freq: Three times a day (TID) | SUBCUTANEOUS | Status: DC

## 2015-09-01 MED ORDER — MUPIROCIN 2 % EX OINT
1.0000 "application " | TOPICAL_OINTMENT | Freq: Two times a day (BID) | CUTANEOUS | Status: DC
  Administered 2015-09-01: 1 via NASAL
  Filled 2015-09-01: qty 22

## 2015-09-01 MED ORDER — LATANOPROST 0.005 % OP SOLN
1.0000 [drp] | Freq: Every day | OPHTHALMIC | Status: DC
  Administered 2015-09-01: 1 [drp] via OPHTHALMIC
  Filled 2015-09-01: qty 2.5

## 2015-09-01 MED ORDER — GABAPENTIN 100 MG PO CAPS
100.0000 mg | ORAL_CAPSULE | Freq: Three times a day (TID) | ORAL | Status: DC
  Administered 2015-09-01 (×2): 100 mg via ORAL
  Filled 2015-09-01 (×2): qty 1

## 2015-09-01 MED ORDER — CARVEDILOL 12.5 MG PO TABS
12.5000 mg | ORAL_TABLET | Freq: Two times a day (BID) | ORAL | Status: DC
  Administered 2015-09-01 (×2): 12.5 mg via ORAL
  Filled 2015-09-01 (×2): qty 1

## 2015-09-01 MED ORDER — HYDRALAZINE HCL 20 MG/ML IJ SOLN: 5.0000 mg | Freq: Four times a day (QID) | INTRAMUSCULAR | Status: DC | PRN

## 2015-09-01 MED ORDER — INSULIN ASPART 100 UNIT/ML ~~LOC~~ SOLN
0.0000 [IU] | Freq: Three times a day (TID) | SUBCUTANEOUS | Status: DC
  Administered 2015-09-01: 2 [IU] via SUBCUTANEOUS
  Administered 2015-09-01: 7 [IU] via SUBCUTANEOUS

## 2015-09-01 MED ORDER — HYDRALAZINE HCL 20 MG/ML IJ SOLN
10.0000 mg | INTRAMUSCULAR | Status: DC | PRN
  Administered 2015-09-01: 10 mg via INTRAVENOUS
  Filled 2015-09-01: qty 1

## 2015-09-01 MED ORDER — CIPROFLOXACIN HCL 500 MG PO TABS: 500.0000 mg | ORAL_TABLET | Freq: Two times a day (BID) | ORAL | Status: DC

## 2015-09-01 MED ORDER — INSULIN GLARGINE 100 UNIT/ML ~~LOC~~ SOLN: 4.0000 [IU] | Freq: Every day | SUBCUTANEOUS | Status: DC

## 2015-09-01 MED ORDER — PREDNISOLONE ACETATE 1 % OP SUSP
1.0000 [drp] | Freq: Four times a day (QID) | OPHTHALMIC | Status: DC
  Administered 2015-09-01 (×3): 1 [drp] via OPHTHALMIC
  Filled 2015-09-01: qty 1

## 2015-09-01 MED ORDER — ENOXAPARIN SODIUM 30 MG/0.3ML ~~LOC~~ SOLN
30.0000 mg | SUBCUTANEOUS | Status: DC
  Administered 2015-09-01: 30 mg via SUBCUTANEOUS
  Filled 2015-09-01: qty 0.3

## 2015-09-01 NOTE — Evaluation (Signed)
Clinical/Bedside Swallow Evaluation Patient Details  Name: [name] MRN: [id number] Date of Birth: 21-Nov-1945  Today's Date: 09/01/2015 Time: SLP Start Time (ACUTE ONLY): 44 SLP Stop Time (ACUTE ONLY): 1035 SLP Time Calculation (min) (ACUTE ONLY): 15 min  Past Medical History:  Past Medical History  Diagnosis Date  . Hypertension   . Hypercholesterolemia   . Glaucoma   . Cerebrovascular disease     a. 01/2007 MRI/A: L MCA A999333, R MCA A999333, LICA 123456.  . Chronic combined systolic and diastolic CHF, NYHA class 2 ([place])     a. 01/2007 Echo: EF 60-65%;  b. 03/2014 Echo: EF 45-50%, Gr 1 DD, severe LVH.  Marland Kitchen Headache   . Bedridden     transfers to chair  . Pneumonia 10/2006  . Hypothyroidism   . GERD (gastroesophageal reflux disease)   . Stroke ([place]) 01/2007    L corona radiata infarct; "left his right side weak" (09/16/2014)  . Stroke [facility]) 03/2014    "while in hospital; left side extremely weak since" (09/16/2014)  . Hepatitis dx'd 03/2014    C  . Type II diabetes mellitus (HCC)     Type II  . Depression   . CKD (chronic kidney disease), stage IV ([place])     [facility]  . History of cerebrovascular accident with residual effects     a. 01/2007 MRI/A: L MCA A999333, R MCA [id number], LICA 123456.   . Chronic kidney disease (CKD), stage IV (severe) ([place]) 05/27/2014  . Hepatitis C infection 01/04/2015    - Pt with HCV (+) antibody and HCV RNA Quant Log = 5.87 (H) (04/10/14)  . Hypoglycemia 02/06/2015  . AKI (acute kidney injury) [facility])    Past Surgical History:  Past Surgical History  Procedure Laterality Date  . Colonoscopy w/ biopsies and polypectomy  2002  . Colon surgery      Polyps removed- surgically  . Trabeculectomy Left 08/18/2013    Procedure: TRABECULECTOMY WITH TUBE WITH [facility];  Surgeon: [name], MD;  Location: [place];  Service: Ophthalmology;  Laterality: Left;  . Colonoscopy  2015  . Cataract extraction extracapsular Left 12/21/2014    Procedure: CATARACT EXTRACTION  EXTRACAPSULAR WITH INTRAOCULAR LENS PLACEMENT LEFT EYE;  Surgeon: [name], MD;  Location: [place];  Service: Ophthalmology;  Laterality: Left;   HPI:  70 year old male admitted 08/31/15 due to unresponsiveness. PMH significant for CVAx2,    Assessment / Plan / Recommendation Clinical Impression  Pt presents with adequate oral motor strength and function despite history of CVAx2. Pt self-feeding breakfast. No overt s/s aspiration observed or reported. Pt underwent MBS July 2016, which indicated no penetration or aspiration, and recommended regular diet and thin liquids. Will continue regular diet at this time. No further ST intervention recommended. Please reconsult if needs arise.     Aspiration Risk  Mild aspiration risk    Diet Recommendation Thin liquid;Regular   Liquid Administration via: Cup;Straw Medication Administration: Whole meds with liquid Supervision: Patient able to self feed Compensations: Minimize environmental distractions;Slow rate;Small sips/bites Postural Changes: Seated upright at 90 degrees;Remain upright for at least 30 minutes after po intake    Other  Recommendations Oral Care Recommendations: Oral care BID    Prognosis Prognosis for Safe Diet Advancement: Good      Swallow Study   General Date of Onset: 08/31/15 HPI: 70 year old male admitted 08/31/15 due to unresponsiveness. PMH significant for CVAx2,  Type of Study: Bedside Swallow Evaluation Previous Swallow Assessment: MBS 09/2014 -  no penetration or aspiration. Regular diet recommended. Diet Prior to this Study: Regular;Thin liquids Temperature Spikes Noted: No Respiratory Status: Room air History of Recent Intubation: No Behavior/Cognition: Alert;Cooperative;Pleasant mood Oral Cavity Assessment: Within Functional Limits Oral Care Completed by SLP: No (pt eating breakfast) Oral Cavity - Dentition: Adequate natural dentition Vision: Functional for self-feeding Self-Feeding Abilities: Able to feed  self Patient Positioning: Upright in bed Baseline Vocal Quality: Normal Volitional Cough: Strong Volitional Swallow: Able to elicit    Oral/Motor/Sensory Function Overall Oral Motor/Sensory Function: Within functional limits (Pt reports weakness, however, ROM WFL with symnmettrical movement noted)   Ice Chips Ice chips: Not tested   Thin Liquid Thin Liquid: Within functional limits Presentation: Straw;Cup;Self Fed    Nectar Thick Nectar Thick Liquid: Not tested   Honey Thick Honey Thick Liquid: Not tested   Puree Puree: Within functional limits Presentation: Self Fed;Spoon   Solid    Solid: Within functional limits Presentation: [employer], [place] 09/01/2015,10:46 AM  Enriqueta Shutter. Quentin Ore [facility], [place] [phone]

## 2015-09-01 NOTE — Progress Notes (Signed)
Called for report.  Nurse was with another patient.  She stated she would call me back.

## 2015-09-01 NOTE — Discharge Summary (Signed)
[name] Discharge Summary  Patient name: [name] Medical record number: [id number] Date of birth: Feb 27, 1946 Age: 70 y.o. Gender: male Date of Admission: 08/31/2015  Date of Discharge: 09/01/15 Admitting Physician: Zenia Resides, MD  Primary Care Provider: Cordelia Poche, MD Consultants: None  Indication for Hospitalization: AMS  Discharge Diagnoses/Problem List:  Urinary tract infection vs asymptomatic bacturia Urinary retention AKI in CKD IV Hypertension T2DM Hypothyroidism Hx multiple CVAs Borderline HFrEF Depression Glaucoma  Disposition: Back to [facility]  Discharge Condition: Stable, improved  Discharge Exam:  General: Patient lying in bed, sleepy but easily arousable, in NAD HEENT: /AT, EOMI, MMM Neck: No lymphadenopathy, no thyromegaly  Cardiovascular: RRR, no murmurs  Respiratory: CTAB, no wheezes on exam  Abdomen: BS+, no ttp, no distention  MSK: 1+ pitting edema in both legs, left leg contracted, patient unable to stretch it out  Skin: No ulcers or lesions noted  Neuro: 5/5 strength in RUE, 2/5 strength in LUE, tremor noted in LUE; Pt does not answer orientation questions.  Brief [name] Course:  [name] is a 70 year old male with a PMH of urinary retention, hypertension, CKD IV, T2DM, hypothyroidism, hx multiple CVAs, borderline HFrEF, depression, and glaucoma who was sent to the ED from [facility] with altered mental status/unresponsiveness and a concern for a hypoglycemic event. In the ED, CT head was negative for acute changes, troponin was 0.06, CXR was negative, lactic acid was 0.70, UA was positive for LE, nitrites, and many bacteria. He was not meeting any SIRS criteria and qSOFA was 1. [name] course is described by problem list below.  Altered mental status / unresponsiveness / concern for hypoglycemia: In the ED, Pt was at his mental baseline. Cardiac work-up was negative with an unchanged  EKG and troponins stable at 0.06 x 3. Infectious work-up was significant for a UA with leukocytes, nitrites, bacteria, and TNTC WBCs. Blood and urine cultures were drawn and were pending at discharge. Pt did not meet McGeer's criteria, but we were unable to assess urinary frequency and urgency in this Pt with dementia and incontinence at baseline. He was treated with Rocephin x 1 in the ED. We discharged him home on Ciprofloxacin x 7 days. This antibiotic should be discontinued if urine culture does not show any significant growth. We also checked a TSH because of his history of hypothyroidism and an ammonia level because of his history of hepatitis, both of which were normal. There was some concern that Pt may have been hypoglycemic at the nursing home because he became more responsive after administration of glucagon. After speaking with the nursing staff at [name], his CBG was 101 and 84 at the time he became unresponsive. Per nursing staff, Pt is usually alert when his blood sugars are in that range.  Urinary retention: We continued his home Flomax. We got a post-void residual, which was 153ml. Recommend monitoring this as an outpatient.  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 Baseline 2.0-2.5.   Issues for Follow Up:  1. Please follow-up his blood and urine cultures, which were pending at the time of discharge. If his urine culture does not show significant growth, can discontinue his antibiotics. Otherwise, he should take Ciprofloxacin 500mg  bid x 7 days. 2. There was some concern for hypoglycemia at the nursing home. Last A1c was 7.4%. Pt may not need such tight control. We discharged him on the following insulin regimen: Lantus 4 units  daily, Humalog 4 units tid with meals, and additional sliding scale as needed. Please continue to monitor his blood sugars and adjust his insulin regimen as needed.  3. Pt had a post-void residual of 133ml. Please  continue to monitor this in the nursing home.  Significant Procedures: None  Significant Labs and Imaging:   Recent Labs Lab 08/31/15 1332 09/01/15 0514  WBC 7.1 5.2  HGB 8.9* 8.9*  HCT 27.8* 27.6*  PLT 133* 141*    Recent Labs Lab 08/31/15 1332 09/01/15 0514  NA 140 142  K 3.7 3.9  CL 111 110  CO2 24 24  GLUCOSE 93 83  BUN 44* 43*  CREATININE 2.99* 2.73*  CALCIUM 8.4* 8.6*  ALKPHOS 102 90  AST 25 24  ALT 26 23  ALBUMIN 2.4* 2.2*   UA: many bacteria, moderate Hgb, large LE, positive nitrites, TNTC WBCs  Results/Tests Pending at Time of Discharge: Blood culture, urine culture  Discharge Medications:    Medication List    TAKE these medications        amLODipine 10 MG tablet  Commonly known as:  NORVASC  Take 1 tablet (10 mg total) by mouth daily.     atorvastatin 80 MG tablet  Commonly known as:  LIPITOR  Take 1 tablet (80 mg total) by mouth daily at 6 PM.     BRINTELLIX 10 MG Tabs  Generic drug:  vortioxetine HBr  Take 10 mg by mouth daily.     carvedilol 12.5 MG tablet  Commonly known as:  COREG  Take 1 tablet (12.5 mg total) by mouth 2 (two) times daily with a meal.     ciprofloxacin 500 MG tablet  Commonly known as:  CIPRO  Take 1 tablet (500 mg total) by mouth 2 (two) times daily.     clopidogrel 75 MG tablet  Commonly known as:  PLAVIX  Take 1 tablet (75 mg total) by mouth daily.     Elbasvir-Grazoprevir 50-100 MG Tabs  Commonly known as:  ZEPATIER  Take 1 tablet by mouth daily.     furosemide 40 MG tablet  Commonly known as:  LASIX  Take 1.5 tablets (60 mg total) by mouth 2 (two) times daily.     gabapentin 100 MG capsule  Commonly known as:  NEURONTIN  Take 100 mg by mouth 3 (three) times daily.     GLUCAGON EMERGENCY 1 MG injection  Generic drug:  glucagon  Inject 1 mg into the vein once as needed (hypoglycemia).     insulin glargine 100 UNIT/ML injection  Commonly known as:  LANTUS  Inject 0.04 mLs (4 Units total) into the  skin daily.     insulin lispro 100 UNIT/ML injection  Commonly known as:  HUMALOG  Inject 0-9 Units into the skin See admin instructions. Only sliding scale. 0-9 units     insulin lispro 100 UNIT/ML injection  Commonly known as:  HUMALOG  Inject 0.04 mLs (4 Units total) into the skin 3 (three) times daily with meals.     ketorolac 0.4 % Soln  Commonly known as:  ACULAR  Place 1 drop into the left eye 4 (four) times daily.     levothyroxine 100 MCG tablet  Commonly known as:  SYNTHROID, LEVOTHROID  Take 100 mcg by mouth daily before breakfast.     lisinopril 10 MG tablet  Commonly known as:  PRINIVIL,ZESTRIL  Take 1 tablet (10 mg total) by mouth daily.     LUMIGAN 0.01 % Soln  Generic drug:  bimatoprost  Place 1 drop into the right eye at bedtime.     NON FORMULARY  Take 120 mLs by mouth 3 (three) times daily.     OVER THE COUNTER MEDICATION  Take 1 scoop by mouth 2 (two) times daily. Protein powder mixed with food/drink (25 g of protein per serving) PURE PROTEIN     polyethylene glycol packet  Commonly known as:  MIRALAX / GLYCOLAX  Take 17 g by mouth 2 (two) times daily.     prednisoLONE acetate 1 % ophthalmic suspension  Commonly known as:  PRED FORTE  Place 1 drop into the left eye 4 (four) times daily.     PRESCRIPTION MEDICATION  Take 1 each by mouth 2 (two) times daily. Magic cup. Twice daily     sennosides-docusate sodium 8.6-50 MG tablet  Commonly known as:  SENOKOT-S  Take 2 tablets by mouth 2 (two) times daily.     tamsulosin 0.4 MG Caps capsule  Commonly known as:  FLOMAX  Take 0.8 mg by mouth at bedtime.        Discharge Instructions: Please refer to Patient Instructions section of EMR for full details.  Patient was counseled important signs and symptoms that should prompt return to medical care, changes in medications, dietary instructions, activity restrictions, and follow up appointments.   Follow-Up Appointments:     Follow-up Information     Follow up with [facility].   Specialty:  Emergency Medicine   Contact information:   [address] [id number] [place]      Sela Hua, MD 09/01/2015, 1:57 PM PGY-1, [facility]

## 2015-09-01 NOTE — Progress Notes (Signed)
Nsg Discharge Note  Admit Date:  08/31/2015 Discharge date: 09/01/2015   [name] to be D/C'd Skilled nursing facility per MD order.  AVS completed.  Copy for chart, and copy for patient signed, and dated. Patient/caregiver able to verbalize understanding.  Discharge Medication:   Medication List    TAKE these medications        amLODipine 10 MG tablet  Commonly known as:  NORVASC  Take 1 tablet (10 mg total) by mouth daily.     atorvastatin 80 MG tablet  Commonly known as:  LIPITOR  Take 1 tablet (80 mg total) by mouth daily at 6 PM.     BRINTELLIX 10 MG Tabs  Generic drug:  vortioxetine HBr  Take 10 mg by mouth daily.     carvedilol 12.5 MG tablet  Commonly known as:  COREG  Take 1 tablet (12.5 mg total) by mouth 2 (two) times daily with a meal.     ciprofloxacin 500 MG tablet  Commonly known as:  CIPRO  Take 1 tablet (500 mg total) by mouth 2 (two) times daily.     clopidogrel 75 MG tablet  Commonly known as:  PLAVIX  Take 1 tablet (75 mg total) by mouth daily.     Elbasvir-Grazoprevir 50-100 MG Tabs  Commonly known as:  ZEPATIER  Take 1 tablet by mouth daily.     furosemide 40 MG tablet  Commonly known as:  LASIX  Take 1.5 tablets (60 mg total) by mouth 2 (two) times daily.     gabapentin 100 MG capsule  Commonly known as:  NEURONTIN  Take 100 mg by mouth 3 (three) times daily.     GLUCAGON EMERGENCY 1 MG injection  Generic drug:  glucagon  Inject 1 mg into the vein once as needed (hypoglycemia).     insulin glargine 100 UNIT/ML injection  Commonly known as:  LANTUS  Inject 0.04 mLs (4 Units total) into the skin daily.     insulin lispro 100 UNIT/ML injection  Commonly known as:  HUMALOG  Inject 0-9 Units into the skin See admin instructions. Only sliding scale. 0-9 units     insulin lispro 100 UNIT/ML injection  Commonly known as:  HUMALOG  Inject 0.04 mLs (4 Units total) into the skin 3 (three) times daily with meals.     ketorolac 0.4 %  Soln  Commonly known as:  ACULAR  Place 1 drop into the left eye 4 (four) times daily.     levothyroxine 100 MCG tablet  Commonly known as:  SYNTHROID, LEVOTHROID  Take 100 mcg by mouth daily before breakfast.     lisinopril 10 MG tablet  Commonly known as:  PRINIVIL,ZESTRIL  Take 1 tablet (10 mg total) by mouth daily.     LUMIGAN 0.01 % Soln  Generic drug:  bimatoprost  Place 1 drop into the right eye at bedtime.     NON FORMULARY  Take 120 mLs by mouth 3 (three) times daily.     OVER THE COUNTER MEDICATION  Take 1 scoop by mouth 2 (two) times daily. Protein powder mixed with food/drink (25 g of protein per serving) PURE PROTEIN     polyethylene glycol packet  Commonly known as:  MIRALAX / GLYCOLAX  Take 17 g by mouth 2 (two) times daily.     prednisoLONE acetate 1 % ophthalmic suspension  Commonly known as:  PRED FORTE  Place 1 drop into the left eye 4 (four) times daily.     PRESCRIPTION  MEDICATION  Take 1 each by mouth 2 (two) times daily. Magic cup. Twice daily     sennosides-docusate sodium 8.6-50 MG tablet  Commonly known as:  SENOKOT-S  Take 2 tablets by mouth 2 (two) times daily.     tamsulosin 0.4 MG Caps capsule  Commonly known as:  FLOMAX  Take 0.8 mg by mouth at bedtime.        Discharge Assessment: Filed Vitals:   09/01/15 0759 09/01/15 1436  BP: 160/100 145/66  Pulse: 83 82  Temp:  98.8 F (37.1 C)  Resp: 18 18   Skin clean, dry and intact without evidence of skin break down, no evidence of skin tears noted. IV catheter discontinued intact. Site without signs and symptoms of complications - no redness or edema noted at insertion site, patient denies c/o pain - only slight tenderness at site.  Dressing with slight pressure applied.  D/c Instructions-Education: Discharge instructions given to patient/family with verbalized understanding. D/c education completed with patient/family including follow up instructions, medication list, d/c activities  limitations if indicated, with other d/c instructions as indicated by MD - patient able to verbalize understanding, all questions fully answered. Patient instructed to return to ED, call 911, or call MD for any changes in condition.  Patient transported via [facility] back to [facility], RN 09/01/2015 5:46 PM

## 2015-09-01 NOTE — Progress Notes (Signed)
Late entry for 08/31/15 2338:  Received patient from ED via stretcher. From [place], he is here due to AMS. He is positive for a UTI.  He has an extensive history. S/P 2 CVAs which left him weak on his left side.  He appears to have some contractures.  He would not talk with me or follow commands.  Due to this, he did not pass a bedside swallow, and is currently NPO. He is on telemetry# 25 in a sinus rhythm.  His BP has been extremely high, which we have been giving hydralazine prn.  He received 10 mg this am.  Bed alarm is on. Safety maintained. Delcie Roch, RN

## 2015-09-01 NOTE — Care Management Note (Signed)
Case Management Note  Patient Details  Name: Jaekwon Leseberg MRN: [id number] Date of Birth: October 06, 1945  Subjective/Objective:                 Patient admitted from SNF for AMD and UTI. Hx CVA. CSW following.   Action/Plan:  Anticipate DC to SNF when medically stable. DC facilitated by CSW.   Expected Discharge Date:  09/04/15               Expected Discharge Plan:  [place] (From Between)  In-House Referral:     Discharge planning Services     Post Acute Care Choice:    Choice offered to:     DME Arranged:    DME Agency:     HH Arranged:    [place] Agency:     Status of Service:     Medicare Important Message Given:    Date Medicare IM Given:    Medicare IM give by:    Date Additional Medicare IM Given:    Additional Medicare Important Message give by:     If discussed at [place] of Stay Meetings, dates discussed:    Additional Comments:  Carles Collet, RN 09/01/2015, 10:36 AM

## 2015-09-01 NOTE — Progress Notes (Signed)
Lab called.  Patient is positive for MRSA.  Precautions remain in effect.

## 2015-09-01 NOTE — Progress Notes (Signed)
Family Medicine Teaching Service Daily Progress Note Intern Pager: [phone]  Patient name: [name] Medical record number: [id number] Date of birth: June 19, 1945 Age: 70 y.o. Gender: male  Primary Care Provider: Cordelia Poche, MD Consultants: None Code Status: Full  Pt Overview and Major Events to Date:  6/8: Admitted to [facility] for AMS  Assessment and Plan: [name] is a 70 y.o. male presenting with altered mental status . PMH is significant for T2DM, Hypothyriodism, HLD, Hx of CVA (2008, 2016), HTN, Congestive dilated cardiomyopathy, CKD (stage IV), Chronic hepatitis C, Depression, Glaucoma   Altered Mental Status: Thought to be most likely secondary to UTI. Less concerned for cardiac source given normal telemetry overnight and normal EKG on admission. Repeat EKG this morning pending. Troponins 0.06 > 0.06 > 0.06. Pt with hx of hypothyroidism but TSH 1.445. Ammonia normal at 23. Stroke less likely with baseline neuro exam. CT head negative for acute abnormality. - Continue Ceftriaxone - Blood and urine cultures pending.  - Neuro checks q6hrs  - Pulse oximetry, sats above 92%  - NPO for now until Pt passes RN bedside swallow assessment.  Questionable Hypoglycemic Event? Spoke with nursing home this morning. Pt was given Lantus 6 units and Humalog 6 units in the morning. He ate breakfast really well. They checked his blood sugar at 1030 and it was 238. He was then unresponsive at 1230. They checked his blood sugar again and it was 101 in one hand and 84 in the other hand. He was given glucagon and he became responsive. CBG checked again and was 91. Per nursing home RN, he is normally completely alert when his blood sugars range between 80-100. - Doesn't seem like this is the cause of his AMS - See plan for DM below  UTI: UA positive for LE, nitrites, moderate hgb and >300 protein, indicative of possibly nephritic/nephrotic syndrome vs infection. Difficult to assess McGeer's  criteria, as Pt is demented at baseline. No fever or leukocytosis. Denies suprapubic pain. No gross hematuria. Unable to assess frequency or urgency. Pt incontinent at baseline. Abdominal exam benign. Past urine cultures grew MRSA and Citrobacter.  - Urine culture pending  - Continue Ceftriaxone for now.   Hx of urinary retention: - Will get a post-void residual today. - Continue Flomax  AoCKD (stage IV) Baseline Cr 2-2.5. Cr 2.99 on admission > 2.73 this morning. - Continue to follow BMET daily  - Hold lisinopril and lasix for now   Hypertensive urgency: BP 210/90 this morning > 160/100 after Hydralazine. - Continue home meds: Norvasc 10 mg, Coreg 12.5 mg daily  - Holding home Lisinopril and Lasix in the setting of AKI - Hydralazine 10mg  q4hrs prn  T2DM - Hold home insulin regimen Lantus 6 and Humalog 4-6 units TID. A1C 7.4%. - ACQHS  - Sensitive sliding scale  - Will add back Lantus as appropriate  Hypothyroidism, stable: TSH normal - Synthroid 100 mcg daily    Hx of multiple CVAs  - Continue Plavix 75 mg   Borderline HFrEF - ECHO EF of 45-50%, G1DD, CXR clear, no crackles on exam, 1+ pitting edema to knees - Continue Coreg 12.5 mg  - Holding lasix for now due kidney function. Will consider restarting later today or tomorrow. - BNP pending - Daily weights, strict I/O  Depression  - Continue Brintellix 10 mg daily   Glaucoma: per nursing home legally blind. Per Dr. Agustin Cree 2016 note - perceive light in left eye, unable to to perceive light in his right  eye. 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  - kertolac and prednislone drops in left eye - Brimapost in right eye   FEN/GI: Regular diet, swallow  Prophylaxis: Lovenox   Disposition: Telemetry   Subjective:  Pt denies any chest pain or abdominal pain this morning.  Objective: Temp:  [96 F (35.6 C)-97.7 F (36.5 C)] 97.4 F (36.3 C) (06/09 0544) Pulse Rate:  [63-78] 78 (06/09 0544) Resp:  [9-20] 16  (06/09 0544) BP: (162-225)/(86-104) 210/90 mmHg (06/09 0544) SpO2:  [97 %-100 %] 100 % (06/09 0544) Weight:  [141 lb (63.957 kg)-148 lb 5.9 oz (67.3 kg)] 148 lb 5.9 oz (67.3 kg) (06/09 0544) Physical Exam: General: Patient lying in bed, sleepy but easily arousable, in NAD HEENT: [place]/AT, EOMI, MMM Neck: No lymphadenopathy, no thyromegaly  Cardiovascular: RRR, no murmurs  Respiratory: CTAB, no wheezes on exam  Abdomen: BS+, no ttp, no distention  MSK: 1+ pitting edema in both legs, left leg contracted, patient unable to stretch it out  Skin: No ulcers or lesions noted  Neuro: 5/5 strength in RUE, 2/5 strength in LUE, tremor noted in LUE; Pt does not answer orientation questions.  Laboratory:  Recent Labs Lab 08/31/15 1332 09/01/15 0514  WBC 7.1 5.2  HGB 8.9* 8.9*  HCT 27.8* 27.6*  PLT 133* 141*    Recent Labs Lab 08/31/15 1332 09/01/15 0514  NA 140 142  K 3.7 3.9  CL 111 110  CO2 24 24  BUN 44* 43*  CREATININE 2.99* 2.73*  CALCIUM 8.4* 8.6*  PROT 5.9* 5.4*  BILITOT 0.4 0.4  ALKPHOS 102 90  ALT 26 23  AST 25 24  GLUCOSE 93 83   Troponins 0.06 > 0.06 > 0.06 TSH 1.445 Ammonia 23 UA: many bacteria, moderate Hgb, large leukocytes, positive nitrites, > 300 protein, 0-5 squams, TNTC WBC  Imaging/Diagnostic Tests: CXR (6/8): no active cardiopulmonary disease, borderline cardiomegaly CT head (6/8): No evidence of acute intracranial abnormality. Old right thalamic lacunar infarct.  Sela Hua, MD 09/01/2015, 7:04 AM PGY-1, [facility] Intern pager: [phone], text pages welcome

## 2015-09-01 NOTE — Progress Notes (Signed)
Last minute discharge: Patient is from [place] [facility]. Patient is disoriented. Per patient's daughter and wife, patient will return there by [facility].  [name] LCSWA [phone]

## 2015-09-01 NOTE — Progress Notes (Signed)
Patient did not pass stroke swallow screen.  He could not lick his lips, or follow directions.  Order will be placed for Bedside evaluation from speech.

## 2015-09-01 NOTE — Progress Notes (Signed)
Patient will DC to: [facility]  Anticipated DC date:  09/01/15 Family notified: Spouse in room Transport by: Corey Harold (may be running behind)   Per MD patient ready for DC to [facility] RN, patient, patient's family, and facility notified of DC. RN given number for report. DC packet on chart. Ambulance transport requested for patient.   CSW signing off.  Cedric Fishman, [place] Social Worker [phone]

## 2015-09-03 LAB — URINE CULTURE: Culture: >=100000 — AB

## 2015-09-04 ENCOUNTER — Non-Acute Institutional Stay: Payer: Medicare Other | Admitting: Family Medicine

## 2015-09-04 DIAGNOSIS — B182 Chronic viral hepatitis C: Secondary | ICD-10-CM | POA: Diagnosis not present

## 2015-09-04 DIAGNOSIS — N390 Urinary tract infection, site not specified: Secondary | ICD-10-CM | POA: Diagnosis not present

## 2015-09-04 DIAGNOSIS — E1159 Type 2 diabetes mellitus with other circulatory complications: Secondary | ICD-10-CM | POA: Diagnosis not present

## 2015-09-04 NOTE — Progress Notes (Signed)
[name], male   DOB: Nov 05, 1945, 70 y.o.   MRN: [id number] [facility]  Visit  Primary Care Provider: R. Nettey Location of Care: [facility] Visit Information: Admission Patient accompanied by patient Source(s) of information for visit: patient, nursing home and past medical records  Chief Complaint: No chief complaint on file.   Nursing Concerns: refusing medications including his insulins  Behavioral Concerns: : refusing medications including his insulins  Nutrition Concerns: none      Family Goals: LTC    HISTORY OF PRESENT ILLNESS: Outpatient Encounter Prescriptions as of 09/04/2015  Medication Sig  . amLODipine (NORVASC) 10 MG tablet Take 1 tablet (10 mg total) by mouth daily.  Marland Kitchen atorvastatin (LIPITOR) 80 MG tablet Take 1 tablet (80 mg total) by mouth daily at 6 PM.  . bimatoprost (LUMIGAN) 0.01 % SOLN Place 1 drop into the right eye at bedtime.  . carvedilol (COREG) 12.5 MG tablet Take 1 tablet (12.5 mg total) by mouth 2 (two) times daily with a meal.  . ciprofloxacin (CIPRO) 500 MG tablet Take 1 tablet (500 mg total) by mouth 2 (two) times daily.  . clopidogrel (PLAVIX) 75 MG tablet Take 1 tablet (75 mg total) by mouth daily.  . Elbasvir-Grazoprevir (ZEPATIER) 50-100 MG TABS Take 1 tablet by mouth daily.  . furosemide (LASIX) 40 MG tablet Take 1.5 tablets (60 mg total) by mouth 2 (two) times daily.  Marland Kitchen gabapentin (NEURONTIN) 100 MG capsule Take 100 mg by mouth 3 (three) times daily.   Marland Kitchen glucagon (GLUCAGON EMERGENCY) 1 MG injection Inject 1 mg into the vein once as needed (hypoglycemia).   . insulin glargine (LANTUS) 100 UNIT/ML injection Inject 0.04 mLs (4 Units total) into the skin daily.  . insulin lispro (HUMALOG) 100 UNIT/ML injection Inject 0-9 Units into the skin See admin instructions. Only sliding scale. 0-9 units  . insulin lispro (HUMALOG) 100 UNIT/ML injection Inject 0.04 mLs (4 Units total) into the skin 3 (three)  times daily with meals.  Marland Kitchen ketorolac (ACULAR) 0.4 % SOLN Place 1 drop into the left eye 4 (four) times daily.  Marland Kitchen levothyroxine (SYNTHROID, LEVOTHROID) 100 MCG tablet Take 100 mcg by mouth daily before breakfast.  . lisinopril (PRINIVIL,ZESTRIL) 10 MG tablet Take 1 tablet (10 mg total) by mouth daily.  . NON FORMULARY Take 120 mLs by mouth 3 (three) times daily.   Marland Kitchen OVER THE COUNTER MEDICATION Take 1 scoop by mouth 2 (two) times daily. Protein powder mixed with food/drink (25 g of protein per serving) PURE PROTEIN  . polyethylene glycol (MIRALAX / GLYCOLAX) packet Take 17 g by mouth 2 (two) times daily.  . prednisoLONE acetate (PRED FORTE) 1 % ophthalmic suspension Place 1 drop into the left eye 4 (four) times daily.  Marland Kitchen PRESCRIPTION MEDICATION Take 1 each by mouth 2 (two) times daily. Magic cup. Twice daily  . sennosides-docusate sodium (SENOKOT-S) 8.6-50 MG tablet Take 2 tablets by mouth 2 (two) times daily.   . tamsulosin (FLOMAX) 0.4 MG CAPS capsule Take 0.8 mg by mouth at bedtime.   . Vortioxetine HBr (BRINTELLIX) 10 MG TABS Take 10 mg by mouth daily.    Facility-Administered Encounter Medications as of 09/04/2015  Medication  . gatifloxacin (ZYMAXID) 0.5 % ophthalmic drops 1 drop  . phenylephrine (MYDFRIN) 2.5 % ophthalmic solution 1 drop   No Known Allergies History Patient Active Problem List   Diagnosis Date Noted  . Altered mental state 09/01/2015  . UTI (lower urinary tract infection)   .  Weakness   . Late effects of CVA (cerebrovascular accident)   . Faintness   . Altered mental status 08/31/2015  . Not immune to hepatitis B virus 06/27/2015  . Hypothyroidism 06/27/2015  . Glaucoma 02/01/2015  . Chronic hepatitis C without hepatic coma ([place]) 01/04/2015  . Person living in residential institution 12/28/2014  . Depression 12/28/2014  . HLD (hyperlipidemia) 06/16/2014  . Peripheral vision loss 06/16/2014  . 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 Chronic kidney disease (CKD), stage IV (severe) ([place]) 05/27/2014  . Essential hypertension   . Type 2 diabetes mellitus with other circulatory complications ([place])   . Congestive dilated cardiomyopathy ([place]) 04/11/2014  . History of cerebrovascular accident with residual effects    Past Medical History  Diagnosis Date  . Hypertension   . Hypercholesterolemia   . Glaucoma   . Cerebrovascular disease     a. 01/2007 MRI/A: L MCA A999333, R MCA [id number], LICA 123456.  . Chronic combined systolic and diastolic CHF, NYHA class 2 ([place])     a. 01/2007 Echo: EF 60-65%;  b. 03/2014 Echo: EF 45-50%, Gr 1 DD, severe LVH.  Marland Kitchen Headache   . Bedridden     transfers to chair  . Pneumonia 10/2006  . Hypothyroidism   . GERD (gastroesophageal reflux disease)   . Stroke ([place]) 01/2007    L corona radiata infarct; "left his right side weak" (09/16/2014)  . Stroke [facility]) 03/2014    "while in hospital; left side extremely weak since" (09/16/2014)  . Hepatitis dx'd 03/2014    C  . Type II diabetes mellitus (HCC)     Type II  . Depression   . CKD (chronic kidney disease), stage IV ([place])     [facility]  . History of cerebrovascular accident with residual effects     a. 01/2007 MRI/A: L MCA A999333, R MCA A999333, LICA 123456.   . Chronic kidney disease (CKD), stage IV (severe) ([place]) 05/27/2014  . Hepatitis C infection 01/04/2015    - Pt with HCV (+) antibody and HCV RNA Quant Log = 5.87 (H) (04/10/14)  . Hypoglycemia 02/06/2015  . AKI (acute kidney injury) [facility])    Past Surgical History  Procedure Laterality Date  . Colonoscopy w/ biopsies and polypectomy  2002  . Colon surgery      Polyps removed- surgically  . Trabeculectomy Left 08/18/2013    Procedure: TRABECULECTOMY WITH TUBE WITH [facility];  Surgeon: [name], MD;  Location: [place];  Service: Ophthalmology;  Laterality: Left;  . Colonoscopy  2015  . Cataract extraction extracapsular Left 12/21/2014    Procedure: CATARACT EXTRACTION EXTRACAPSULAR WITH  INTRAOCULAR LENS PLACEMENT LEFT EYE;  Surgeon: [name], MD;  Location: [place];  Service: Ophthalmology;  Laterality: Left;   Family History  Problem Relation Age of Onset  . Family history unknown: Yes    reports that he has never smoked. He has never used smokeless tobacco. He reports that he does not drink alcohol or use illicit drugs.  Basic Activities of Daily Living   ADLs Independent Needs Assistance Dependent  Bathing   x  Dressing   x  Ambulation   x  Toileting   x  Eating x        Diet:  NCS Feeding Tube: No  Hydration Status: well hydrated  Nutritional Supplements:  Medpass: no Magic Cup:no  Prostat:no  Juven:no   Communication Barriers: physical, visual, hearing, speech, cognitive and language  Review  of Systems  Patient has ability to communicate answers to ROS: no See HPI General: Denies fevers, chills, progressive fatigue, weight gain.  Eyes: Denies pain, blurred vision  Ears/Nose/Throat: Denies ear pain, throat pain, rhinorrhea, nasal congestion.  Cardiovascular: Denies chest pains, palpitations, dyspnea on exertion, orthopnea, peripheral edema.  Respiratory: Denies cough, sputum, dyspnea  Gastrointestinal: Denies abdominal pain, bloating, constipation, diarrhea.  Genitourinary: Denies dysuria, urinary frequency, discharge Musculoskeletal: Denies joint pain, swelling, weakness.  Skin: Denies skin rash or ulcers. Neurologic: Denies transient paralysis, weakness, paresthesias, headache.  Psychiatric: Denies depression, anxiety, psychosis. Endocrine: Denies weight loss    Geriatric Syndromes: C  PHYSICAL EXAM:. Wt Readings from Last 3 Encounters:  09/01/15 148 lb 5.9 oz (67.3 kg)  08/23/15 141 lb (63.957 kg)  05/25/15 133 lb 3.2 oz (60.419 kg)   Temp Readings from Last 3 Encounters:  09/01/15 98.8 F (37.1 C)   08/23/15 98.1 F (36.7 C) Oral  06/01/15 98.3 F (36.8 C)    BP Readings from Last 3 Encounters:  09/01/15 145/66  08/23/15  204/111  06/01/15 138/76   Pulse Readings from Last 3 Encounters:  09/01/15 82  08/23/15 87  06/01/15 69        Estimated GFR by CC = 25 mL/min Time to estimated GFR by CC of 15 mL/min (SCr = 4.4 mg/dL) ~ 3-4 months based on rate of change Time Vs 1/SCr above

## 2015-09-04 NOTE — Progress Notes (Signed)
[facility]  Visit  Primary Care Provider: McDiarmid  Location of Care: [facility] Visit Information: Re-admission  Patient accompanied by patient Source(s) of information for visit: patient and past medical records  Chief Complaint:  Chief Complaint  Patient presents with  . Establish Care    Nursing Concerns: none   Nutrition Concerns: none  Wound Care Nurse Concerns: none    HISTORY OF PRESENT ILLNESS:  [name] is a 70 yo M urinary retention, hypertension, CKD IV, T2DM, hypothyroidism, hx multiple CVAs, EF 45-50% with grade 1 DD, depression, and glaucoma who was sent to the ED from [facility] with altered mental status/unresponsiveness and a concern for a hypoglycemic event.  He was admitted on 6/8-6/9.  Head CT was negative and chest x-ray didn't show any acute changes.  His urinalysis showed leukocytes, nitrites and was treated with rocephin and started on ciprofloxacin for a 7 day course.    His last A1c was 7.4. His blood sugars at [facility] prior to admission were 101 and 84 at the time of him being unresponsive. He was discharged on Lantus 4 U QD, humalog 4 U TIDWC.   He had a history of urinary retention and had a post void residual of 198 mL while he was admitted.   He has history of CKD with an AKI during admission with his SCr elevated to 2.99. His baseline Scr is around 2.3.    Outpatient Encounter Prescriptions as of 09/04/2015  Medication Sig  . amLODipine (NORVASC) 10 MG tablet Take 1 tablet (10 mg total) by mouth daily.  Marland Kitchen atorvastatin (LIPITOR) 80 MG tablet Take 1 tablet (80 mg total) by mouth daily at 6 PM.  . bimatoprost (LUMIGAN) 0.01 % SOLN Place 1 drop into the right eye at bedtime.  . carvedilol (COREG) 12.5 MG tablet Take 1 tablet (12.5 mg total) by mouth 2 (two) times daily with a meal.  . ciprofloxacin (CIPRO) 500 MG tablet Take 1 tablet (500 mg total) by mouth 2 (two) times daily.  . clopidogrel (PLAVIX) 75 MG tablet  Take 1 tablet (75 mg total) by mouth daily.  . Elbasvir-Grazoprevir (ZEPATIER) 50-100 MG TABS Take 1 tablet by mouth daily.  . furosemide (LASIX) 40 MG tablet Take 1.5 tablets (60 mg total) by mouth 2 (two) times daily.  Marland Kitchen gabapentin (NEURONTIN) 100 MG capsule Take 100 mg by mouth 3 (three) times daily.   Marland Kitchen glucagon (GLUCAGON EMERGENCY) 1 MG injection Inject 1 mg into the vein once as needed (hypoglycemia).   . insulin glargine (LANTUS) 100 UNIT/ML injection Inject 0.04 mLs (4 Units total) into the skin daily.  . insulin lispro (HUMALOG) 100 UNIT/ML injection Inject 0-9 Units into the skin See admin instructions. Only sliding scale. 0-9 units  . insulin lispro (HUMALOG) 100 UNIT/ML injection Inject 0.04 mLs (4 Units total) into the skin 3 (three) times daily with meals.  Marland Kitchen ketorolac (ACULAR) 0.4 % SOLN Place 1 drop into the left eye 4 (four) times daily.  Marland Kitchen levothyroxine (SYNTHROID, LEVOTHROID) 100 MCG tablet Take 100 mcg by mouth daily before breakfast.  . lisinopril (PRINIVIL,ZESTRIL) 10 MG tablet Take 1 tablet (10 mg total) by mouth daily.  . NON FORMULARY Take 120 mLs by mouth 3 (three) times daily.   Marland Kitchen OVER THE COUNTER MEDICATION Take 1 scoop by mouth 2 (two) times daily. Protein powder mixed with food/drink (25 g of protein per serving) PURE PROTEIN  . polyethylene glycol (MIRALAX / GLYCOLAX) packet Take 17 g by mouth 2 (  two) times daily.  . prednisoLONE acetate (PRED FORTE) 1 % ophthalmic suspension Place 1 drop into the left eye 4 (four) times daily.  Marland Kitchen PRESCRIPTION MEDICATION Take 1 each by mouth 2 (two) times daily. Magic cup. Twice daily  . sennosides-docusate sodium (SENOKOT-S) 8.6-50 MG tablet Take 2 tablets by mouth 2 (two) times daily.   . tamsulosin (FLOMAX) 0.4 MG CAPS capsule Take 0.8 mg by mouth at bedtime.   . Vortioxetine HBr (BRINTELLIX) 10 MG TABS Take 10 mg by mouth daily.    Facility-Administered Encounter Medications as of 09/04/2015  Medication  . gatifloxacin  (ZYMAXID) 0.5 % ophthalmic drops 1 drop  . phenylephrine (MYDFRIN) 2.5 % ophthalmic solution 1 drop   No Known Allergies History Patient Active Problem List   Diagnosis Date Noted  . Altered mental state 09/01/2015  . UTI (lower urinary tract infection)   . Weakness   . Late effects of CVA (cerebrovascular accident)   . Faintness   . Altered mental status 08/31/2015  . Not immune to hepatitis B virus 06/27/2015  . Hypothyroidism 06/27/2015  . Glaucoma 02/01/2015  . Chronic hepatitis C without hepatic coma ([place]) 01/04/2015  . Person living in residential institution 12/28/2014  . Depression 12/28/2014  . HLD (hyperlipidemia) 06/16/2014  . Peripheral vision loss 06/16/2014  . 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 Chronic kidney disease (CKD), stage IV (severe) ([place]) 05/27/2014  . Essential hypertension   . Type 2 diabetes mellitus with other circulatory complications ([place])   . Congestive dilated cardiomyopathy ([place]) 04/11/2014  . History of cerebrovascular accident with residual effects    Past Medical History  Diagnosis Date  . Hypertension   . Hypercholesterolemia   . Glaucoma   . Cerebrovascular disease     a. 01/2007 MRI/A: L MCA A999333, R MCA A999333, LICA 123456.  . Chronic combined systolic and diastolic CHF, NYHA class 2 ([place])     a. 01/2007 Echo: EF 60-65%;  b. 03/2014 Echo: EF 45-50%, Gr 1 DD, severe LVH.  Marland Kitchen Headache   . Bedridden     transfers to chair  . Pneumonia 10/2006  . Hypothyroidism   . GERD (gastroesophageal reflux disease)   . Stroke ([place]) 01/2007    L corona radiata infarct; "left his right side weak" (09/16/2014)  . Stroke [facility]) 03/2014    "while in hospital; left side extremely weak since" (09/16/2014)  . Hepatitis dx'd 03/2014    C  . Type II diabetes mellitus (HCC)     Type II  . Depression   . CKD (chronic kidney disease), stage IV ([place])     [facility]  . History of cerebrovascular accident with  residual effects     a. 01/2007 MRI/A: L MCA A999333, R MCA [id number], LICA 123456.   . Chronic kidney disease (CKD), stage IV (severe) ([place]) 05/27/2014  . Hepatitis C infection 01/04/2015    - Pt with HCV (+) antibody and HCV RNA Quant Log = 5.87 (H) (04/10/14)  . Hypoglycemia 02/06/2015  . AKI (acute kidney injury) [facility])    Past Surgical History  Procedure Laterality Date  . Colonoscopy w/ biopsies and polypectomy  2002  . Colon surgery      Polyps removed- surgically  . Trabeculectomy Left 08/18/2013    Procedure: TRABECULECTOMY WITH TUBE WITH [facility];  Surgeon: [name], MD;  Location: [place];  Service: Ophthalmology;  Laterality: Left;  . Colonoscopy  2015  . Cataract extraction extracapsular Left  12/21/2014    Procedure: CATARACT EXTRACTION EXTRACAPSULAR WITH INTRAOCULAR LENS PLACEMENT LEFT EYE;  Surgeon: [name], MD;  Location: [place];  Service: Ophthalmology;  Laterality: Left;   Family History  Problem Relation Age of Onset  . Family history unknown: Yes    reports that he has never smoked. He has never used smokeless tobacco. He reports that he does not drink alcohol or use illicit drugs.  Basic Activities of Daily Living   ADLs Independent Needs Assistance Dependent  Bathing   x  Dressing   x  Ambulation   x  Toileting   x  Eating  x      Instrumental Activities of Daily Living  IADL Independent Needs Assistance Dependent  Cooking   x  Housework   x  Manage Medications   x  Manage the telephone   x  Shopping for food, clothes, Meds, etc   x  Use transportation   x  Manage Finances   x    Falls in the past six months:   no  Diet:  diabetic, regular calorie  Review of Systems  Patient has ability to communicate answers to ROS: yes See HPI  Geriatric Syndromes: Constipation no  Incontinence yes  Dizziness no   Syncope no   Skin problems no   Visual Impairment no   Hearing impairment yes  Eating impairment no  Impaired Memory or Cognition no   Behavioral  problems yes, has been refusing his insulin injections and fighting with nursing staff  Sleep problems no   Weight loss no    Pain: none  Dyspnea: none  General: Denies fevers, chills, progressive fatigue, weight gain.  Eyes: Denies pain, blurred vision  Ears/Nose/Throat: Denies ear pain, throat pain, rhinorrhea, nasal congestion.  Cardiovascular: Denies chest pains, palpitations, dyspnea on exertion, orthopnea, peripheral edema.  Respiratory: Denies cough, sputum, dyspnea  Gastrointestinal: Denies abdominal pain, bloating, constipation, diarrhea.  Genitourinary: Denies dysuria, urinary frequency, discharge Musculoskeletal: Denies joint pain, swelling, weakness.  Skin: Denies skin rash or ulcers. Neurologic: Denies transient paralysis, weakness, paresthesias, headache.  Psychiatric: Denies depression, anxiety, psychosis. Endocrine: Denies weight loss   PHYSICAL EXAM:. Wt Readings from Last 3 Encounters:  09/07/15 144 lb 9.6 oz (65.59 kg)  09/01/15 148 lb 5.9 oz (67.3 kg)  08/23/15 141 lb (63.957 kg)   Temp Readings from Last 3 Encounters:  09/07/15 98.4 F (36.9 C)   09/01/15 98.8 F (37.1 C)   08/23/15 98.1 F (36.7 C) Oral   BP Readings from Last 3 Encounters:  09/07/15 171/104  09/01/15 145/66  08/23/15 204/111   Pulse Readings from Last 3 Encounters:  09/07/15 78  09/01/15 82  08/23/15 87    General: alert, well nourished, pleasant, clean, groomed HEENT:  No scleral icterus, no nasal secretions, EACs not occluded, TMs clear bilat, [place] moist and no erythema or lesion Neck:  Supple, No JVD, no lymphadenopathy CV:  RRR, no murmur, no ankle swelling RESP: No resp distress or accessory muscle use.  Clear to ausc bilat. No wheezing, no rales, no rhonchi.  ABD:  Soft, Non-tender, non-distended, +bowel sounds, no masses MSK:  No back pain, no joint pain.  No joint swelling or redness EXT: Warm and well perfused   no edema, no erythema, pulses WNL Gait:  Non  ambulatory Skin: hell pressure ulcer scar intact  Neurologic:Cranial nerves normal;  Muscle Tone within normal limits; Motor Strength: weakness of  both leg(s): entire area 2/5, RUE 5/5, LUE 4/5  Sensation:  WFL by patient report; Cerebellar: no tremors noted; DTRs: 2+ bilaterally ; Psych:  Orientation oriented to person, place, time, and general circumstances;   Attention Normal;  Mood appropriate; Speech normal; Language none ; Thought Coherent   No flowsheet data found. No flowsheet data found.  Years of Education: 16 +  Assessment and Plan:   See Problem List for individual problem's assessment and plans.   Family communications: unable to reach his wife    Advanced Directives (MOST form, Living Will, HCPOA): unknown  Code Status:    Full  Emergency contact:  Wife, Jordan Hawks   [phone], [phone] Follow Up:  Next 7 days unless acute issues arise.     Rosemarie Ax, MD PGY-3, [facility] 09/07/2015, 11:33 AM

## 2015-09-05 LAB — CULTURE, BLOOD (ROUTINE X 2)
CULTURE: NO GROWTH
CULTURE: NO GROWTH

## 2015-09-07 NOTE — Assessment & Plan Note (Signed)
Has been fighting his insulin injections by nursing staff  Increased lantus to 5 U  Humalog 4 U TIDWC  - continue to monitor.

## 2015-09-07 NOTE — Assessment & Plan Note (Signed)
His urine culture grew MRSA and discussed with Dr. Linus Salmons (ID) who thought it was contaminant Was started on cipro based on the sensitivities of prior cultures  Cirpo was discontinued and he will continue to be monitored.

## 2015-09-07 NOTE — Assessment & Plan Note (Signed)
Has been evaluated by ID and they suggested a referral to GI  Has an elastography scheduled in July  - referral to GI placed.

## 2015-09-08 ENCOUNTER — Encounter: Payer: Self-pay | Admitting: Family Medicine

## 2015-09-11 ENCOUNTER — Encounter: Payer: Self-pay | Admitting: Family Medicine

## 2015-09-11 DIAGNOSIS — N049 Nephrotic syndrome with unspecified morphologic changes: Secondary | ICD-10-CM | POA: Insufficient documentation

## 2015-09-11 DIAGNOSIS — I69319 Unspecified symptoms and signs involving cognitive functions following cerebral infarction: Secondary | ICD-10-CM | POA: Insufficient documentation

## 2015-09-11 DIAGNOSIS — D631 Anemia in chronic kidney disease: Secondary | ICD-10-CM | POA: Insufficient documentation

## 2015-09-11 DIAGNOSIS — G8194 Hemiplegia, unspecified affecting left nondominant side: Secondary | ICD-10-CM | POA: Insufficient documentation

## 2015-09-11 DIAGNOSIS — N189 Chronic kidney disease, unspecified: Secondary | ICD-10-CM

## 2015-09-11 LAB — HM DIABETES EYE EXAM

## 2015-09-18 ENCOUNTER — Encounter: Payer: Self-pay | Admitting: Pharmacist

## 2015-09-18 NOTE — Progress Notes (Signed)
   09/01/15 1036  SLP G-Codes **NOT FOR INPATIENT CLASS**  Functional Assessment Tool Used skilled clinical judgement via chart review  Functional Limitations Swallowing  Swallow Current Status [id number]) [place]  Swallow Goal Status [id number]) [facility]  Swallow Discharge Status [id number]) [place]  SLP Evaluations  $ SLP Speech Visit 1 Procedure  SLP Evaluations  $BSS Swallow 1 Procedure  Gabriel Rainwater MA, CCC-SLP [phone]

## 2015-09-20 ENCOUNTER — Non-Acute Institutional Stay: Payer: Medicare Other | Admitting: Family Medicine

## 2015-09-20 ENCOUNTER — Encounter: Payer: Self-pay | Admitting: Family Medicine

## 2015-09-20 DIAGNOSIS — E1159 Type 2 diabetes mellitus with other circulatory complications: Secondary | ICD-10-CM

## 2015-09-20 DIAGNOSIS — R140 Abdominal distension (gaseous): Secondary | ICD-10-CM

## 2015-09-20 DIAGNOSIS — E039 Hypothyroidism, unspecified: Secondary | ICD-10-CM

## 2015-09-20 DIAGNOSIS — K7469 Other cirrhosis of liver: Secondary | ICD-10-CM

## 2015-09-20 DIAGNOSIS — B182 Chronic viral hepatitis C: Secondary | ICD-10-CM

## 2015-09-20 DIAGNOSIS — I10 Essential (primary) hypertension: Secondary | ICD-10-CM

## 2015-09-20 DIAGNOSIS — N390 Urinary tract infection, site not specified: Secondary | ICD-10-CM

## 2015-09-20 DIAGNOSIS — K7460 Unspecified cirrhosis of liver: Secondary | ICD-10-CM | POA: Insufficient documentation

## 2015-09-20 NOTE — Assessment & Plan Note (Signed)
Patient currently followed by infectious disease. He was on Zepatier but appears he cannot afford this. Social work working with patient to figure with patient to afford medication. Will defer to infectious disease for continuation of treatment.

## 2015-09-20 NOTE — Assessment & Plan Note (Signed)
Possibly attributing to abdominal distention and possible ascites. Patient has follow-up with gastroenterology.

## 2015-09-20 NOTE — Assessment & Plan Note (Signed)
Last TSH within normal limits. Note changes to therapy. Continue levothyroxine 100 g daily.

## 2015-09-20 NOTE — Progress Notes (Signed)
[name], male   DOB: 07-12-1945, 70 y.o.   MRN: [id number] Completion and fax'd Prior Authorization for Zepatier for Chronic Hepatitis C (genotype 1b) to [employer]. PA form to be scanned into EMR.

## 2015-09-20 NOTE — Assessment & Plan Note (Signed)
Abdomen is nontender with bowel sounds present. Patient afebrile. No nausea or vomiting. Distention is likely secondary to ascites which is likely secondary to history of cirrhosis. Patient has a history of low albumin. Last albumin of 2.6 earlier this month. Patient is already being plugged in with gastroenterology for follow-up of his cirrhosis. Will keep an eye out for developing symptoms.

## 2015-09-20 NOTE — Progress Notes (Signed)
[name], male   DOB: 1945/07/08, 70 y.o.   MRN: [id number] [facility]  Visit  Primary Care Provider: R. Lonny Prude, MD Location of Care: [facility] Visit Information: a scheduled routine follow-up visit Patient accompanied by patient Source(s) of information for visit: patient, nursing home and past medical records and Labs from [facility]  Chief Complaint:  No chief complaint on file.   Nursing Concerns: None Nutrition Concerns: rapid weight loss   Wound Care Nurse Concerns: None  PT Concerns and Goals: Concerns None;  Patient is not receiving PT/OT;  -  Patient is Intermediate Care Facility status  Family Goals: nursing and custodial care    HISTORY OF PRESENT ILLNESS: Patient initially admitted to [facility] because wife was not able to continue to care for him. He was recently admitted to [facility] for hypoglycemia. Since discharge from the hospital and readmission to [facility], he has been doing well. He reports no complaints when speaking with him.  Patient readmitted on 6/8 after a 2 day admission at [facility] for concern for UTI.  HYPERTENSION  Disease Monitoring: Blood pressure range- blood pressure not very well controlled, however, recent value is in a good range.  Chest pain- no      Dyspnea- no  Medications: Currently on amlodipine 10mg  qd, coreg 12.5mg  BID, lisinopril 10mg  qd Compliance- yes Lightheadedness- no   Edema- no   DIABETES  Disease Monitoring: Fasting morning blood sugars ranging from 100s to a max of 450s. No hypoglycemia. Patient continues to sometimes decline treatment. Lantus increased to 5u daily. Patient continues taking Humalog 4u TID with meals, up from 3u.    Polyuria- no  New Visual problems- no change in baseline  Medications: Compliance- Lantus decreased to Lantus 5 units qd and Humalog blood correction for meal coverage and Humalog sliding scale. Patient continues to  intermittently decline treatment per nursing but seems compliant with Lantus   HYPERLIPIDEMIA  Disease Monitoring: See symptoms for Hypertension  Medications: Compliance- yes RUQ pain- no  Muscle aches- no   HYPOTHYROIDISM  Disease Monitoring: Last TSH of 1.445 on 09/01/2015.  Medications: Levothyroxine 139mcg daily  Compliance: Patient is compliant with regimen.  ROS See HPI above   PMH  Social History  Substance Use Topics  . Smoking status: Never Smoker   . Smokeless tobacco: Never Used  . Alcohol Use: No    Outpatient Encounter Prescriptions as of 09/20/2015  Medication Sig  . amLODipine (NORVASC) 10 MG tablet Take 1 tablet (10 mg total) by mouth daily.  Marland Kitchen atorvastatin (LIPITOR) 80 MG tablet Take 1 tablet (80 mg total) by mouth daily at 6 PM.  . bimatoprost (LUMIGAN) 0.01 % SOLN Place 1 drop into the right eye at bedtime.  . carvedilol (COREG) 12.5 MG tablet Take 1 tablet (12.5 mg total) by mouth 2 (two) times daily with a meal.  . clopidogrel (PLAVIX) 75 MG tablet Take 1 tablet (75 mg total) by mouth daily.  Derrill Memo ON 09/22/2015] Elbasvir-Grazoprevir (ZEPATIER PO) Take 1 tablet by mouth daily.  . furosemide (LASIX) 40 MG tablet Take 1.5 tablets (60 mg total) by mouth 2 (two) times daily.  Marland Kitchen gabapentin (NEURONTIN) 100 MG capsule Take 100 mg by mouth 3 (three) times daily.   Marland Kitchen glucagon (GLUCAGON EMERGENCY) 1 MG injection Inject 1 mg into the vein once as needed (hypoglycemia).   . insulin glargine (LANTUS) 100 UNIT/ML injection Inject 0.04 mLs (4 Units total) into the skin daily. (Patient taking  differently: Inject 5 Units into the skin daily. )  . insulin lispro (HUMALOG) 100 UNIT/ML injection Inject 0-9 Units into the skin See admin instructions. Only sliding scale. 0-9 units  . insulin lispro (HUMALOG) 100 UNIT/ML injection Inject 0.04 mLs (4 Units total) into the skin 3 (three) times daily with meals.  Marland Kitchen ketorolac (ACULAR) 0.4 % SOLN Place 1 drop into the left eye  4 (four) times daily.  Marland Kitchen levothyroxine (SYNTHROID, LEVOTHROID) 100 MCG tablet Take 100 mcg by mouth daily before breakfast.  . lisinopril (PRINIVIL,ZESTRIL) 10 MG tablet Take 1 tablet (10 mg total) by mouth daily.  . NON FORMULARY Take 120 mLs by mouth 3 (three) times daily.   Marland Kitchen OVER THE COUNTER MEDICATION Take 1 scoop by mouth 2 (two) times daily. Protein powder mixed with food/drink (25 g of protein per serving) PURE PROTEIN  . polyethylene glycol (MIRALAX / GLYCOLAX) packet Take 17 g by mouth 2 (two) times daily.  . prednisoLONE acetate (PRED FORTE) 1 % ophthalmic suspension Place 1 drop into the left eye 4 (four) times daily.  Marland Kitchen PRESCRIPTION MEDICATION Take 1 each by mouth 2 (two) times daily. Magic cup. Twice daily  . sennosides-docusate sodium (SENOKOT-S) 8.6-50 MG tablet Take 2 tablets by mouth 2 (two) times daily.   . tamsulosin (FLOMAX) 0.4 MG CAPS capsule Take 0.8 mg by mouth at bedtime.   . Vortioxetine HBr (BRINTELLIX) 10 MG TABS Take 10 mg by mouth daily.    Facility-Administered Encounter Medications as of 09/20/2015  Medication  . gatifloxacin (ZYMAXID) 0.5 % ophthalmic drops 1 drop  . phenylephrine (MYDFRIN) 2.5 % ophthalmic solution 1 drop   No Known Allergies   History Patient Active Problem List   Diagnosis Date Noted  . Liver cirrhosis ([place]) 09/20/2015  . Question of Cognitive impairment 09/11/2015  . Nephrotic syndrome 09/11/2015  . Anemia of renal disease 09/11/2015  . Left hemiparesis ([place]) 09/11/2015  . UTI (lower urinary tract infection)   . Late effects of CVA (cerebrovascular accident)   . Not immune to hepatitis B virus 06/27/2015  . Hypothyroidism 06/27/2015  . Glaucoma 02/01/2015  . Chronic hepatitis C without hepatic coma ([place]) 01/04/2015  . Person living in residential institution 12/28/2014  . Depression 12/28/2014  . HLD (hyperlipidemia) 06/16/2014  . Peripheral vision loss 06/16/2014  . 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 Chronic kidney disease (CKD), stage IV (severe) ([place]) 05/27/2014  . Essential hypertension   . Type 2 diabetes mellitus with other circulatory complications ([place])   . Congestive dilated cardiomyopathy ([place]) 04/11/2014  . History of cerebrovascular accident with residual effects    Past Medical History  Diagnosis Date  . Hypertension   . Hypercholesterolemia   . Glaucoma   . Cerebrovascular disease     a. 01/2007 MRI/A: L MCA A999333, R MCA A999333, LICA 123456.  . Chronic combined systolic and diastolic CHF, NYHA class 2 ([place])     a. 01/2007 Echo: EF 60-65%;  b. 03/2014 Echo: EF 45-50%, Gr 1 DD, severe LVH.  Marland Kitchen Headache   . Bedridden     transfers to chair  . Pneumonia 10/2006  . Hypothyroidism   . GERD (gastroesophageal reflux disease)   . Stroke ([place]) 01/2007    L corona radiata infarct; "left his right side weak" (09/16/2014)  . Stroke [facility]) 03/2014    "while in hospital; left side extremely weak since" (09/16/2014)  . Hepatitis dx'd 03/2014    C  .  Type II diabetes mellitus (HCC)     Type II  . Depression   . CKD (chronic kidney disease), stage IV ([place])     [facility]  . History of cerebrovascular accident with residual effects     a. 01/2007 MRI/A: L MCA A999333, R MCA A999333, LICA 123456.   . Chronic kidney disease (CKD), stage IV (severe) ([place]) 05/27/2014  . Hepatitis C infection 01/04/2015    - Pt with HCV (+) antibody and HCV RNA Quant Log = 5.87 (H) (04/10/14)  . Hypoglycemia 02/06/2015  . AKI (acute kidney injury) ([place])   . Altered mental status 08/31/2015   Past Surgical History  Procedure Laterality Date  . Colonoscopy w/ biopsies and polypectomy  2002  . Colon surgery      Polyps removed- surgically  . Trabeculectomy Left 08/18/2013    Procedure: TRABECULECTOMY WITH TUBE WITH [facility];  Surgeon: [name], MD;  Location: [place];  Service: Ophthalmology;  Laterality: Left;  . Colonoscopy  2015  . Cataract extraction extracapsular Left 12/21/2014     Procedure: CATARACT EXTRACTION EXTRACAPSULAR WITH INTRAOCULAR LENS PLACEMENT LEFT EYE;  Surgeon: [name], MD;  Location: [place];  Service: Ophthalmology;  Laterality: Left;   Family History  Problem Relation Age of Onset  . Family history unknown: Yes    reports that he has never smoked. He has never used smokeless tobacco. He reports that he does not drink alcohol or use illicit drugs.  Basic Activities of Daily Living   ADLs Independent Needs Assistance Dependent  Bathing   x  Dressing   x  Ambulation   x  Toileting   x  Eating x       Instrumental Activities of Daily Living  IADL Independent Needs Assistance Dependent  Cooking   x  Housework   x  Manage Medications   x  Manage the telephone   x  Shopping for food, clothes, Meds, etc   x  Use transportation   x  Manage Finances   x    Falls in the past six months:   no  Diet:  diabetic, - calorie  Nourishment: adequate   Nutritional Supplements:  Medpass: yes Magic Cup:no  Prostat:yes  Juven:no    Review of Systems  Patient has ability to communicate answers to ROS: yes See HPI  Geriatric Syndromes: Constipation yes ,   Incontinence no  Dizziness no   Syncope no   Skin problems no   Visual Impairment yes   Hearing impairment no  Eating impairment no  Impaired Memory or Cognition no   Behavioral problems no   Sleep problems no   Weight loss no    Pain:  None  Dyspnea: None  PHYSICAL EXAM:. Wt Readings from Last 3 Encounters:  09/20/15 158 lb (71.668 kg)  09/07/15 144 lb 9.6 oz (65.59 kg)  09/01/15 148 lb 5.9 oz (67.3 kg)   Temp Readings from Last 3 Encounters:  09/20/15 98.3 F (36.8 C)   09/07/15 98.4 F (36.9 C)   09/01/15 98.8 F (37.1 C)    BP Readings from Last 3 Encounters:  09/20/15 140/55  09/07/15 171/104  09/01/15 145/66   Pulse Readings from Last 3 Encounters:  09/20/15 72  09/07/15 78  09/01/15 82   General: Lying in bed, flat, no distress HEENT: No scleral  icterus, no nasal secretions, external ear appears normal, [place] moist and no erythema or lesion. Very poor dentition Neck: Supple, No JVD, no lymphadenopathy [id number] rate  and rhythm. Normal S1 and S2. No heart murmurs present. No extra heart sounds. No ankle edema RESP: No resp distress or accessory muscle use. Clear to auscultation bilaterally. No wheezing, no rales, no rhonchi.  ABD: Soft, Non-tender, slightly distended, +bowel sounds, no masses MSK:No back pain, no joint pain. No joint swelling or redness EXT: Warm and well perfused no edema, no erythema, pulses WNL Gait: Non ambulatory Skin: healed right heel pressure ulcer Neuro: alert and oriented to person only. Easy to arouse from sleep  No flowsheet data found. No flowsheet data found.  Years of Education: 16 +  Assessment and Plan:    Abdominal distension Abdomen is nontender with bowel sounds present. Patient afebrile. No nausea or vomiting. Distention is likely secondary to ascites which is likely secondary to history of cirrhosis. Patient has a history of low albumin. Last albumin of 2.6 earlier this month. Patient is already being plugged in with gastroenterology for follow-up of his cirrhosis. Will keep an eye out for developing symptoms.  Hypothyroidism Last TSH within normal limits. Note changes to therapy. Continue levothyroxine 100 g daily.  Chronic hepatitis C without hepatic coma ([place]) Patient currently followed by infectious disease. He was on Zepatier but appears he cannot afford this. Social work working with patient to figure with patient to afford medication. Will defer to infectious disease for continuation of treatment.  Liver cirrhosis ([place]) Possibly attributing to abdominal distention and possible ascites. Patient has follow-up with gastroenterology.  Type 2 diabetes mellitus with other circulatory complications ([place]) Patient recently decreased to Lantus 5u daily. Appears to have poor  glycemic control per daily blood sugar logs. Last A1C in January of 7.4. Goal is to have A1C less than 8 in this patient as he has had issues with hypoglycemia in the past. What he needs is better daytime control, however, continues to be inconsistent in accepting treatment. I discussed this with him and hopefully he changes, however, with his dementia, I doubt my conversation will have lasting effect. No changes to his current regimen.    Code Status:    Full Code   Emergency contact:   [place], Ullery [phone]  Address: [place] [address]            [place] Daughter [phone]  [phone]     Follow Up:  Next 60 days unless acute issues arise.

## 2015-09-21 ENCOUNTER — Encounter: Payer: Self-pay | Admitting: Family Medicine

## 2015-09-22 ENCOUNTER — Encounter: Payer: Self-pay | Admitting: Family Medicine

## 2015-09-22 MED ORDER — INSULIN GLARGINE 100 UNIT/ML ~~LOC~~ SOLN: 5.0000 [IU] | Freq: Every day | SUBCUTANEOUS | Status: DC

## 2015-09-22 NOTE — Assessment & Plan Note (Signed)
Last reading in an adequate range. No changes to regimen.

## 2015-09-22 NOTE — Assessment & Plan Note (Signed)
Patient recently decreased to Lantus 5u daily. Appears to have poor glycemic control per daily blood sugar logs. Last A1C in January of 7.4. Goal is to have A1C less than 8 in this patient as he has had issues with hypoglycemia in the past. What he needs is better daytime control, however, continues to be inconsistent in accepting treatment. I discussed this with him and hopefully he changes, however, with his dementia, I doubt my conversation will have lasting effect. No changes to his current regimen.

## 2015-09-25 ENCOUNTER — Encounter: Payer: Self-pay | Admitting: Family Medicine

## 2015-09-25 NOTE — Progress Notes (Signed)
This encounter was created in error - please disregard.

## 2015-09-25 NOTE — Progress Notes (Signed)
[name], male   DOB: [birth year], 70 y.o.   MRN: [id number] I have interviewed and examined the patient.  I have discussed the case and verified the key findings with Dr. Lonny Prude.   I agree with their assessments and plans as documented in their visit note.   Patient's CBGs very sensitive to insulin and caloric nutritional intake. Glycemic goal is fair to good glycemic control without hypoglycemia given his clinical situation of of moderate to high risk of hypoglycemia in patient with several disabilities,  impaired cognitive functioning from CVA, and multiple co-morbid states including hx of CVA, hypertension, diabetes, and Chronic Viral Hepatitis.

## 2015-09-25 NOTE — Addendum Note (Signed)
Addended byWendy Poet, Nichalas Coin D on: 09/25/2015 11:13 AM   Modules accepted: Level of Service

## 2015-10-03 ENCOUNTER — Ambulatory Visit (HOSPITAL_COMMUNITY)
Admission: RE | Admit: 2015-10-03 | Discharge: 2015-10-03 | Disposition: A | Discharge status: Home / Self Care | Payer: Medicare Other | Source: Ambulatory Visit | Attending: Internal Medicine | Admitting: Internal Medicine

## 2015-10-03 DIAGNOSIS — B182 Chronic viral hepatitis C: Secondary | ICD-10-CM

## 2015-10-25 ENCOUNTER — Other Ambulatory Visit: Payer: Self-pay | Admitting: Family Medicine

## 2015-10-26 LAB — HEMOGLOBIN A1C: HEMOGLOBIN A1C: 8.3

## 2015-11-10 ENCOUNTER — Encounter: Payer: Self-pay | Admitting: Pharmacist

## 2015-11-13 ENCOUNTER — Encounter: Payer: Self-pay | Admitting: Pharmacist

## 2015-11-22 ENCOUNTER — Non-Acute Institutional Stay: Payer: Medicare Other | Admitting: Student

## 2015-11-22 DIAGNOSIS — N189 Chronic kidney disease, unspecified: Secondary | ICD-10-CM

## 2015-11-22 DIAGNOSIS — E1159 Type 2 diabetes mellitus with other circulatory complications: Secondary | ICD-10-CM | POA: Diagnosis not present

## 2015-11-22 DIAGNOSIS — E039 Hypothyroidism, unspecified: Secondary | ICD-10-CM

## 2015-11-22 DIAGNOSIS — I10 Essential (primary) hypertension: Secondary | ICD-10-CM

## 2015-11-22 DIAGNOSIS — B182 Chronic viral hepatitis C: Secondary | ICD-10-CM

## 2015-11-22 DIAGNOSIS — E785 Hyperlipidemia, unspecified: Secondary | ICD-10-CM

## 2015-11-22 DIAGNOSIS — F329 Major depressive disorder, single episode, unspecified: Secondary | ICD-10-CM

## 2015-11-22 DIAGNOSIS — I420 Dilated cardiomyopathy: Secondary | ICD-10-CM

## 2015-11-22 DIAGNOSIS — K7469 Other cirrhosis of liver: Secondary | ICD-10-CM

## 2015-11-22 DIAGNOSIS — I63031 Cerebral infarction due to thrombosis of right carotid artery: Secondary | ICD-10-CM

## 2015-11-22 DIAGNOSIS — B353 Tinea pedis: Secondary | ICD-10-CM

## 2015-11-22 DIAGNOSIS — F32A Depression, unspecified: Secondary | ICD-10-CM

## 2015-11-22 DIAGNOSIS — N049 Nephrotic syndrome with unspecified morphologic changes: Secondary | ICD-10-CM

## 2015-11-22 DIAGNOSIS — D631 Anemia in chronic kidney disease: Secondary | ICD-10-CM

## 2015-11-22 DIAGNOSIS — N400 Enlarged prostate without lower urinary tract symptoms: Secondary | ICD-10-CM

## 2015-11-22 NOTE — Progress Notes (Signed)
Subjective:    [name] is a 70 y.o. old male.  HPI   Patient has no concern today. He denies chest pain or shortness of breath. His only complaint was "people wake me up every morning to take my blood". Patient was originally from [place]. He was happy when I told him I was from [place]. He was even happier when he learned that I have a [place] name although I am not from [place].   Attempted to call patient's wife twice to discuss about the patient. Unfortunately, she didn't pick her phone both times.   PMH: reviewed and updated SH: states his wife lives in [place] and works as Marine scientist. He has two sons who lives in [place].  Review of Systems  Constitutional: Negative for appetite change.  HENT: Negative for trouble swallowing.   Eyes: Positive for visual disturbance.  Respiratory: Negative for cough, chest tightness and shortness of breath.   Cardiovascular: Positive for leg swelling. Negative for chest pain and palpitations.  Gastrointestinal: Negative for abdominal pain, constipation and diarrhea.  Genitourinary: Negative for dysuria.  Musculoskeletal: Negative for arthralgias, back pain and myalgias.  Skin: Negative for rash and wound.  Neurological: Negative for light-headedness and headaches.  Psychiatric/Behavioral: The patient is not nervous/anxious.    Per HPI Objective:  There were no vitals filed for this visit.  GEN: wheelchair bound, intermittently confused but pleasant HEENT:   Head: normocephalic and atraumatic,   Eyes: without conjunctival injection, sclera anicteric, vision grossly intact  Ears: responds to sound stimuli appropriately, no apparent lesion   Oropharynx: mmm without erythema or exudation. Poor dentition CVS: RRR, normal s1 and s2, no murmurs, no carotid bruits, 1+ edema in his legs bilaterally, 2+ dorsalis pulses bilateally RESP: no increased work of breathing, good air movement bilaterally, no crackles or wheeze GI: soft,  non-tender,non-distended, bowel sounds present and normal MSK: no tenderness to palpation over his back, in his legs.  SKIN: maceration and scaling of skin in his feet, strong smell, no sign of cellulitis HEM: no cervical lymphadenopathy NEURO: alert and oriented to city, state, month and year but not date, didn't recognize his room, motor 2/5 in LE's and 4/5 in UE's bilaterally, course tremor/ataxia when he held his arms out.    Assessment & Plan:  Type 2 diabetes mellitus with other circulatory complications (HCC) Blood glucose improved after recent adjustment of his insulin. FBG in the range of 100's to 200's. Prr-lunch blood glucose ranged from 100's to low 300's. Pre-dinner BG ranged from 300's to low 400's. He is now on Lantus 5 units twice a day and Novolog 6 units before breakfast, 5 units before lunch and 4 units before dinner and SSI. Patient is intermittently confused. He occasionally refuses his insulin which makes managing his diabetes a challenge.  He thinks people are taking his blood when they check his CBG. Regardless, I will consider increasing his pre-Lunch Novolog to 6 units with the hope to bring down his pre-dinner BG.  -Reassured that the finger prick is just to monitor his diabetes, not to take blood from him.    Essential hypertension SBP in the range of 120's to 190's. Recent blood pressure 166/46. He is on Norvac 10 mg, Coreg 12.5 mg twice a day and lisinopril 10 mg daily. Patient intermittently refuses his medication which makes it hard to have a good baseline. Regardless it is reasonable to go up on his lisinopril to 20 mg dialy.   Congestive dilated cardiomyopathy ([place])  Stable. Exam remarkable for 1+ pitting edema in lower extremity bilaterally. Weight is up by 4 lbs over the last two months but hard to tell if this is weight gain due to excess fluid or improved by mouth intake. He has been on supplement for weight loss. He has no chest pian or shortness of breath. Lug  exam within normal limit. Last Echo in 03/2014 with EF of 45-50%, G1DD, and without finding suggestive for right heart failure. Weight trends: 08/26/2015: 145 lbs 09/01/2015: 148 lbs (d/t scale) (exam with 1+pitting edema to his knees) per his progress note while inpatient.  09/12/2015: 158 lbs 09/24/2015: 160 lbs 10/27/2015: 162 lbs  Patient is on lisinopril 10 mg dialy, Coreg 12.5 mg twice a day and lasix 60 mg three times a day.  -Will increase his lisinopril to 20 mg daily.  -Will continue the rest of medication as they are.  -Clolsely monitor weight and respiratory symptoms.    Chronic hepatitis C without hepatic coma ([place]) On Zepatier since 09/22/2015. Needs LFT's and Hep C RNA at 8 and 12 weeks.  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  -Continue PT/OT -Continue Plavix and Atorvastatin along with blood pressure medications.  Hypothyroidism Stable. Continue synthroid 100 mcg daily   Anemia of renal disease Hgb slowly trending down. Last Hgb 8.9 on 09/01/2015. Has iron panel three months ago suggestive for anemia of chronic disease -CBC  Depression Stable. No longer on medication.   HLD (hyperlipidemia) On Lipitor 80 mg daily.   BPH (benign prostatic hyperplasia) No urinary symptoms at this time. Continue Flomax  Tinea pedis of both feet Maceration and scaling of skin in his feet, strong smell. Patient diabetic and at risk for cellulitis -Terbinafine cream 1% twice daily for two weeks -Avoid closed shoes for good aeration if possible -Daily cleaning/washing with soap and water -Daily change of clean soaks

## 2015-11-24 DIAGNOSIS — N400 Enlarged prostate without lower urinary tract symptoms: Secondary | ICD-10-CM | POA: Insufficient documentation

## 2015-11-24 DIAGNOSIS — B353 Tinea pedis: Secondary | ICD-10-CM | POA: Insufficient documentation

## 2015-11-24 MED ORDER — INSULIN GLARGINE 100 UNIT/ML ~~LOC~~ SOLN: 5.0000 [IU] | Freq: Two times a day (BID) | SUBCUTANEOUS | 0 refills | Status: DC

## 2015-11-24 MED ORDER — INSULIN LISPRO 100 UNIT/ML ~~LOC~~ SOLN: SUBCUTANEOUS | 11 refills | Status: DC

## 2015-11-24 MED ORDER — LISINOPRIL 20 MG PO TABS: 10.0000 mg | ORAL_TABLET | Freq: Every day | ORAL | 11 refills | Status: DC

## 2015-11-24 MED ORDER — TERBINAFINE HCL 1 % EX CREA: 1.0000 "application " | TOPICAL_CREAM | Freq: Two times a day (BID) | CUTANEOUS | 0 refills | Status: AC

## 2015-11-24 NOTE — Assessment & Plan Note (Addendum)
On Zepatier since 09/22/2015. Needs LFT's and Hep C RNA at 8 and 12 weeks.  -CMP -Hep C RNA quant

## 2015-11-24 NOTE — Assessment & Plan Note (Signed)
No urinary symptoms at this time. Continue Flomax

## 2015-11-24 NOTE — Assessment & Plan Note (Signed)
Stable. No longer on medication.

## 2015-11-24 NOTE — Assessment & Plan Note (Addendum)
Stable.  -Continue PT/OT -Continue Plavix and Atorvastatin along with blood pressure medications.

## 2015-11-24 NOTE — Assessment & Plan Note (Addendum)
Stable. Exam remarkable for 1+ pitting edema in lower extremity bilaterally. Weight is up by 4 lbs over the last two months but hard to tell if this is weight gain due to excess fluid or improved by mouth intake. He has been on supplement for weight loss. He has no chest pian or shortness of breath. Lug exam within normal limit. Last Echo in 03/2014 with EF of 45-50%, G1DD, and without finding suggestive for right heart failure. Weight trends: 08/26/2015: 145 lbs 09/01/2015: 148 lbs (d/t scale) (exam with 1+pitting edema to his knees) per his progress note while inpatient.  09/12/2015: 158 lbs 09/24/2015: 160 lbs 10/27/2015: 162 lbs  Patient is on lisinopril 10 mg dialy, Coreg 12.5 mg twice a day and lasix 60 mg three times a day.  -Will increase his lisinopril to 20 mg daily.  -Will continue the rest of medication as they are.  -Clolsely monitor weight and respiratory symptoms.

## 2015-11-24 NOTE — Assessment & Plan Note (Addendum)
Maceration and scaling of skin in his feet, strong smell. Patient diabetic and at risk for cellulitis -Terbinafine cream 1% twice daily for two weeks -Avoid closed shoes for good aeration if possible -Daily cleaning/washing with soap and water -Daily change of clean soaks

## 2015-11-24 NOTE — Assessment & Plan Note (Signed)
SBP in the range of 120's to 190's. Recent blood pressure 166/46. He is on Norvac 10 mg, Coreg 12.5 mg twice a day and lisinopril 10 mg daily. Patient intermittently refuses his medication which makes it hard to have a good baseline. Regardless it is reasonable to go up on his lisinopril to 20 mg dialy.

## 2015-11-24 NOTE — Assessment & Plan Note (Addendum)
Stable. Continue synthroid 100 mcg daily. 

## 2015-11-24 NOTE — Assessment & Plan Note (Deleted)
Followed by ID (Dr. Linus Salmons). On Zepatier.

## 2015-11-24 NOTE — Assessment & Plan Note (Addendum)
Blood glucose improved after recent adjustment of his insulin. FBG in the range of 100's to 200's. Prr-lunch blood glucose ranged from 100's to low 300's. Pre-dinner BG ranged from 300's to low 400's. He is now on Lantus 5 units twice a day and Novolog 6 units before breakfast, 5 units before lunch and 4 units before dinner and SSI. Patient is intermittently confused. He occasionally refuses his insulin which makes managing his diabetes a challenge.  He thinks people are taking his blood when they check his CBG. Regardless, I will consider increasing his pre-Lunch Novolog to 6 units with the hope to bring down his pre-dinner BG.  -Reassured that the finger prick is just to monitor his diabetes, not to take blood from him.

## 2015-11-24 NOTE — Assessment & Plan Note (Addendum)
Hgb slowly trending down. Last Hgb 8.9 on 09/01/2015. Has iron panel three months ago suggestive for anemia of chronic disease -CBC

## 2015-11-24 NOTE — Assessment & Plan Note (Addendum)
On Lipitor 80 mg daily. 

## 2015-11-28 ENCOUNTER — Encounter: Payer: Self-pay | Admitting: Student

## 2015-11-28 DIAGNOSIS — E213 Hyperparathyroidism, unspecified: Secondary | ICD-10-CM | POA: Insufficient documentation

## 2015-11-30 ENCOUNTER — Encounter: Payer: Self-pay | Admitting: Student

## 2015-11-30 ENCOUNTER — Ambulatory Visit (HOSPITAL_COMMUNITY): Payer: Medicare Other

## 2015-11-30 NOTE — Progress Notes (Signed)
I precepted the patient with Dr Cyndia Skeeters.  I agree with their plan as documented in this regulatory visit note.

## 2015-12-01 ENCOUNTER — Other Ambulatory Visit (HOSPITAL_COMMUNITY): Payer: Self-pay | Admitting: *Deleted

## 2015-12-04 ENCOUNTER — Ambulatory Visit (HOSPITAL_COMMUNITY)
Admission: RE | Admit: 2015-12-04 | Discharge: 2015-12-04 | Disposition: A | Discharge status: Home / Self Care | Payer: Medicare Other | Source: Ambulatory Visit | Attending: Nephrology | Admitting: Nephrology

## 2015-12-04 DIAGNOSIS — J90 Pleural effusion, not elsewhere classified: Secondary | ICD-10-CM | POA: Insufficient documentation

## 2015-12-04 DIAGNOSIS — R93421 Abnormal radiologic findings on diagnostic imaging of right kidney: Secondary | ICD-10-CM | POA: Diagnosis not present

## 2015-12-04 DIAGNOSIS — R938 Abnormal findings on diagnostic imaging of other specified body structures: Secondary | ICD-10-CM | POA: Insufficient documentation

## 2015-12-04 DIAGNOSIS — R93422 Abnormal radiologic findings on diagnostic imaging of left kidney: Secondary | ICD-10-CM | POA: Diagnosis not present

## 2015-12-04 DIAGNOSIS — N184 Chronic kidney disease, stage 4 (severe): Secondary | ICD-10-CM | POA: Diagnosis not present

## 2015-12-04 DIAGNOSIS — B182 Chronic viral hepatitis C: Secondary | ICD-10-CM | POA: Insufficient documentation

## 2015-12-04 DIAGNOSIS — D509 Iron deficiency anemia, unspecified: Secondary | ICD-10-CM | POA: Insufficient documentation

## 2015-12-04 MED ORDER — SODIUM CHLORIDE 0.9 % IV SOLN
510.0000 mg | Freq: Once | INTRAVENOUS | Status: AC
  Administered 2015-12-04: 510 mg via INTRAVENOUS
  Filled 2015-12-04: qty 17

## 2015-12-05 ENCOUNTER — Ambulatory Visit (HOSPITAL_COMMUNITY)
Admission: RE | Admit: 2015-12-05 | Discharge: 2015-12-05 | Disposition: A | Discharge status: Home / Self Care | Payer: Medicare Other | Source: Ambulatory Visit | Attending: Internal Medicine | Admitting: Internal Medicine

## 2015-12-05 DIAGNOSIS — B182 Chronic viral hepatitis C: Secondary | ICD-10-CM | POA: Diagnosis not present

## 2015-12-11 ENCOUNTER — Non-Acute Institutional Stay (INDEPENDENT_AMBULATORY_CARE_PROVIDER_SITE_OTHER): Payer: Medicare Other | Admitting: Obstetrics and Gynecology

## 2015-12-11 DIAGNOSIS — H9202 Otalgia, left ear: Secondary | ICD-10-CM

## 2015-12-11 NOTE — Progress Notes (Signed)
[facility] Visit  [name] is alone Sources of clinical information for visit is/are patient and nursing home.  HISTORY OF PRESENT ILLNESS: [name] is a 70 y.o.  male.    No chief complaint on file.   HPI: Patient states that he is having ear pain on the left. Pain started on 9/15. He denies any trauma or injury to ear. No ear drainage or discharge. No associated fevers. Has not tried anything for pain. No cold or food intolerance. Has had similar pain in the past on the right side that was related to his teeth. Believes this is the cause of his pain now. Has not seen a dentist in a while.  History Patient Active Problem List   Diagnosis Date Noted  . Hyperparathyroidism ([place]) 11/28/2015  . BPH (benign prostatic hyperplasia) 11/24/2015  . Tinea pedis of both feet 11/24/2015  . Liver cirrhosis ([place]) 09/20/2015  . Question of Cognitive impairment 09/11/2015  . Nephrotic syndrome 09/11/2015  . Anemia of renal disease 09/11/2015  . Left hemiparesis ([place]) 09/11/2015  . Not immune to hepatitis B virus 06/27/2015  . Hypothyroidism 06/27/2015  . Glaucoma 02/01/2015  . Chronic hepatitis C without hepatic coma ([place]) 01/04/2015  . Person living in residential institution 12/28/2014  . Depression 12/28/2014  . HLD (hyperlipidemia) 06/16/2014  . Peripheral vision loss 06/16/2014  . 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 Chronic kidney disease (CKD), stage IV (severe) ([place]) 05/27/2014  . Essential hypertension   . Type 2 diabetes mellitus with other circulatory complications ([place])   . Congestive dilated cardiomyopathy ([place]) 04/11/2014  . History of cerebrovascular accident with residual effects      Medications  Current Outpatient Prescriptions:  .  amLODipine (NORVASC) 10 MG tablet, Take 1 tablet (10 mg total) by mouth daily., Disp: , Rfl:  .  atorvastatin (LIPITOR) 80 MG tablet, Take 1 tablet (80 mg total) by  mouth daily at 6 PM., Disp: , Rfl:  .  bimatoprost (LUMIGAN) 0.01 % SOLN, Place 1 drop into the right eye at bedtime., Disp: , Rfl:  .  carvedilol (COREG) 12.5 MG tablet, Take 1 tablet (12.5 mg total) by mouth 2 (two) times daily with a meal., Disp: , Rfl:  .  clopidogrel (PLAVIX) 75 MG tablet, Take 1 tablet (75 mg total) by mouth daily., Disp: , Rfl:  .  Elbasvir-Grazoprevir (ZEPATIER PO), Take 1 tablet by mouth daily., Disp: , Rfl:  .  furosemide (LASIX) 40 MG tablet, Take 1.5 tablets (60 mg total) by mouth 2 (two) times daily., Disp: 30 tablet, Rfl:  .  gabapentin (NEURONTIN) 100 MG capsule, Take 100 mg by mouth 3 (three) times daily. , Disp: , Rfl:  .  glucagon (GLUCAGON EMERGENCY) 1 MG injection, Inject 1 mg into the vein once as needed (hypoglycemia). , Disp: , Rfl:  .  insulin glargine (LANTUS) 100 UNIT/ML injection, Inject 0.05 mLs (5 Units total) into the skin 2 (two) times daily., Disp: 10 mL, Rfl: 0 .  insulin lispro (HUMALOG) 100 UNIT/ML injection, Inject 0-9 Units into the skin See admin instructions. Only sliding scale. 0-9 units, Disp: , Rfl:  .  insulin lispro (HUMALOG) 100 UNIT/ML injection, 6 units before breakfast, 6 units before lunch and 4 units before dinner, Disp: 10 mL, Rfl: 11 .  ketorolac (ACULAR) 0.4 % SOLN, Place 1 drop into the left eye 4 (four) times daily., Disp: , Rfl:  .  levothyroxine (SYNTHROID, LEVOTHROID) 100 MCG  tablet, Take 100 mcg by mouth daily before breakfast., Disp: , Rfl:  .  lisinopril (PRINIVIL,ZESTRIL) 20 MG tablet, Take 0.5 tablets (10 mg total) by mouth daily., Disp: 30 tablet, Rfl: 11 .  NON FORMULARY, Take 120 mLs by mouth 3 (three) times daily. , Disp: , Rfl:  .  OVER THE COUNTER MEDICATION, Take 1 scoop by mouth 2 (two) times daily. Protein powder mixed with food/drink (25 g of protein per serving) PURE PROTEIN, Disp: , Rfl:  .  polyethylene glycol (MIRALAX / GLYCOLAX) packet, Take 17 g by mouth 2 (two) times daily., Disp: , Rfl:  .  prednisoLONE  acetate (PRED FORTE) 1 % ophthalmic suspension, Place 1 drop into the left eye 4 (four) times daily., Disp: , Rfl:  .  PRESCRIPTION MEDICATION, Take 1 each by mouth 2 (two) times daily. Magic cup. Twice daily, Disp: , Rfl:  .  sennosides-docusate sodium (SENOKOT-S) 8.6-50 MG tablet, Take 2 tablets by mouth 2 (two) times daily. , Disp: , Rfl:  .  tamsulosin (FLOMAX) 0.4 MG CAPS capsule, Take 0.8 mg by mouth at bedtime. , Disp: , Rfl:  No current facility-administered medications for this visit.   Facility-Administered Medications Ordered in Other Visits:  .  phenylephrine (MYDFRIN) 2.5 % ophthalmic solution 1 drop, 1 drop, Left Eye, Once, [name], MD   There were no vitals filed for this visit.  Wt Readings from Last 3 Encounters:  12/04/15 158 lb (71.7 kg)  09/20/15 158 lb (71.7 kg)  09/07/15 144 lb 9.6 oz (65.6 kg)   Review of Systems:  See HPI  PHYSICAL EXAM:. General: No acute distress, well nourished, pleasant HEENT:  No scleral icterus, no nasal secretions, [place] moist and no erythema or lesion. Bilateral TMs visualized and normal. Ear canals and external ears normal. No tenderness to palpation of ears. Minimal cerumen.  Neck:  Supple, No JVD, no lymphadenopathy   Assessment(s):  Acute Otalgia of left ear.  Left-sided ear pain. Most likely referred pain from poor dentition. Has not followed with a dentist in a while. Patient has had similar pain in the past on the right and was improved after dentist fixed his teeth. No signs of dental or ear infection. Ear exam benign.   Plan(s): 1.  Dental referral 2.  PRN Tyelnol for pain   Code Status:     Code Status History    Date Active Date Inactive Code Status Order ID Comments User Context   09/01/2015 12:47 AM 09/01/2015 10:56 PM Full Code [id number]  Asiyah Cletis Media, MD Inpatient   02/06/2015  3:53 AM 02/07/2015  8:37 PM Full Code [id number]  Sela Hua, MD Inpatient   09/16/2014  4:49 PM 09/17/2014  5:49 PM Full Code  [id number]  Oswald Hillock, MD Inpatient   04/09/2014  7:59 PM 04/18/2014 10:17 PM Full Code [id number]  Ripudeep Krystal Eaton, MD Inpatient        There are no discontinued medications.     Luiz Blare, DO 12/11/2015, 11:27 AM PGY-3, [place]

## 2015-12-13 NOTE — Progress Notes (Signed)
I discussed this patient's case with Dr. Gerarda Fraction.  I agree with their plan as documented in their note for today.

## 2015-12-18 ENCOUNTER — Encounter: Payer: Self-pay | Admitting: Pharmacist

## 2015-12-20 ENCOUNTER — Encounter: Payer: Self-pay | Admitting: Family Medicine

## 2015-12-23 ENCOUNTER — Telehealth: Payer: Self-pay | Admitting: Internal Medicine

## 2015-12-23 NOTE — Telephone Encounter (Signed)
After Hours/ Emergency Line Call  Received a call to report that Brenin Arriaga has left heel pain from [facility] nursing   - reports Left leg pain x 2 days ago, on the left heel - nurse reports there is an area of redness at his heel started 2 days ago - no fevers - reports boots were prescribed to relieve pressure on the heels: this was three weeks 3 weeks ago - vitals: 162/102, HR 99, afebrile. Patient just received BP meds not too long ago - requests pain medication as Tylenol 650mg  is not relieving symptoms   Went to evaluate patient. Reports his pain is only at his heal. He denies fevers/chills.  On exam: there is some dry cracked skin on his Left heel. There is also a 2cm by 1 cm very superficial (possibly stage II ulcer) that does not look infected; this is not where his pain is with palpation. Foot is not have increased warmth the area. No swelling noted. TTP at the calcaneal and achilles tendon.  Patient had his foot in a soft boot.   I do not think the ulcer is infected. The TTP is over his calcaneal and achilles tendon. Possible this is plantar fasciitis. Tylenol is not relieving his symptoms. He had CKD therefore will not prescribe NSAIDs. Will prescribe Oxy IR 5mg  q 12 hours PRN for heel pain for now (q 12 PRN due to poor renal function) and can continue Tylenol as well. Close monitoring for any signs or symptoms of infection.   Will forward to Geriatric Resident to follow up on Monday and also Dr. McDiarmid.    Smiley Houseman, MD PGY-2, [facility]

## 2015-12-29 ENCOUNTER — Other Ambulatory Visit: Payer: Self-pay | Admitting: Family Medicine

## 2015-12-29 DIAGNOSIS — M79673 Pain in unspecified foot: Secondary | ICD-10-CM

## 2015-12-29 MED ORDER — OXYCODONE HCL 5 MG PO TABS: 5.0000 mg | ORAL_TABLET | Freq: Two times a day (BID) | ORAL | 0 refills | Status: AC | PRN

## 2015-12-29 MED ORDER — OXYCODONE HCL 5 MG PO TABS: 5.0000 mg | ORAL_TABLET | Freq: Two times a day (BID) | ORAL | Status: AC | PRN

## 2016-01-02 ENCOUNTER — Other Ambulatory Visit: Payer: Self-pay | Admitting: Family Medicine

## 2016-01-02 NOTE — Progress Notes (Signed)
Novolog changed to Humalog [place]igg to accommodate [employer]

## 2016-01-05 ENCOUNTER — Other Ambulatory Visit: Payer: Self-pay | Admitting: Family Medicine

## 2016-01-05 MED ORDER — GLECAPREVIR-PIBRENTASVIR 100-40 MG PO TABS: 3.0000 | ORAL_TABLET | Freq: Every day | ORAL | Status: DC

## 2016-01-10 ENCOUNTER — Other Ambulatory Visit: Payer: Self-pay | Admitting: Family Medicine

## 2016-01-10 MED ORDER — ATORVASTATIN CALCIUM 40 MG PO TABS: 40.0000 mg | ORAL_TABLET | Freq: Every day | ORAL | 3 refills | Status: DC

## 2016-01-11 LAB — CBC
HEMATOCRIT: 25 % — AB (ref 39–50)
HEMOGLOBIN: 8.1 — AB (ref 13.2–17.1)
MCH: 29.7 (ref 27–33)
MCHC: 32.7 (ref 32–36)
MCV: 90.8 (ref 80–100)
MPV: 9.9 fL (ref 7.5–11.5)
Platelets: 179 10*3/uL (ref 140–400)
RBC: 2.73 — AB (ref 4.2–5.8)
RDW: 15.8 — AB (ref 11–15)
WBC: 5.1 (ref 3.8–10.8)

## 2016-01-11 LAB — BASIC METABOLIC PANEL
Creat: 3.49 — AB (ref 0.70–1.25)
SODIUM: 142 mmol/L (ref 137–147)

## 2016-01-11 LAB — HEPATIC FUNCTION PANEL
ALK PHOS: 71 U/L (ref 40–115)
ALT: 8 — AB (ref 9–46)
AST: 12 U/L (ref 10–35)
Albumin: 2.6 — AB (ref 3.6–5.1)
BILIRUBIN TOTAL: 0.2 mg/dL (ref 0.2–1.2)
BILIRUBIN, DIRECT: 0.1 (ref ?–0.2)
BILIRUBIN, INDIRECT: 0.1 — AB (ref 0.2–1.2)
Total Protein: 5.6 — AB (ref 6.1–8.1)

## 2016-01-11 NOTE — Progress Notes (Signed)
CBC/hepatic function panel/sodium/creatinine from [facility] on 01/04/2016 manually abstracted into patient's chart.

## 2016-01-17 ENCOUNTER — Encounter: Payer: Self-pay | Admitting: Pharmacist

## 2016-01-22 ENCOUNTER — Non-Acute Institutional Stay (INDEPENDENT_AMBULATORY_CARE_PROVIDER_SITE_OTHER): Payer: Medicare Other | Admitting: Student

## 2016-01-22 DIAGNOSIS — N184 Chronic kidney disease, stage 4 (severe): Secondary | ICD-10-CM

## 2016-01-22 DIAGNOSIS — N189 Chronic kidney disease, unspecified: Secondary | ICD-10-CM

## 2016-01-22 DIAGNOSIS — I63031 Cerebral infarction due to thrombosis of right carotid artery: Secondary | ICD-10-CM

## 2016-01-22 DIAGNOSIS — I10 Essential (primary) hypertension: Secondary | ICD-10-CM

## 2016-01-22 DIAGNOSIS — D631 Anemia in chronic kidney disease: Secondary | ICD-10-CM

## 2016-01-22 DIAGNOSIS — E1159 Type 2 diabetes mellitus with other circulatory complications: Secondary | ICD-10-CM

## 2016-01-22 DIAGNOSIS — I420 Dilated cardiomyopathy: Secondary | ICD-10-CM

## 2016-01-22 DIAGNOSIS — Z794 Long term (current) use of insulin: Secondary | ICD-10-CM

## 2016-01-22 DIAGNOSIS — B182 Chronic viral hepatitis C: Secondary | ICD-10-CM

## 2016-01-28 NOTE — Assessment & Plan Note (Signed)
Completed course of Zepatier (10/03/2015 to 12/28/2015). Undetectable HCV RNA on 11/30/2015 suggesting good end of treatment response. No evidence of cirrhosis by elastography of his liver. [place] without ascites.  -Repeat labs and follow up in 02/2016 in 12 weeks (December 2017). Followed by Dr. Rivka Barbara Zamor [place]).

## 2016-01-28 NOTE — Progress Notes (Signed)
Subjective:    [name] is a 70 y.o. old male.  HPI  [name] to see patient after reviewing his nursing home chart. Patient was in dining room enjoying his breakfast. I did not want to interrupt his breakfast. So I greeted patient and told him that I would be back to see him the next day.  The following day, patient was sleeping in his room. We rose him with the help of his nurse easily. After introducing myself, I briefly discussed the purpose of my visit. Patient was able to recognize me but not fully. He is oriented to self, person, year and country.   He denies any concerns today. He denies chest pain, shortness of breath or pain elsewhere. He is happy about the care he is receiving from the nursing home staff.   PMH: reviewed and updated SH: wife lives in [place] and works as Marine scientist. He has two sons who lives in [place].  Review of Systems  Constitutional: Negative for appetite change, fever and unexpected weight change.  HENT: Negative for facial swelling, hearing loss, trouble swallowing and voice change.   Eyes: Positive for visual disturbance.  Respiratory: Negative for cough, chest tightness and shortness of breath.   Cardiovascular: Positive for leg swelling. Negative for chest pain and palpitations.  Gastrointestinal: Negative for abdominal pain, constipation, diarrhea, nausea and vomiting.  Endocrine: Negative for cold intolerance, heat intolerance and polyuria.  Genitourinary: Negative for dysuria and urgency.  Musculoskeletal: Negative for arthralgias, back pain and myalgias.  Skin: Negative for rash and wound.  Neurological: Negative for light-headedness and headaches.  Psychiatric/Behavioral: The patient is not nervous/anxious.    Per HPI Objective:  Weight trends: 09/12/2015: 158 lbs 09/24/2015: 160 lbs 10/27/2015: 162 lbs 12/28/2015: 161.8 lbs  GEN: wheelchair bound, intermittently confused but pleasant Head: normocephalic and atraumatic,  Eyes:  without conjunctival injection, sclera anicteric, vision grossly intact, left pupil appears glassy Ears: responds to sound stimuli appropriately, no apparent lesion  Oropharynx: mmm without erythema or exudation. Poor dentition CVS: RRR, normal s1 and s2, no murmurs, 2+ edema in his right leg, 1+ in his left, faint dorsalis pulses bilateally RESP: no increased work of breathing, good air movement bilaterally, no crackles or wheeze GI: soft, non-tender,non-distended, bowel sounds present and normal MSK: no tenderness to palpation over his back, some contractures in his legs, some atrophy in his left leg SKIN: scaling of skin in his feet, no maceration, no sign of cellulitis HEM: no cervical lymphadenopathy NEURO: easily arises to sound stimuli, oriented to self, person, year and country. Didn't recognize his room. Cranial nerves grossly intact. Motor 2/5 in LE's and 4/5 in UE's bilaterally, course tremor/ataxia in upper extremities bilaterally    Assessment & Plan:  Congestive dilated cardiomyopathy (HCC) Stable. Exam remarkable for 2+ edema in his right leg, 1+ in his left leg.  Weight stable over the last 2 months. Cardiopulmonary history and exam reassuring.  -Continue Coreg 12.5 mg twice a day, lasix 60 mg three times a day and lisinopril to 20 mg daily.  -Clolsely monitor weight and respiratory symptoms.  -BMP today.   Essential hypertension Fairly controlled. SBP ranges from 134-189. The DBP ranges from 76-88. Most recent blood pressure 143/81.  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 Patient with some degree of contracture in his lower extremities and coarse tremor in his upper extremities. He also appears to have muscle wasting in the left extremity -  Continue PT/OT -Continue Plavix and Atorvastatin along with blood pressure medications.  Chronic hepatitis C without hepatic coma ([place]) Completed course of  Zepatier (10/03/2015 to 12/28/2015). Undetectable HCV RNA on 11/30/2015 suggesting good end of treatment response. No evidence of cirrhosis by elastography of his liver. [place] without ascites.  -Repeat labs and follow up in 02/2016 in 12 weeks (December 2017). Followed by Dr. Rivka Barbara Zamor [place]).   Diabetes ([place]) Fairly controlled. A1c is stable at 8.3. A FBG ranges from 81-229, most recent values are 193, 129, 123 & 161. Pre-lunch CBG ranges from 93-273, most recent values: 174, 153, 299 & 239. Pre-dinner CBG ranges from 85-331. Most recent values are 119, 169, 289 & 220.  -Increase his Lantus to 6 units twice a day.   Chronic kidney disease (CKD), stage IV (severe) ([place]) Patient does not want dialysis ever per note from [facility] by Jamal Maes on 11/23/2015. Recommended discontinuing the lisinopril in hope of getting back few GFR points since patient doesn't desire hemodialysis. However, I feel his high blood pressure could cause more damage to his kidney and then the ace inhibitors. Patient also have HFmrEF and CVA and could benefit from Ace inhibitors. His creatinine remained stable at 3.5 after increasing his lisinopril to 20 mg for his elevated SBP. -BMP today. If hyperkalemia, we can either cut down or stop Lisinopril altogether.   Anemia of renal disease Most recent hemoglobin 8.1. Aransep per nephrology. -CBC today

## 2016-01-28 NOTE — Assessment & Plan Note (Addendum)
Most recent hemoglobin 8.1. Aransep per nephrology. -CBC today

## 2016-01-28 NOTE — Assessment & Plan Note (Signed)
Patient does not want dialysis ever per note from [facility] by Jamal Maes on 11/23/2015. Recommended discontinuing the lisinopril in hope of getting back few GFR points since patient doesn't desire hemodialysis. However, I feel his high blood pressure could cause more damage to his kidney and then the ace inhibitors. Patient also have HFmrEF and CVA and could benefit from Ace inhibitors. His creatinine remained stable at 3.5 after increasing his lisinopril to 20 mg for his elevated SBP. -BMP today. If hyperkalemia, we can either cut down or stop Lisinopril altogether.

## 2016-01-28 NOTE — Assessment & Plan Note (Addendum)
Stable. Exam remarkable for 2+ edema in his right leg, 1+ in his left leg.  Weight stable over the last 2 months. Cardiopulmonary history and exam reassuring.  -Continue Coreg 12.5 mg twice a day, lasix 60 mg three times a day and lisinopril to 20 mg daily.  -Clolsely monitor weight and respiratory symptoms.  -BMP today.

## 2016-01-28 NOTE — Assessment & Plan Note (Addendum)
Fairly controlled. A1c is stable at 8.3. A FBG ranges from 81-229, most recent values are 193, 129, 123 & 161. Pre-lunch CBG ranges from 93-273, most recent values: 174, 153, 299 & 239. Pre-dinner CBG ranges from 85-331. Most recent values are 119, 169, 289 & 220.  -Increase his Lantus to 6 units twice a day.

## 2016-01-28 NOTE — Assessment & Plan Note (Signed)
Stable. Patient with some degree of contracture in his lower extremities and coarse tremor in his upper extremities. He also appears to have muscle wasting in the left extremity -Continue PT/OT -Continue Plavix and Atorvastatin along with blood pressure medications.

## 2016-01-28 NOTE — Assessment & Plan Note (Signed)
Fairly controlled. SBP ranges from 134-189. The DBP ranges from 76-88. Most recent blood pressure 143/81.  -Continued above medications -BMP today

## 2016-01-29 ENCOUNTER — Telehealth: Payer: Self-pay | Admitting: Student

## 2016-01-29 MED ORDER — INSULIN GLARGINE 100 UNIT/ML ~~LOC~~ SOLN: 6.0000 [IU] | Freq: Two times a day (BID) | SUBCUTANEOUS | 0 refills | Status: DC

## 2016-01-29 NOTE — Telephone Encounter (Signed)
Attempted to call the patient's wife for updates multiple times without success

## 2016-02-03 ENCOUNTER — Encounter: Payer: Self-pay | Admitting: Student

## 2016-02-03 LAB — COMPREHENSIVE METABOLIC PANEL
ALBUMIN: 2.5 g/dL — AB (ref 3.6–5.1)
ALK PHOS: 74 U/L (ref 40–115)
ALT: 9 U/L (ref 9–46)
AST: 11 U/L (ref 10–35)
BILIRUBIN TOTAL: 0.2 mg/dL (ref 0.2–1.2)
BUN, Bld: 70 mg/dL — AB (ref 7–25)
CALCIUM: 7.8 mg/dL — AB (ref 8.6–10.3)
CO2: 21 mmol/L (ref 20–31)
Chloride: 106 mmol/L (ref 98–110)
Creatinine, Ser: 3.47 mg/dL — AB (ref 0.70–1.25)
GLUCOSE: 129 mg/dL — AB (ref 65–99)
HCV RNA (IU/mL): NOT DETECTED (ref <15)
Potassium: 4.2 mmol/L (ref 3.5–5.3)
SODIUM: 140 mmol/L (ref 135–146)
TOTAL PROTEIN: 5.4 g/dL — AB (ref 6.1–8.1)

## 2016-02-03 LAB — BASIC METABOLIC PANEL
BUN, Bld: 49 mg/dL — AB (ref 7–25)
CALCIUM: 8.2 mg/dL — AB (ref 8.6–10.3)
CO2: 23 mmol/L (ref 20–31)
Chloride: 114 mmol/L — AB (ref 98–110)
Creat: 3.57 mg/dL — AB (ref 0.7–1.25)
Glucose: 85 mg/dL (ref 65–99)
PHOSPHORUS: 4.4 mg/dL — AB (ref 2.1–4.3)
Potassium: 4.4 mmol/L (ref 3.5–5.3)
SODIUM: 144 mmol/L (ref 135–146)

## 2016-02-03 LAB — CBC
HCT: 28 % — AB (ref 39–50)
Hemoglobin: 9 g/dL — AB (ref 13.2–17.1)
MCH: 28.9 pg (ref 27.0–33)
MCHC: 32.4 g/dL (ref 32–36)
MCV: 89.4 fL (ref 80.0–100.0)
MPV: 9.4 fL (ref 7.5–11.5)
PLATELETS: 176 10*3/uL (ref 140–400)
RBC: 3.11 MIL/uL — AB (ref 4.20–5.8)
RDW: 16 % — AB (ref 11–15)
WBC: 5.6 10*3/uL (ref 3.8–10.8)

## 2016-02-07 ENCOUNTER — Other Ambulatory Visit: Payer: Self-pay | Admitting: Family Medicine

## 2016-02-07 MED ORDER — CALCIUM CITRATE 950 (200 CA) MG PO TABS: 200.0000 mg | ORAL_TABLET | Freq: Every day | ORAL | Status: DC

## 2016-02-19 ENCOUNTER — Other Ambulatory Visit: Payer: Self-pay | Admitting: Family Medicine

## 2016-02-19 DIAGNOSIS — E1159 Type 2 diabetes mellitus with other circulatory complications: Secondary | ICD-10-CM

## 2016-02-19 DIAGNOSIS — Z794 Long term (current) use of insulin: Principal | ICD-10-CM

## 2016-02-19 MED ORDER — INSULIN LISPRO 100 UNIT/ML (KWIKPEN): 4.0000 [IU] | PEN_INJECTOR | Freq: Three times a day (TID) | SUBCUTANEOUS | 11 refills | Status: DC

## 2016-02-19 MED ORDER — INSULIN LISPRO 100 UNIT/ML (KWIKPEN): PEN_INJECTOR | SUBCUTANEOUS | 11 refills | Status: DC

## 2016-02-19 NOTE — Progress Notes (Signed)
Humalog Kwikpen rx Stop novolog Change in payor formulary

## 2016-02-22 ENCOUNTER — Ambulatory Visit (INDEPENDENT_AMBULATORY_CARE_PROVIDER_SITE_OTHER): Payer: Medicare Other | Admitting: Internal Medicine

## 2016-02-22 ENCOUNTER — Ambulatory Visit: Payer: Medicare Other | Admitting: Internal Medicine

## 2016-02-22 DIAGNOSIS — N184 Chronic kidney disease, stage 4 (severe): Secondary | ICD-10-CM | POA: Diagnosis not present

## 2016-02-22 DIAGNOSIS — B182 Chronic viral hepatitis C: Secondary | ICD-10-CM

## 2016-02-22 NOTE — Progress Notes (Signed)
   Subjective:    [name], male    DOB: May 15, 1945, 70 y.o.   MRN: [id number]  HPI Here for follow up of hepatitis C.  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  Has stage IV kidney disease as well and plan was to start Zepatier but apparently did not get approved.  No new issues.     Review of Systems  Constitutional: Negative for fatigue.  Gastrointestinal: Negative for diarrhea.  Neurological: Negative for dizziness.       Objective:   Physical Exam  Constitutional: He appears well-developed and well-nourished.  Eyes: No scleral icterus.  Cardiovascular: Normal rate, regular rhythm and normal heart sounds.   Skin: No rash noted.          Assessment & Plan:

## 2016-02-22 NOTE — Assessment & Plan Note (Signed)
Overview noted.  It appears he was already treated and I am unsure of why he is here as well.  I will discuss with his pcp.

## 2016-02-22 NOTE — Assessment & Plan Note (Signed)
Would need zepatier.

## 2016-03-28 ENCOUNTER — Other Ambulatory Visit (HOSPITAL_COMMUNITY): Payer: Self-pay | Admitting: *Deleted

## 2016-03-29 ENCOUNTER — Encounter (HOSPITAL_COMMUNITY)
Admission: RE | Admit: 2016-03-29 | Discharge: 2016-03-29 | Disposition: A | Discharge status: Home / Self Care | Payer: Medicare Other | Source: Ambulatory Visit | Attending: Nephrology | Admitting: Nephrology

## 2016-03-29 DIAGNOSIS — D509 Iron deficiency anemia, unspecified: Secondary | ICD-10-CM | POA: Insufficient documentation

## 2016-03-29 MED ORDER — SODIUM CHLORIDE 0.9 % IV SOLN
510.0000 mg | Freq: Once | INTRAVENOUS | Status: AC
  Administered 2016-03-29: 510 mg via INTRAVENOUS
  Filled 2016-03-29: qty 17

## 2016-04-24 LAB — HM DIABETES EYE EXAM

## 2016-05-03 ENCOUNTER — Ambulatory Visit (HOSPITAL_COMMUNITY): Payer: Medicare Other

## 2016-05-06 ENCOUNTER — Other Ambulatory Visit (HOSPITAL_COMMUNITY): Payer: Self-pay | Admitting: Emergency Medicine

## 2016-05-06 ENCOUNTER — Ambulatory Visit (HOSPITAL_COMMUNITY)
Admission: RE | Admit: 2016-05-06 | Discharge: 2016-05-06 | Disposition: A | Discharge status: Home / Self Care | Payer: Medicare Other | Source: Ambulatory Visit | Attending: Emergency Medicine | Admitting: Emergency Medicine

## 2016-05-06 DIAGNOSIS — H348192 Central retinal vein occlusion, unspecified eye, stable: Secondary | ICD-10-CM | POA: Diagnosis not present

## 2016-05-06 DIAGNOSIS — I6523 Occlusion and stenosis of bilateral carotid arteries: Secondary | ICD-10-CM | POA: Diagnosis not present

## 2016-05-06 NOTE — Progress Notes (Signed)
VASCULAR LAB PRELIMINARY  PRELIMINARY  PRELIMINARY  PRELIMINARY  Carotid duplex completed.    Preliminary report:  1-39% ICA plaquing. Vertebral artery flow is antegrade.   Kamonte Mcmichen, RVT 05/06/2016, 4:29 PM

## 2016-05-07 LAB — VAS US CAROTID
LEFT ECA DIAS: -5 cm/s
LEFT VERTEBRAL DIAS: -6 cm/s
LICADDIAS: -31 cm/s
LICAPSYS: -129 cm/s
Left CCA dist dias: -22 cm/s
Left CCA dist sys: -147 cm/s
Left CCA prox dias: 14 cm/s
Left CCA prox sys: 134 cm/s
Left ICA dist sys: -107 cm/s
Left ICA prox dias: -33 cm/s
RCCAPDIAS: 15 cm/s
RIGHT ECA DIAS: -6 cm/s
RIGHT VERTEBRAL DIAS: -9 cm/s
Right CCA prox sys: 86 cm/s
Right cca dist sys: -102 cm/s

## 2016-05-07 NOTE — Telephone Encounter (Signed)
l °

## 2016-05-08 ENCOUNTER — Other Ambulatory Visit: Payer: Self-pay | Admitting: Family Medicine

## 2016-05-13 ENCOUNTER — Encounter: Payer: Self-pay | Admitting: Gastroenterology

## 2016-05-22 ENCOUNTER — Non-Acute Institutional Stay: Payer: Medicare Other | Admitting: Family Medicine

## 2016-05-22 ENCOUNTER — Telehealth: Payer: Self-pay | Admitting: Gastroenterology

## 2016-05-22 ENCOUNTER — Ambulatory Visit: Payer: Medicare Other | Admitting: Gastroenterology

## 2016-05-22 DIAGNOSIS — M545 Low back pain, unspecified: Secondary | ICD-10-CM

## 2016-05-22 DIAGNOSIS — A084 Viral intestinal infection, unspecified: Secondary | ICD-10-CM | POA: Diagnosis not present

## 2016-05-22 NOTE — Telephone Encounter (Signed)
Noted  

## 2016-05-24 ENCOUNTER — Encounter: Payer: Self-pay | Admitting: Pharmacist

## 2016-05-27 ENCOUNTER — Encounter: Payer: Self-pay | Admitting: Family Medicine

## 2016-05-27 NOTE — Progress Notes (Signed)
[facility] Acute Visit  [name] is accompanied by patient and wife Sources of clinical information for visit is/are patient and spouse/SO.  HISTORY OF PRESENT ILLNESS: Nail Seeton is a 71 y.o.  male.    No chief complaint on file.   HPI:   [name] to evaluate Mr. Slover following reports of one episode of diarrhea and vomiting this am. Denies fever. Also reports back pain. Pain located over lumbar spine. Reports he heard a cracking sound during a transfer the previous weekend--he thought it would improve on its own, but it has remained constant. Requests pain medication--oxycodone noted in chart was discontinued previously since Mr. Kush did not use prior to injury. Would prefer initial imaging be done at [facility] since he doesn't feel well, but if normal agrees to go to [facility] for further imaging.    History Patient Active Problem List   Diagnosis Date Noted  . Hyperparathyroidism ([place]) 11/28/2015  . BPH (benign prostatic hyperplasia) 11/24/2015  . Tinea pedis of both feet 11/24/2015  . Question of Cognitive impairment 09/11/2015  . Nephrotic syndrome 09/11/2015  . Anemia of renal disease 09/11/2015  . Not immune to hepatitis B virus 06/27/2015  . Hypothyroidism 06/27/2015  . Glaucoma 02/01/2015  . Chronic hepatitis C without hepatic coma ([place]) 01/04/2015  . Person living in residential institution 12/28/2014  . Depression 12/28/2014  . HLD (hyperlipidemia) 06/16/2014  . Peripheral vision loss 06/16/2014  . 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 Chronic kidney disease (CKD), stage IV (severe) ([place]) 05/27/2014  . Essential hypertension   . Diabetes ([place])   . Congestive dilated cardiomyopathy ([place]) 04/11/2014     Medications  Current Outpatient Prescriptions:  .  acetaminophen (TYLENOL) 325 MG tablet, Take 650 mg by mouth every 6 (six) hours as needed., Disp: , Rfl:  .  atorvastatin (LIPITOR) 40  MG tablet, Take 1 tablet (40 mg total) by mouth daily. (Patient taking differently: Take 80 mg by mouth daily. ), Disp: 90 tablet, Rfl: 3 .  bimatoprost (LUMIGAN) 0.01 % SOLN, Place 1 drop into the right eye at bedtime., Disp: , Rfl:  .  calcium citrate (CALCITRATE - DOSED IN MG ELEMENTAL CALCIUM) 950 MG tablet, Take 1 tablet (200 mg of elemental calcium total) by mouth daily., Disp: , Rfl:  .  carvedilol (COREG) 12.5 MG tablet, Take 1 tablet (12.5 mg total) by mouth 2 (two) times daily with a meal., Disp: , Rfl:  .  clopidogrel (PLAVIX) 75 MG tablet, Take 1 tablet (75 mg total) by mouth daily., Disp: , Rfl:  .  furosemide (LASIX) 40 MG tablet, Take 1.5 tablets (60 mg total) by mouth 2 (two) times daily., Disp: 30 tablet, Rfl:  .  gabapentin (NEURONTIN) 100 MG capsule, Take 100 mg by mouth 3 (three) times daily. , Disp: , Rfl:  .  glucagon (GLUCAGON EMERGENCY) 1 MG injection, Inject 1 mg into the vein once as needed (hypoglycemia). , Disp: , Rfl:  .  insulin glargine (LANTUS) 100 UNIT/ML injection, Inject 0.06 mLs (6 Units total) into the skin 2 (two) times daily. (Patient taking differently: Inject 5 Units into the skin 2 (two) times daily. ), Disp: 10 mL, Rfl: 0 .  insulin lispro (HUMALOG KWIKPEN) 100 UNIT/ML KiwkPen, 6 units [place] before breakfast, 5 units [place] before lunch, 4 units [place] before supper, Disp: 15 mL, Rfl: 11 .  ketorolac (ACULAR) 0.4 % SOLN, Place 1 drop into the left eye 4 (four) times  daily., Disp: , Rfl:  .  levothyroxine (SYNTHROID, LEVOTHROID) 100 MCG tablet, Take 100 mcg by mouth daily before breakfast., Disp: , Rfl:  .  linagliptin (TRADJENTA) 5 MG TABS tablet, Take 5 mg by mouth daily., Disp: , Rfl:  .  lisinopril (PRINIVIL,ZESTRIL) 20 MG tablet, Take 20 mg by mouth daily., Disp: , Rfl:  .  NON FORMULARY, Take 120 mLs by mouth 3 (three) times daily. , Disp: , Rfl:  .  ondansetron (ZOFRAN-ODT) 4 MG disintegrating tablet, Take 4 mg by mouth every 8 (eight) hours as needed for nausea  or vomiting., Disp: , Rfl:  .  OVER THE COUNTER MEDICATION, Take 1 scoop by mouth 2 (two) times daily. Protein powder mixed with food/drink (25 g of protein per serving) PURE PROTEIN, Disp: , Rfl:  .  oxyCODONE (OXY IR/ROXICODONE) 5 MG immediate release tablet, Take 5 mg by mouth every 12 (twelve) hours as needed (heel pain)., Disp: , Rfl:  .  polyethylene glycol (MIRALAX / GLYCOLAX) packet, Take 17 g by mouth 2 (two) times daily., Disp: , Rfl:  .  prednisoLONE acetate (PRED FORTE) 1 % ophthalmic suspension, Place 1 drop into the left eye 4 (four) times daily., Disp: , Rfl:  .  PRESCRIPTION MEDICATION, Take 1 each by mouth 2 (two) times daily. Magic cup. Twice daily, Disp: , Rfl:  .  sennosides-docusate sodium (SENOKOT-S) 8.6-50 MG tablet, Take 2 tablets by mouth 2 (two) times daily. , Disp: , Rfl:  .  tamsulosin (FLOMAX) 0.4 MG CAPS capsule, Take 0.8 mg by mouth at bedtime. , Disp: , Rfl:    T 98.2 O2 00% HR 73 BP 132/72  Wt Readings from Last 3 Encounters:  03/29/16 162 lb (73.5 kg)  12/04/15 158 lb (71.7 kg)  09/20/15 158 lb (71.7 kg)   Review of Systems:  Per HPI  PHYSICAL EXAM:. General: No acute distress, well nourished, pleasant HEENT:  No scleral icterus, no nasal secretions, [place] moist and no erythema or lesion Neck:  Supple, No JVD, no lymphadenopathy CV:  RRR, no murmur, no ankle swelling RESP: No resp distress or accessory muscle use.  Clear to ausc bilat. No wheezing, no rales, no rhonchi.  ABD:  Soft, Non-tender, non-distended, +bowel sounds, no masses MSK:  Tenderness over lumbar spine with bilateral paraspinal radiation, no joint pain.  No joint swelling or redness Skin:  No significant skin lesions or rash Neurologic:  Cranial nerves 2-12 grossly intact, normal tone in extremities, normal strength in extremities Psych:  Fully oriented, Judgment and insight normal, Memory normal for long and short term, Mood and affect appropriate   Assessment(s):    Viral  Gastrointeritis Low Back Pain  Plan(s): 1.  Symptomatic treatment for Viral Gastroenteritis. Zofran prescribed 2.  Will obtain xray of lumbar spine, portable. If normal, will obtain further xray views at outside facility. 3.  Prescription for Oxycodone written given acute pain 4.  Continue to monitor.   Code Status:     Code Status History    Date Active Date Inactive Code Status Order ID Comments User Context   09/01/2015 12:47 AM 09/01/2015 10:56 PM Full Code [id number]  Asiyah Cletis Media, MD Inpatient   02/06/2015  3:53 AM 02/07/2015  8:37 PM Full Code [id number]  Sela Hua, MD Inpatient   09/16/2014  4:49 PM 09/17/2014  5:49 PM Full Code [id number]  Oswald Hillock, MD Inpatient   04/09/2014  7:59 PM 04/18/2014 10:17 PM Full Code [id number]  Ripudeep Krystal Eaton, MD  Inpatient

## 2016-05-27 NOTE — Addendum Note (Signed)
Addended byWendy Poet, Nyle Limb D on: 05/27/2016 11:30 AM   Modules accepted: Level of Service

## 2016-06-13 ENCOUNTER — Other Ambulatory Visit (HOSPITAL_COMMUNITY): Payer: Self-pay | Admitting: *Deleted

## 2016-06-14 ENCOUNTER — Ambulatory Visit (HOSPITAL_COMMUNITY)
Admission: RE | Admit: 2016-06-14 | Discharge: 2016-06-14 | Disposition: A | Discharge status: Home / Self Care | Payer: Medicare Other | Source: Ambulatory Visit | Attending: Nephrology | Admitting: Nephrology

## 2016-06-14 DIAGNOSIS — D631 Anemia in chronic kidney disease: Secondary | ICD-10-CM | POA: Insufficient documentation

## 2016-06-14 DIAGNOSIS — N189 Chronic kidney disease, unspecified: Secondary | ICD-10-CM

## 2016-06-14 DIAGNOSIS — N184 Chronic kidney disease, stage 4 (severe): Secondary | ICD-10-CM | POA: Diagnosis not present

## 2016-06-14 LAB — POCT HEMOGLOBIN-HEMACUE: Hemoglobin: 8.9 g/dL — ABNORMAL LOW (ref 13.0–17.0)

## 2016-06-14 MED ORDER — DARBEPOETIN ALFA 150 MCG/0.3ML IJ SOSY
150.0000 ug | PREFILLED_SYRINGE | INTRAMUSCULAR | Status: DC
  Administered 2016-06-14: 13:00:00 150 ug via SUBCUTANEOUS

## 2016-06-14 MED ORDER — SODIUM CHLORIDE 0.9 % IV SOLN
510.0000 mg | Freq: Once | INTRAVENOUS | Status: AC
  Administered 2016-06-14: 510 mg via INTRAVENOUS
  Filled 2016-06-14: qty 17

## 2016-06-14 MED ORDER — DARBEPOETIN ALFA 150 MCG/0.3ML IJ SOSY
PREFILLED_SYRINGE | INTRAMUSCULAR | Status: AC
  Filled 2016-06-14: qty 0.3

## 2016-06-14 NOTE — Discharge Instructions (Signed)

## 2016-06-15 IMAGING — CT CT HEAD W/O CM
3 series · 15 of 47 positions shown, 18 images · non-contrast
Comparison: MRI brain dated 04/12/2014

CLINICAL DATA: Altered mental status, history of stroke

EXAM:
CT HEAD WITHOUT CONTRAST
TECHNIQUE: Contiguous axial images were obtained from the base of the skull
through the vertex without intravenous contrast.

[Series 2: head 5.0 st · axial · 0.46mm/px · z∈[-50,+95]mm · 9 of 35 slices shown, 12 images]
[im 3/35  brain]
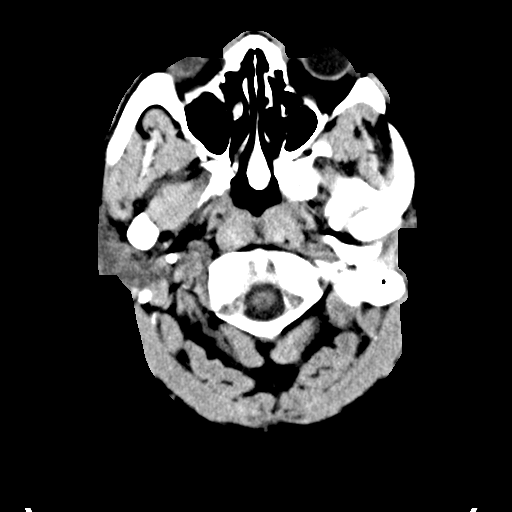
[im 3/35  bone]
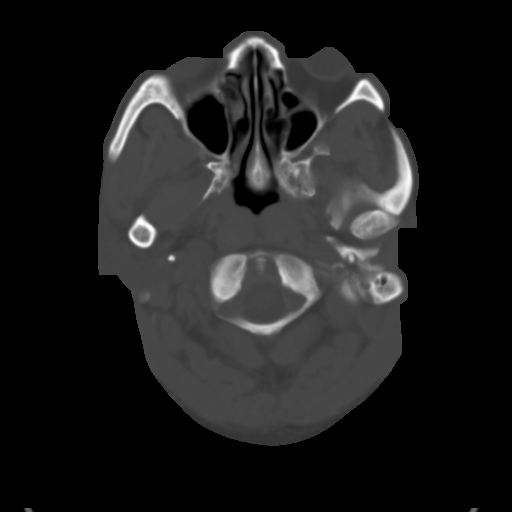
[im 6/35  brain]
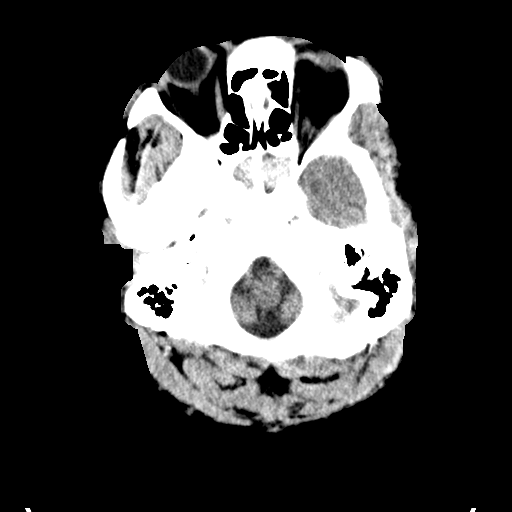
[im 10/35  brain]
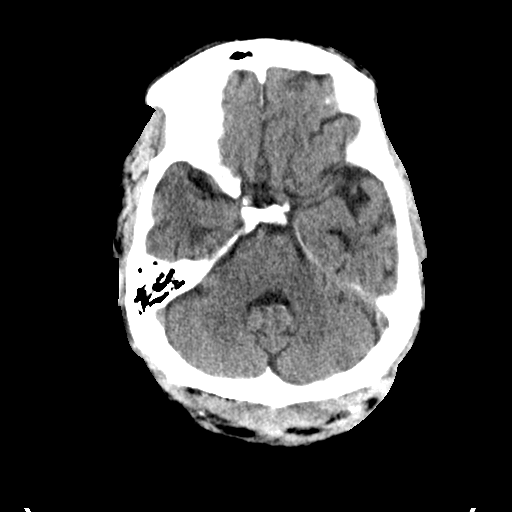
[im 13/35  brain]
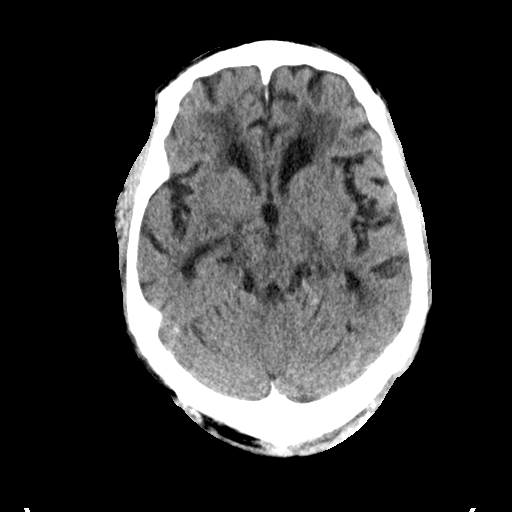
[im 18/35  brain]
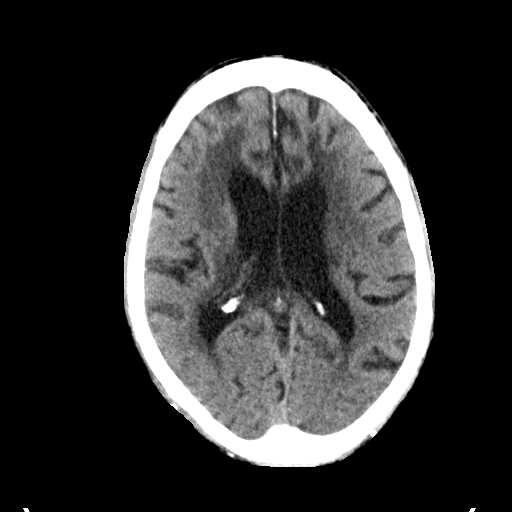
[im 18/35  bone]
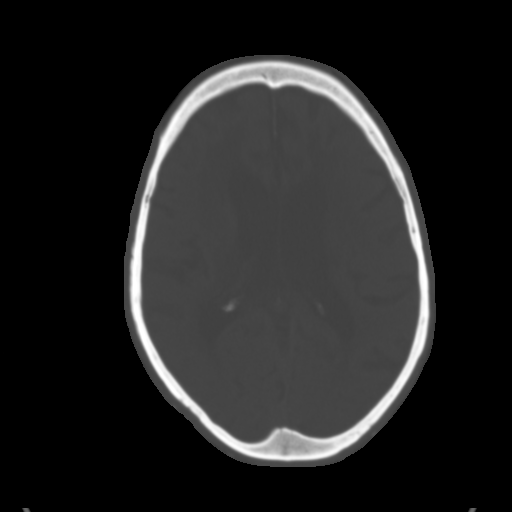
[im 22/35  brain]
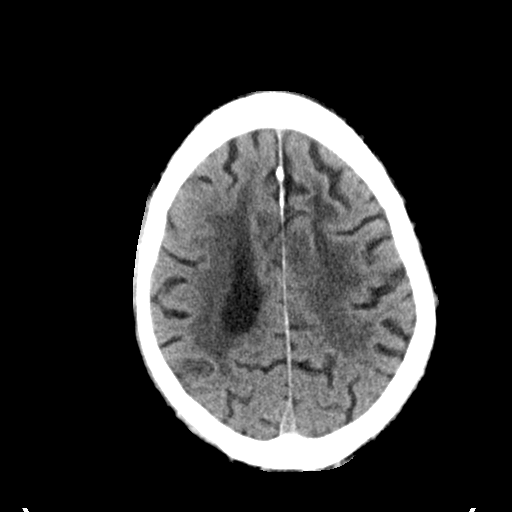
[im 25/35  brain]
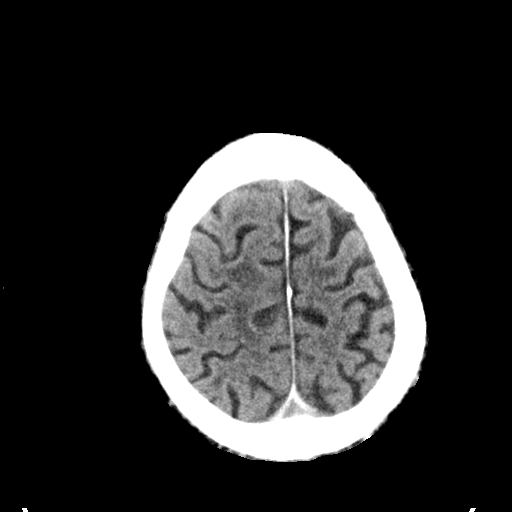
[im 29/35  brain]
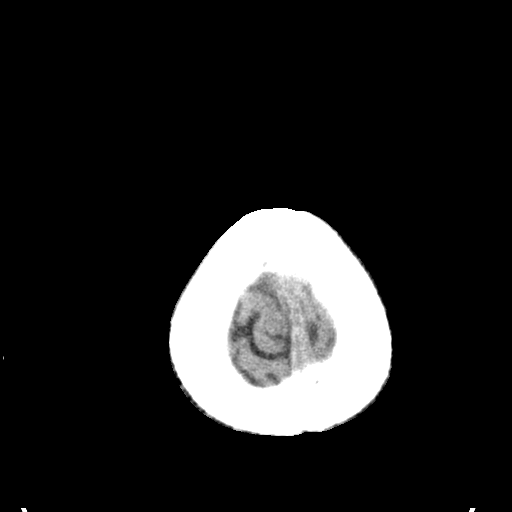
[im 32/35  brain]
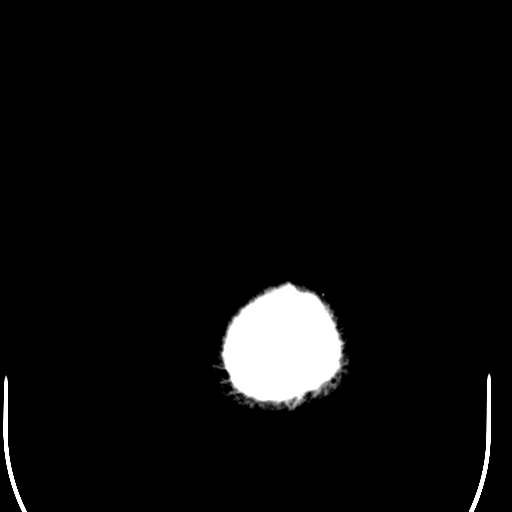
[im 32/35  bone]
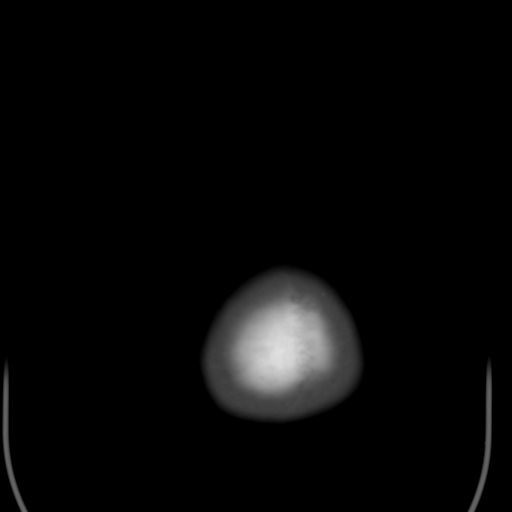

[Series 4: head 3.0 cor st · coronal · 0.32mm/px · 3 of 72 slices shown]
[im 24/72  brain]
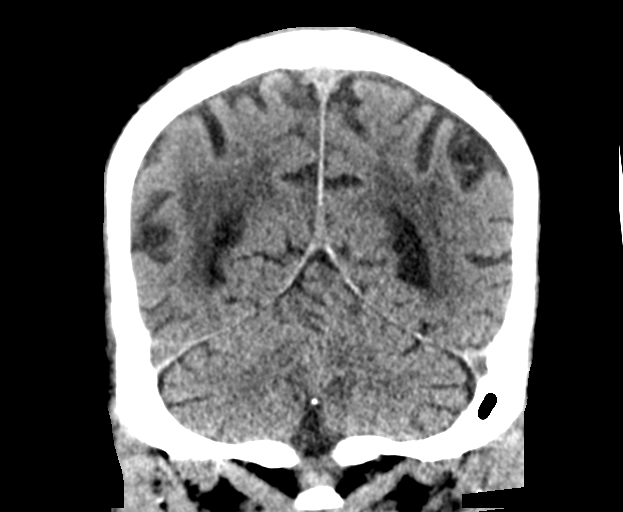
[im 32/72  brain]
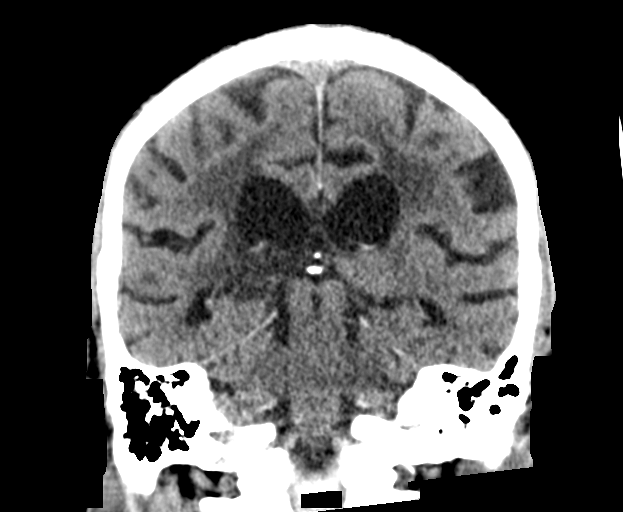
[im 40/72  brain]
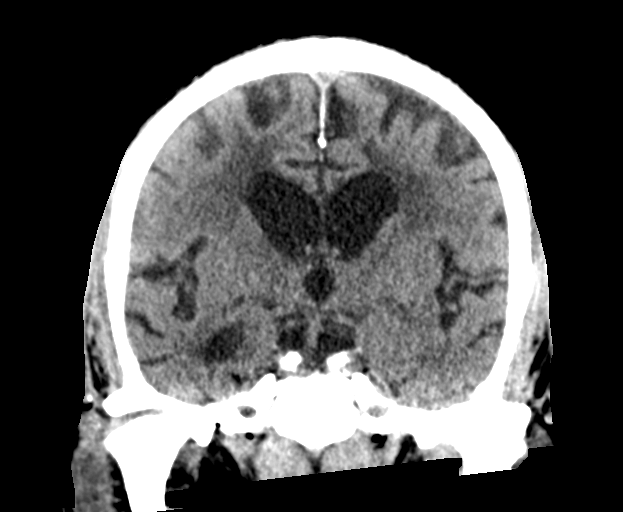

[Series 5: head 3.0 sag st · sagittal · 0.34mm/px · 3 of 67 slices shown]
[im 24/67  brain]
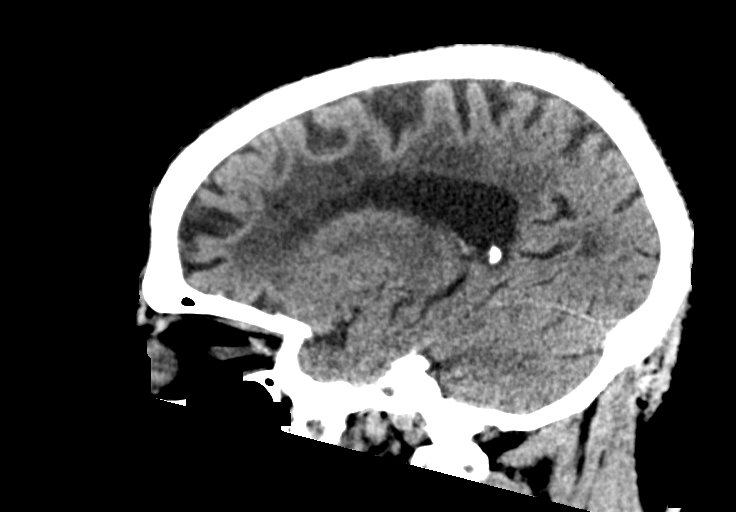
[im 34/67  brain]
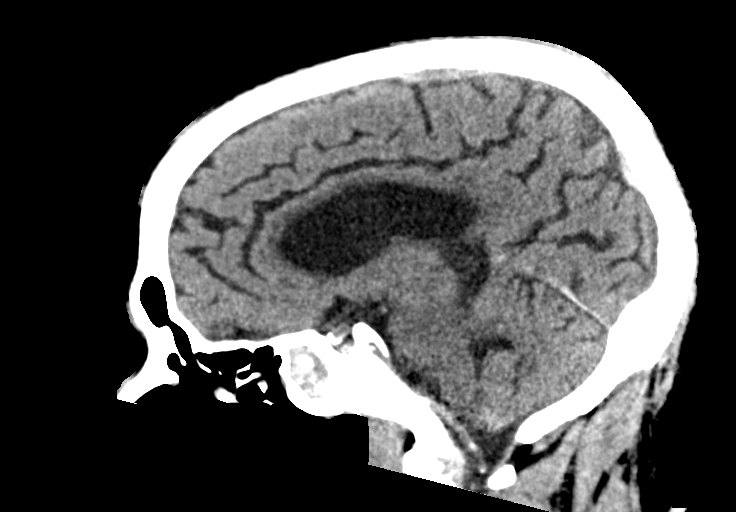
[im 44/67  brain]
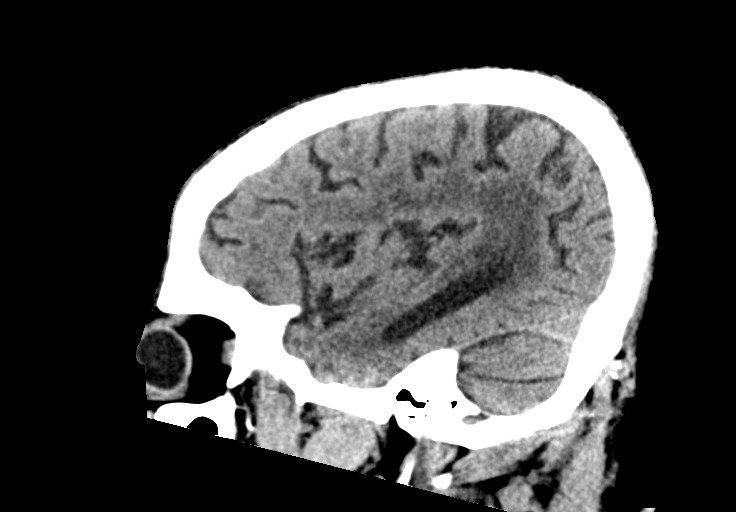

[15 of 47 positions shown; findings below may reference images not displayed]

FINDINGS: No evidence of parenchymal hemorrhage or extra-axial fluid
collection. No mass lesion, mass effect, or midline shift.

No CT evidence of acute infarction.

Old right thalamic lacunar infarct.

Extensive small vessel ischemic changes.

Mild cortical atrophy.  No ventriculomegaly.

Chronic opacification of the bilateral sphenoid sinuses with partial
opacification of the bilateral ethmoid sinuses. Mastoid air cells
are clear.

No evidence of calvarial fracture.
IMPRESSION: No evidence of acute intracranial abnormality.

Old right thalamic lacunar infarct.

Atrophy with small vessel ischemic changes.

## 2016-06-18 ENCOUNTER — Telehealth: Payer: Self-pay | Admitting: *Deleted

## 2016-06-18 ENCOUNTER — Telehealth: Payer: Self-pay

## 2016-06-18 ENCOUNTER — Ambulatory Visit (INDEPENDENT_AMBULATORY_CARE_PROVIDER_SITE_OTHER): Payer: Medicare Other | Admitting: Gastroenterology

## 2016-06-18 ENCOUNTER — Encounter: Payer: Self-pay | Admitting: Gastroenterology

## 2016-06-18 VITALS — BP 140/84 | HR 78 | Ht 65.0 in | Wt 162.0 lb

## 2016-06-18 DIAGNOSIS — IMO0001 Reserved for inherently not codable concepts without codable children: Secondary | ICD-10-CM

## 2016-06-18 DIAGNOSIS — Z794 Long term (current) use of insulin: Secondary | ICD-10-CM

## 2016-06-18 DIAGNOSIS — R195 Other fecal abnormalities: Secondary | ICD-10-CM

## 2016-06-18 DIAGNOSIS — K625 Hemorrhage of anus and rectum: Secondary | ICD-10-CM | POA: Insufficient documentation

## 2016-06-18 DIAGNOSIS — E119 Type 2 diabetes mellitus without complications: Secondary | ICD-10-CM | POA: Diagnosis not present

## 2016-06-18 DIAGNOSIS — Z86010 Personal history of colonic polyps: Secondary | ICD-10-CM

## 2016-06-18 DIAGNOSIS — Z7902 Long term (current) use of antithrombotics/antiplatelets: Secondary | ICD-10-CM | POA: Diagnosis not present

## 2016-06-18 HISTORY — DX: Other fecal abnormalities: R19.5

## 2016-06-18 NOTE — Telephone Encounter (Signed)
Dr. Cyndia Skeeters is not aware of any records for colonoscopy. He does not think patient has had once completed. Derl Barrow, RN

## 2016-06-18 NOTE — Telephone Encounter (Signed)
Larene Beach, with [facility] called to check if [facility] had any record of patient's last colonoscopy.  Will forward to PCP.  Please call [phone].  Derl Barrow, RN

## 2016-06-18 NOTE — Progress Notes (Signed)
06/18/2016 [name] [id number] Feb 21, 1946   HISTORY OF PRESENT ILLNESS:  This is a 71 year old male who is new to our practice. He is here today at the request of Dr. McDiarmid for evaluation regarding blood in his stools and heme positive stools.  He is a resident at [facility]. Patient has a past medical history of cerebrovascular disease with history of stroke and is non-ambulatory, insulin-dependent diabetes mellitus, chronic combined systolic and diastolic CHF, chronic kidney disease stage IV, hepatitis C, hypertension, hypothyroidism, hypercholesterolemia. He is here today with a staff member from the facility who is not familiar with the patient. It appears that he had a colonoscopy in February 2002 by Dr. Katherina Mires at which time he was found to have "area resected where the polyp originally noted. This tissue was examined in a polyp could not be detected." This was performed for history of polyps seen on prior colonoscopy. He apparently had to have surgical resection of a polyp prior to that.  I do not have any history except that the patient has apparently been having red blood in his stool. Most recent hemoglobin on March 23 was 8.9 g. It appears that over the past year his Hgb has remained around 9 g. This is a normocytic anemia with an MCV of 89.4. No recent iron studies.   Past Medical History:  Diagnosis Date  . AKI (acute kidney injury) ([place])   . Altered mental status 08/31/2015  . Bedridden    transfers to chair  . Cerebrovascular disease    a. 01/2007 MRI/A: L MCA 34-28%, R MCA 76%, LICA 81%.  . Chronic combined systolic and diastolic CHF, NYHA class 2 ([place])    a. 01/2007 Echo: EF 60-65%;  b. 03/2014 Echo: EF 45-50%, Gr 1 DD, severe LVH.  Marland Kitchen Chronic kidney disease (CKD), stage IV (severe) ([place]) 05/27/2014  . CKD (chronic kidney disease), stage IV ([place])    [facility]  . Depression   . GERD (gastroesophageal reflux disease)   . Glaucoma   .  Headache   . Hepatitis dx'd 03/2014   C  . Hepatitis C infection 01/04/2015   - Pt with HCV (+) antibody and HCV RNA Quant Log = 5.87 (H) (04/10/14)  . History of cerebrovascular accident with residual effects    a. 01/2007 MRI/A: L MCA 15-72%, R MCA 62%, LICA 03%.   Marland Kitchen Hypercholesterolemia   . Hypertension   . Hypoglycemia 02/06/2015  . Hypothyroidism   . Pneumonia 10/2006  . Stroke ([place]) 01/2007   L corona radiata infarct; "left his right side weak" (09/16/2014)  . Stroke [facility]) 03/2014   "while in hospital; left side extremely weak since" (09/16/2014)  . Type II diabetes mellitus ([place])    Type II   Past Surgical History:  Procedure Laterality Date  . CATARACT EXTRACTION EXTRACAPSULAR Left 12/21/2014   Procedure: CATARACT EXTRACTION EXTRACAPSULAR WITH INTRAOCULAR LENS PLACEMENT LEFT EYE;  Surgeon: [name], MD;  Location: [place];  Service: Ophthalmology;  Laterality: Left;  . COLON SURGERY     Polyps removed- surgically  . COLONOSCOPY  2015  . COLONOSCOPY W/ BIOPSIES AND POLYPECTOMY  2002  . TRABECULECTOMY Left 08/18/2013   Procedure: TRABECULECTOMY WITH TUBE WITH [facility];  Surgeon: [name], MD;  Location: [place];  Service: Ophthalmology;  Laterality: Left;    reports that he has never smoked. He has never used smokeless tobacco. He reports that he does not drink alcohol or use drugs. family history includes  Hypertension in his father. No Known Allergies    Outpatient Encounter Prescriptions as of 06/18/2016  Medication Sig  . acetaminophen (TYLENOL) 325 MG tablet Take 650 mg by mouth every 6 (six) hours as needed.  Marland Kitchen atorvastatin (LIPITOR) 40 MG tablet Take 1 tablet (40 mg total) by mouth daily. (Patient taking differently: Take 80 mg by mouth daily. )  . bimatoprost (LUMIGAN) 0.01 % SOLN Place 1 drop into the right eye at bedtime.  . calcium citrate (CALCITRATE - DOSED IN MG ELEMENTAL CALCIUM) 950 MG tablet Take 1 tablet (200 mg of elemental calcium total) by mouth daily.  .  carvedilol (COREG) 12.5 MG tablet Take 1 tablet (12.5 mg total) by mouth 2 (two) times daily with a meal.  . clopidogrel (PLAVIX) 75 MG tablet Take 1 tablet (75 mg total) by mouth daily.  . furosemide (LASIX) 40 MG tablet Take 1.5 tablets (60 mg total) by mouth 2 (two) times daily.  Marland Kitchen gabapentin (NEURONTIN) 100 MG capsule Take 100 mg by mouth 3 (three) times daily.   Marland Kitchen glucagon (GLUCAGON EMERGENCY) 1 MG injection Inject 1 mg into the vein once as needed (hypoglycemia).   . insulin glargine (LANTUS) 100 UNIT/ML injection Inject 0.06 mLs (6 Units total) into the skin 2 (two) times daily. (Patient taking differently: Inject 5 Units into the skin 2 (two) times daily. )  . insulin lispro (HUMALOG KWIKPEN) 100 UNIT/ML KiwkPen 6 units [place] before breakfast, 5 units [place] before lunch, 4 units [place] before supper  . ketorolac (ACULAR) 0.4 % SOLN Place 1 drop into the left eye 4 (four) times daily.  Marland Kitchen levothyroxine (SYNTHROID, LEVOTHROID) 100 MCG tablet Take 100 mcg by mouth daily before breakfast.  . linagliptin (TRADJENTA) 5 MG TABS tablet Take 5 mg by mouth daily.  Marland Kitchen lisinopril (PRINIVIL,ZESTRIL) 20 MG tablet Take 20 mg by mouth daily.  . NON FORMULARY Take 120 mLs by mouth 3 (three) times daily.   . ondansetron (ZOFRAN-ODT) 4 MG disintegrating tablet Take 4 mg by mouth every 8 (eight) hours as needed for nausea or vomiting.  Marland Kitchen OVER THE COUNTER MEDICATION Take 1 scoop by mouth 2 (two) times daily. Protein powder mixed with food/drink (25 g of protein per serving) PURE PROTEIN  . oxyCODONE (OXY IR/ROXICODONE) 5 MG immediate release tablet Take 5 mg by mouth every 12 (twelve) hours as needed (heel pain).  . polyethylene glycol (MIRALAX / GLYCOLAX) packet Take 17 g by mouth 2 (two) times daily.  . prednisoLONE acetate (PRED FORTE) 1 % ophthalmic suspension Place 1 drop into the left eye 4 (four) times daily.  Marland Kitchen PRESCRIPTION MEDICATION Take 1 each by mouth 2 (two) times daily. Magic cup. Twice daily  .  sennosides-docusate sodium (SENOKOT-S) 8.6-50 MG tablet Take 2 tablets by mouth 2 (two) times daily.   . tamsulosin (FLOMAX) 0.4 MG CAPS capsule Take 0.8 mg by mouth at bedtime.    No facility-administered encounter medications on file as of 06/18/2016.      REVIEW OF SYSTEMS  : All other systems reviewed and negative except where noted in the History of Present Illness.   PHYSICAL EXAM: BP 140/84   Pulse 78   Ht 5\' 5"  (1.651 m)   Wt 162 lb (73.5 kg)   SpO2 97%   BMI 26.96 kg/m  General: Chronically ill-appearing black male in no acute distress; in wheelchair Head: Normocephalic and atraumatic Eyes:  Sclerae anicteric, conjunctiva pink. Ears: Normal auditory acuity Lungs: Clear throughout to auscultation; no increased WOB.  Heart: Regular rate and rhythm Abdomen: Soft, non-distended. Normal bowel sounds.  Non-tender. Rectal:  No external abnormalities noted.  DRE did not reveal any masses.  Soft stool in rectal vault.  Medium brown stool on exam glove, no gross blood noted. Musculoskeletal:  Lower extremity stiffness and contractures noted. Skin: No lesions on visible extremities Neurological: Alert oriented x 4, grossly non-focal Psychological:  Alert and cooperative. Normal mood and affect  ASSESSMENT AND PLAN: -71 year old male with reports of rectal bleeding and heme positive stools. He has a normocytic anemia, which has been stable over the past year. That is likely in part secondary to his chronic kidney disease. Has a history of colon polyps apparently in the past, appears that at some point he had some type of surgical resection for this. There was mention of a possible more recent colonoscopy, maybe in 2015, but after extensive attempts we have been unable to find anything of that sort.  Discussed with Dr. Havery Moros. We'll plan for colonoscopy at [facility]. He is going to work with his nursing staff to schedule this procedure, get authorization to hold Plavix, and coordinate  with his [facility] facility. -IDDM:  Insulin will be adjusted prior to endoscopic procedure per protocol. Will resume normal dosing after procedure. -CVD with history of stroke on Plavix:  Hold Plavix for 5 days before procedure - will instruct when and how to resume after procedure. Risks and benefits of procedure including bleeding, perforation, infection, missed lesions, medication reactions and possible hospitalization or surgery if complications occur explained. Additional rare but real risk of cardiovascular event such as heart attack or ischemia/infarct of other organs off Plavix explained and need to seek urgent help if this occurs. Will communicate by phone or EMR with patient's prescribing provider that to confirm holding Plavix is reasonable in this case.   CC:  Mercy Riding, MD

## 2016-06-18 NOTE — Telephone Encounter (Signed)
Spoke with patients wife to see if she had any idea or record of last colonoscopy, she is unsure of last exam.  Also spoke with nurse at [facility] and they have no record of colon, but provider is going to call if he has any other information.

## 2016-06-19 NOTE — Progress Notes (Signed)
Agree with assessment and plan as outlined. He can be on aspirin while holding plavix for his procedure.

## 2016-06-28 ENCOUNTER — Encounter: Payer: Self-pay | Admitting: Student

## 2016-06-28 LAB — LIPID PANEL
Cholesterol: 107 MG/DL — AB (ref 120–200)
HDL: 36 mg/dL (ref 35–85)
LDL CALC: 60 (ref 0–99)
Ratio: 2.9 (ref 2.5–4.5)
Triglycerides: 54 MG/DL (ref 30–149)
VLDL CHOLESTEROL, CALC: 11 (ref 8–39)

## 2016-06-28 LAB — HEMOGLOBIN A1C: HEMOGLOBIN A1C: 7.2 % — AB (ref 4.0–6.0)

## 2016-07-08 ENCOUNTER — Encounter: Payer: Self-pay | Admitting: Student

## 2016-07-08 LAB — CBC WITH DIFFERENTIAL
BASOS ABS: 0 /uL (ref 0–0)
Basophils, %: 0.9 (ref 0–2.0)
EOS ABS: 0 /uL (ref 0–1)
Eosinophils, %: 1.4 (ref 0–7.0)
HEMATOCRIT: 29 % — AB (ref 42–52)
Hemoglobin: 9.2 — AB (ref 14.0–18.0)
LYMPHOCYTES, %: 41.5 (ref 10.0–50.0)
LYMPHS ABS: 2 /uL (ref 1–3)
MCH: 30.6 (ref 26.0–32.0)
MCHC: 31.2 (ref 31.0–36.0)
MCV: 98.1 — AB (ref 80.0–95.0)
MONOCYTES RELATIVE % (KUC): 12 % — AB (ref 2–10)
MPV: 6.5 fL — AB (ref 7.5–11.5)
Monocytes Absolute: 1 /uL (ref 0–1)
NEUTROPHILS, %: 44.2 (ref 37.0–80.0)
Neutrophils Absolute: 2 /uL (ref 2–7)
PLATELETS: 117 — AB (ref 130–440)
RBC: 3 — AB (ref 4.7–6.1)
RDW: 14.8 (ref 11.5–16.0)
WBC: 4.6 10*3/uL (ref 4.1–10.9)

## 2016-07-08 LAB — BASIC METABOLIC PANEL
ANION GAP: 21 mmol/L
BUN: 70 mg/dL — AB (ref 4–21)
CALCIUM 24H UR: 8.1 — AB (ref 8.6–10.2)
CO2: 19 — AB (ref 22–31)
CREATININE: 3.13 — AB (ref 0.5–1.2)
Chloride: 111 mmol/L — AB (ref 99–108)
EGFR (African American): 22.13
Glucose: 134 mg/dL — AB (ref 70–99)
Osmolality: 313.1 — AB (ref 285–295)
POTASSIUM: 4.7 mmol/L (ref 3.4–5.3)
Sodium: 146 mmol/L (ref 137–147)

## 2016-07-11 ENCOUNTER — Non-Acute Institutional Stay: Payer: Medicare Other | Admitting: Student

## 2016-07-11 DIAGNOSIS — N184 Chronic kidney disease, stage 4 (severe): Secondary | ICD-10-CM

## 2016-07-11 DIAGNOSIS — K625 Hemorrhage of anus and rectum: Secondary | ICD-10-CM

## 2016-07-11 DIAGNOSIS — I420 Dilated cardiomyopathy: Secondary | ICD-10-CM | POA: Diagnosis not present

## 2016-07-11 DIAGNOSIS — D126 Benign neoplasm of colon, unspecified: Secondary | ICD-10-CM

## 2016-07-11 DIAGNOSIS — I10 Essential (primary) hypertension: Secondary | ICD-10-CM

## 2016-07-11 DIAGNOSIS — E119 Type 2 diabetes mellitus without complications: Secondary | ICD-10-CM | POA: Diagnosis not present

## 2016-07-11 DIAGNOSIS — B182 Chronic viral hepatitis C: Secondary | ICD-10-CM | POA: Diagnosis not present

## 2016-07-11 DIAGNOSIS — Z86010 Personal history of colonic polyps: Secondary | ICD-10-CM

## 2016-07-11 DIAGNOSIS — D631 Anemia in chronic kidney disease: Secondary | ICD-10-CM | POA: Diagnosis not present

## 2016-07-11 DIAGNOSIS — N189 Chronic kidney disease, unspecified: Secondary | ICD-10-CM

## 2016-07-11 DIAGNOSIS — I739 Peripheral vascular disease, unspecified: Secondary | ICD-10-CM

## 2016-07-11 DIAGNOSIS — E782 Mixed hyperlipidemia: Secondary | ICD-10-CM

## 2016-07-11 DIAGNOSIS — IMO0001 Reserved for inherently not codable concepts without codable children: Secondary | ICD-10-CM

## 2016-07-11 DIAGNOSIS — Z794 Long term (current) use of insulin: Secondary | ICD-10-CM

## 2016-07-11 DIAGNOSIS — I63031 Cerebral infarction due to thrombosis of right carotid artery: Secondary | ICD-10-CM | POA: Diagnosis not present

## 2016-07-11 DIAGNOSIS — D125 Benign neoplasm of sigmoid colon: Secondary | ICD-10-CM | POA: Diagnosis not present

## 2016-07-11 MED ORDER — INSULIN GLARGINE 100 UNIT/ML ~~LOC~~ SOLN: 5.0000 [IU] | Freq: Two times a day (BID) | SUBCUTANEOUS | Status: DC

## 2016-07-11 MED ORDER — ATORVASTATIN CALCIUM 80 MG PO TABS: 80.0000 mg | ORAL_TABLET | Freq: Every day | ORAL | Status: DC

## 2016-07-11 NOTE — Assessment & Plan Note (Addendum)
Had colonoscopy in 2001 that showed sessile cecal polyp about 1-2 mm long and pedunculated sigmoid colon polyp about 20-25 mm long. Both were cauterized and retrieved and sent for pathology. Pathology adenomatous and TUBULOVILLOUS ADENOMA respectively. No dysplasia noted.  Not sure if he had another colonoscopy after that.  Noted by his RN about a month ago.  Was evaluated at [facility]. No obvious blood noted on exam. GI planning to do colonoscopy but not sure when. Hgb stable.   Will touch base with GI to follow up on this.

## 2016-07-11 NOTE — Assessment & Plan Note (Addendum)
Stable. Last Echo in 03/2014 with EF of 45-50%, G1DD and no other abnormalities. Dry weight ~160 lbs. His most recent weight is 164 lbs. No symptoms except LE edema which appears to be at baseline.   Q-shift check for shortness of breath, chest pain, edema and JVD, and leg elevation.   Continue carvedilol 12.5 mg twice a day and Furosamide 60 mg twice a day.

## 2016-07-11 NOTE — Assessment & Plan Note (Addendum)
Last A1c 7.2% about one month ago.  Continue current medications (Lantus, Humalog and Tradjenta)  Foot exam completed today (diminished sensation in his toes, no palpable DP and PT pulses and thick, grayish and hyperkeratinized toe nails)  Daily foot check  Due for eye exam in three months  Need dental visit  Off ACEi due to kidney function.

## 2016-07-11 NOTE — Assessment & Plan Note (Signed)
Stable. Followed at [facility]. Recent renal function stable except for some uremia (BUN 70) and AGMA, likely due to uremia. No uremic symptoms.   Off lisinopril  CMP, P and Mg in a week.

## 2016-07-11 NOTE — Assessment & Plan Note (Signed)
Treated and cured.

## 2016-07-11 NOTE — Progress Notes (Signed)
Subjective:    [name] is a 71 y.o. old male. CC: regulatory visit HPI  Met patient in the nursing home cafeteria about 3:30 pm. He was playing bingo game with his fellow residents and volunteers. With the permission from activity facilitator, we went to his room for interview and exam. Patient was in good mood. He is happy about the visit. He talked how happy he was when his children visited from [place] for his birth day two months ago. He is excited about his 71th birthday part comes next year. He says his wife visits in the morning hours when she gets off work. He has no complaint or concern. He is happy about the care he has been getting at the nursing home staff.   PMH: reviewed and updated Medications list reviewed and updated  Review of Systems  Constitutional: Negative for appetite change, fever and unexpected weight change.  HENT: Negative for facial swelling, hearing loss, trouble swallowing and voice change.   Eyes: Positive for visual disturbance.  Respiratory: Negative for cough, chest tightness and shortness of breath.   Cardiovascular: Positive for leg swelling. Negative for chest pain and palpitations.  Gastrointestinal: Negative for abdominal pain, constipation, diarrhea, nausea and vomiting.  Endocrine: Negative for cold intolerance, heat intolerance and polyuria.  Genitourinary: Negative for dysuria and urgency.  Musculoskeletal: Negative for arthralgias, back pain and myalgias.  Skin: Negative for rash and wound.  Neurological: Negative for light-headedness and headaches.  Psychiatric/Behavioral: The patient is not nervous/anxious.    Per HPI Objective:  Weight trends: 09/12/2015: 158 lbs 09/24/2015: 160 lbs 10/27/2015: 162 lbs 12/28/2015: 161.8 lbs  04/29/2016: 157 lbs 05/28/2016: 160 lbs 06/29/2016: 164 lbs   GEN: Wheel chair-bound. In good mood.  Head: normocephalic and atraumatic,  Eyes: without conjunctival injection, sclera  anicteric Ears: hearing grossly intact Oropharynx: mmm without erythema or exudation. Poor dentition.  CVS: RRR, normal s1 and s2, no murmurs, 2+ edema in his right leg, 1+ in his left. Diminished DP and PT pulses.  RESP: no increased work of breathing, good air movement bilaterally, no crackles or wheeze GI: soft, non-tender,non-distended, bowel sounds present and normal MSK: some muscle atrophy with edema in his lower extremities.  SKIN: scaling of skin in his feet, no maceration. Noted dry scabbed lesion about 4 mm in diameter over medial aspect of his right second toe. no sign of cellulitis or infection. Globally diminished sensation in his leg HEM: no cervical lymphadenopathy NEURO: awake, alert, oriented to self, person, month, year and country but not date or day of the week. Cranial nerves grossly intact. Motor 3/5 in LE's and 5/5 in UE's bilaterally, course tremor/ataxia with activity in upper extremities bilaterally PSYCH: in good mood with appropriate affect.     Assessment & Plan:  Congestive dilated cardiomyopathy (HCC) Stable. Last Echo in 03/2014 with EF of 45-50%, G1DD and no other abnormalities. Dry weight ~160 lbs. His most recent weight is 164 lbs. No symptoms except LE edema which appears to be at baseline.   Q-shift check for shortness of breath, chest pain, edema and JVD, and leg elevation.   Continue carvedilol 12.5 mg twice a day and Furosamide 60 mg twice a day.    Essential hypertension Stable. Recent BP's in 130's/70's.  Continue current medications (Coreg and Lasix) as above.   Last BMP one week ago notable for some uremia and mild AGMA but no uremic symptoms. sCr improved.   Repeat CMP in a week.   IDDM (  insulin dependent diabetes mellitus) (HCC) Last A1c 7.2% about one month ago.  Continue current medications (Lantus, Humalog and Tradjenta)  Foot exam completed today (diminished sensation in his toes, no palpable DP and PT pulses and thick, grayish and  hyperkeratinized toe nails)  Daily foot check  Due for eye exam in three months  Need dental visit  Off ACEi due to kidney function.   Chronic kidney disease (CKD), stage IV (severe) (HCC) Stable. [facility] at [facility]. Recent renal function stable except for some uremia (BUN 70) and AGMA, likely due to uremia. No uremic symptoms.   Off lisinopril  CMP, P and Mg in a week.   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 Mental status better from when I saw him last. Has had recent carotid doppler which is basically normal.   Continue Plavix and Lipitor  Continue PT  Anemia of renal disease Stable. Recent Hgb 9.2 (b/l) about a week ago.   Nephrology following.   Chronic hepatitis C without hepatic coma (HCC) Treated and cured.  Rectal bleeding Had colonoscopy in 2001 that showed sessile cecal polyp about 1-2 mm long and pedunculated sigmoid colon polyp about 20-25 mm long. Both were cauterized and retrieved and sent for pathology. Pathology adenomatous and TUBULOVILLOUS ADENOMA respectively. No dysplasia noted.  Not sure if he had another colonoscopy after that.  Noted by his RN about a month ago.  Was evaluated at [facility]. No obvious blood noted on exam. GI planning to do colonoscopy but not sure when. Hgb stable.   Will touch base with GI to follow up on this.   PAD (peripheral artery disease) (HCC) DP and PT pulses not palpable. Dry scaly skin in his feet bilaterally. Also with 2+ pitting edema in right and 1+ pitting edema in left.  Already on Plavix for CVA  Will consider Aspirin after his colonoscopy   Called and updated his wife. She thinks his cognition has improved. Her only concerns are about his leg edema and his "poor" compliance with medication. However, he seems to be taking his meds about 80-90% of the time after reviewing his chart.

## 2016-07-11 NOTE — Assessment & Plan Note (Signed)
Stable. Recent Hgb 9.2 (b/l) about a week ago.   Nephrology following.

## 2016-07-11 NOTE — Assessment & Plan Note (Addendum)
Stable. Mental status better from when I saw him last. Has had recent carotid doppler which is basically normal.   Continue Plavix and Lipitor  Continue PT

## 2016-07-11 NOTE — Assessment & Plan Note (Addendum)
Stable. Recent BP's in 130's/70's.  Continue current medications (Coreg and Lasix) as above.   Last BMP one week ago notable for some uremia and mild AGMA but no uremic symptoms. sCr improved.   Repeat CMP in a week.

## 2016-07-11 NOTE — Assessment & Plan Note (Addendum)
DP and PT pulses not palpable. Dry scaly skin in his feet bilaterally. Also with 2+ pitting edema in right and 1+ pitting edema in left.  Already on Plavix for CVA  Will consider Aspirin after his colonoscopy

## 2016-07-12 ENCOUNTER — Ambulatory Visit (HOSPITAL_COMMUNITY)
Admission: RE | Admit: 2016-07-12 | Discharge: 2016-07-12 | Disposition: A | Discharge status: Home / Self Care | Payer: Medicare Other | Source: Ambulatory Visit | Attending: Nephrology | Admitting: Nephrology

## 2016-07-12 ENCOUNTER — Encounter: Payer: Self-pay | Admitting: Student

## 2016-07-12 DIAGNOSIS — D631 Anemia in chronic kidney disease: Secondary | ICD-10-CM | POA: Insufficient documentation

## 2016-07-12 DIAGNOSIS — N184 Chronic kidney disease, stage 4 (severe): Secondary | ICD-10-CM | POA: Diagnosis present

## 2016-07-12 DIAGNOSIS — N189 Chronic kidney disease, unspecified: Secondary | ICD-10-CM

## 2016-07-12 LAB — IRON AND TIBC
Iron: 34 ug/dL — ABNORMAL LOW (ref 45–182)
Saturation Ratios: 14 % — ABNORMAL LOW (ref 17.9–39.5)
TIBC: 251 ug/dL (ref 250–450)
UIBC: 217 ug/dL

## 2016-07-12 LAB — POCT HEMOGLOBIN-HEMACUE: HEMOGLOBIN: 10 g/dL — AB (ref 13.0–17.0)

## 2016-07-12 LAB — FERRITIN: Ferritin: 111 ng/mL (ref 24–336)

## 2016-07-12 MED ORDER — DARBEPOETIN ALFA 150 MCG/0.3ML IJ SOSY
150.0000 ug | PREFILLED_SYRINGE | INTRAMUSCULAR | Status: DC
  Administered 2016-07-12: 150 ug via SUBCUTANEOUS

## 2016-07-12 MED ORDER — DARBEPOETIN ALFA 150 MCG/0.3ML IJ SOSY
PREFILLED_SYRINGE | INTRAMUSCULAR | Status: AC
  Administered 2016-07-12: 150 ug via SUBCUTANEOUS
  Filled 2016-07-12: qty 0.3

## 2016-07-25 ENCOUNTER — Other Ambulatory Visit (HOSPITAL_COMMUNITY): Payer: Self-pay | Admitting: *Deleted

## 2016-07-26 ENCOUNTER — Ambulatory Visit (HOSPITAL_COMMUNITY)
Admission: RE | Admit: 2016-07-26 | Discharge: 2016-07-26 | Disposition: A | Discharge status: Home / Self Care | Payer: Medicare Other | Source: Ambulatory Visit | Attending: Nephrology | Admitting: Nephrology

## 2016-07-26 DIAGNOSIS — N184 Chronic kidney disease, stage 4 (severe): Secondary | ICD-10-CM | POA: Diagnosis present

## 2016-07-26 DIAGNOSIS — D631 Anemia in chronic kidney disease: Secondary | ICD-10-CM | POA: Diagnosis not present

## 2016-07-26 MED ORDER — SODIUM CHLORIDE 0.9 % IV SOLN
510.0000 mg | Freq: Once | INTRAVENOUS | Status: AC
  Administered 2016-07-26: 510 mg via INTRAVENOUS
  Filled 2016-07-26: qty 17

## 2016-08-02 ENCOUNTER — Inpatient Hospital Stay (HOSPITAL_COMMUNITY): Admission: RE | Admit: 2016-08-02 | Payer: Medicare Other | Source: Ambulatory Visit

## 2016-08-09 ENCOUNTER — Encounter (HOSPITAL_COMMUNITY)
Admission: RE | Admit: 2016-08-09 | Discharge: 2016-08-09 | Disposition: A | Discharge status: Home / Self Care | Payer: Medicare Other | Source: Ambulatory Visit | Attending: Nephrology | Admitting: Nephrology

## 2016-08-09 DIAGNOSIS — D631 Anemia in chronic kidney disease: Secondary | ICD-10-CM

## 2016-08-09 DIAGNOSIS — N184 Chronic kidney disease, stage 4 (severe): Secondary | ICD-10-CM | POA: Diagnosis present

## 2016-08-09 DIAGNOSIS — N189 Chronic kidney disease, unspecified: Secondary | ICD-10-CM

## 2016-08-09 LAB — POCT HEMOGLOBIN-HEMACUE: HEMOGLOBIN: 12.3 g/dL — AB (ref 13.0–17.0)

## 2016-08-09 LAB — FERRITIN: Ferritin: 242 ng/mL (ref 24–336)

## 2016-08-09 LAB — IRON AND TIBC
Iron: 40 ug/dL — ABNORMAL LOW (ref 45–182)
Saturation Ratios: 16 % — ABNORMAL LOW (ref 17.9–39.5)
TIBC: 251 ug/dL (ref 250–450)
UIBC: 211 ug/dL

## 2016-08-09 MED ORDER — DARBEPOETIN ALFA 150 MCG/0.3ML IJ SOSY: 150.0000 ug | PREFILLED_SYRINGE | INTRAMUSCULAR | Status: DC

## 2016-08-11 ENCOUNTER — Encounter: Payer: Self-pay | Admitting: Student

## 2016-08-11 LAB — BASIC METABOLIC PANEL WITH GFR
Anion Gap: 21 mmol/L (ref ?–30)
BUN: 62 mg/dL — AB (ref 4–21)
CHLORIDE: 105 mmol/L (ref 98–107)
CO2: 20 — AB (ref 22–31)
Calcium: 8 mg/dL — AB (ref 8.6–10.2)
Creatinine: 3.88 — AB (ref 0.5–1.2)
EGFR (African American): 17.06
EGFR (Non-African Amer.): <15
Glucose: 115 mg/dL — AB (ref 70–99)
MAGNESIUM: 1.9 mg/dL (ref 1.6–2.4)
Osmolality: 299.6 — AB (ref 285–295)
PHOSPHORUS: 4.6 mg/dL (ref 2.5–4.9)
Potassium: 4.9 mmol/L (ref 3.3–5.1)
Sodium: 141 (ref 136–145)

## 2016-08-11 LAB — CBC WITH DIFFERENTIAL
BASOS ABS: 0 (ref 0.0–0.2)
Basophils, %: 1 (ref 0.2–2.0)
EOS ABS: 0.1 (ref 0.0–0.7)
EOS PCT: 1.8 (ref 0.0–7.0)
HCT: 37 % — AB (ref 42–52)
HEMOGLOBIN: 11.2 — AB (ref 14.0–18.0)
Lymphocyte #: 1.5 (ref 0.6–3.4)
Lymphocyte %: 34.2 (ref 10.0–50.0)
MCH: 30.2 (ref 26.0–32.0)
MCHC: 30.6 — AB (ref 31.0–36.0)
MCV: 98.7 — AB (ref 80.0–95.0)
MONO ABS: 0.5 (ref 0.0–0.9)
MPV: 8.4 fL (ref 7.5–11.5)
Monocyte %: 11
NEUTROS ABS: 2.2 (ref 2.0–6.9)
NEUTROS PCT: 52.1 (ref 37.0–80.0)
Platelet: 97 — AB (ref 130–440)
RBC: 3.72 — AB (ref 4.7–6.1)
RDW: 15.6 (ref 11.5–16.0)
WBC: 4.3 (ref 4.1–10.9)

## 2016-08-11 NOTE — Progress Notes (Signed)
CBC with differential, BMP, magnesium and phosphorus results abstracted into Epic.

## 2016-08-22 ENCOUNTER — Encounter: Payer: Self-pay | Admitting: Pharmacist

## 2016-08-23 ENCOUNTER — Encounter (HOSPITAL_COMMUNITY)
Admission: RE | Admit: 2016-08-23 | Discharge: 2016-08-23 | Disposition: A | Discharge status: Home / Self Care | Payer: Medicare Other | Source: Ambulatory Visit | Attending: Nephrology | Admitting: Nephrology

## 2016-08-23 DIAGNOSIS — N184 Chronic kidney disease, stage 4 (severe): Secondary | ICD-10-CM | POA: Diagnosis not present

## 2016-08-23 DIAGNOSIS — D631 Anemia in chronic kidney disease: Secondary | ICD-10-CM | POA: Diagnosis present

## 2016-08-23 DIAGNOSIS — N189 Chronic kidney disease, unspecified: Secondary | ICD-10-CM

## 2016-08-23 LAB — POCT HEMOGLOBIN-HEMACUE: Hemoglobin: 12.2 g/dL — ABNORMAL LOW (ref 13.0–17.0)

## 2016-08-23 MED ORDER — DARBEPOETIN ALFA 100 MCG/0.5ML IJ SOSY: 100.0000 ug | PREFILLED_SYRINGE | INTRAMUSCULAR | Status: DC

## 2016-08-23 MED ORDER — DARBEPOETIN ALFA 100 MCG/0.5ML IJ SOSY
PREFILLED_SYRINGE | INTRAMUSCULAR | Status: AC
  Filled 2016-08-23: qty 0.5

## 2016-09-03 LAB — BASIC METABOLIC PANEL
BUN: 56 — AB (ref 4–21)
CREATININE: 3.6 — AB (ref 0.6–1.3)
Glucose: 138
Potassium: 4.8 (ref 3.4–5.3)
SODIUM: 141 (ref 137–147)

## 2016-09-03 LAB — HEPATIC FUNCTION PANEL
ALK PHOS: 122 (ref 25–125)
ALT: 11 (ref 10–40)
AST: 13 — AB (ref 14–40)
Bilirubin, Total: 0.2

## 2016-09-04 ENCOUNTER — Encounter: Payer: Self-pay | Admitting: Family Medicine

## 2016-09-04 NOTE — Progress Notes (Signed)
Dr Jamal Maes [facility]) increased carvedilol to 25 mg BID on 09/01/16.

## 2016-09-05 ENCOUNTER — Encounter: Payer: Self-pay | Admitting: Student

## 2016-09-05 LAB — RENAL FUNCTION PANEL
Albumin: 3.4 — AB (ref 3.5–5.2)
BUN: 70 — AB (ref 4–21)
CALCIUM: 8.1 — AB (ref 8.7–10.7)
CHLORIDE: 105 (ref 99–108)
Carbon Dioxide, Total: 23 (ref 22–31)
Creatinine: 3.1 — AB (ref 0.6–1.3)
Phosphorus: 4.3 (ref 2.5–4.9)
Potassium: 4.6 (ref 3.4–5.3)
SODIUM: 141 (ref 137–147)

## 2016-09-05 LAB — BASIC METABOLIC PANEL WITH GFR
ALBUMIN: 3.2 — AB (ref 3.5–5.2)
BUN: 66 — AB (ref 4–21)
CHLORIDE: 110 — AB (ref 99–108)
CO2: 23 (ref 22–31)
Calcium: 8.2 — AB (ref 8.7–10.7)
Creatinine: 3.1 — AB (ref 0.6–1.3)
Glucose: 64 — AB (ref 70–99)
POTASSIUM: 4.6 (ref 3.4–5.3)
Phosphorus: 4.5 (ref 2.5–4.9)
Sodium: 147 (ref 137–147)

## 2016-09-05 LAB — VITAMIN B12
FOLIC ACID: 16.9 (ref 3.1–17.5)
Folic Acid: 17.4 (ref 3.1–17.5)
VITAMIN B12: 1018 — AB (ref 211–946)
VITAMIN B12: 936 (ref 211–946)

## 2016-09-05 NOTE — Progress Notes (Signed)
Abstracted BMP, vitamin H36 level and folic acid into Epic.

## 2016-09-20 ENCOUNTER — Encounter (HOSPITAL_COMMUNITY)
Admission: RE | Admit: 2016-09-20 | Discharge: 2016-09-20 | Disposition: A | Discharge status: Home / Self Care | Payer: Medicare Other | Source: Ambulatory Visit | Attending: Nephrology | Admitting: Nephrology

## 2016-09-20 ENCOUNTER — Encounter: Payer: Self-pay | Admitting: Pharmacist

## 2016-09-20 DIAGNOSIS — N184 Chronic kidney disease, stage 4 (severe): Secondary | ICD-10-CM

## 2016-09-20 DIAGNOSIS — N189 Chronic kidney disease, unspecified: Principal | ICD-10-CM

## 2016-09-20 DIAGNOSIS — D631 Anemia in chronic kidney disease: Secondary | ICD-10-CM

## 2016-09-20 LAB — IRON AND TIBC
IRON: 20 ug/dL — AB (ref 45–182)
Saturation Ratios: 9 % — ABNORMAL LOW (ref 17.9–39.5)
TIBC: 216 ug/dL — ABNORMAL LOW (ref 250–450)
UIBC: 196 ug/dL

## 2016-09-20 LAB — FERRITIN: FERRITIN: 182 ng/mL (ref 24–336)

## 2016-09-20 LAB — POCT HEMOGLOBIN-HEMACUE: Hemoglobin: 9.9 g/dL — ABNORMAL LOW (ref 13.0–17.0)

## 2016-09-20 MED ORDER — DARBEPOETIN ALFA 100 MCG/0.5ML IJ SOSY
PREFILLED_SYRINGE | INTRAMUSCULAR | Status: AC
  Administered 2016-09-20: 100 ug via SUBCUTANEOUS
  Filled 2016-09-20: qty 0.5

## 2016-09-20 MED ORDER — DARBEPOETIN ALFA 100 MCG/0.5ML IJ SOSY
100.0000 ug | PREFILLED_SYRINGE | INTRAMUSCULAR | Status: DC
  Administered 2016-09-20: 100 ug via SUBCUTANEOUS

## 2016-09-23 ENCOUNTER — Encounter: Payer: Self-pay | Admitting: Family Medicine

## 2016-09-23 DIAGNOSIS — N189 Chronic kidney disease, unspecified: Secondary | ICD-10-CM

## 2016-09-23 HISTORY — DX: Chronic kidney disease, unspecified: N18.9

## 2016-09-23 LAB — PTH, INTACT: PTH, Intact: 200

## 2016-10-01 ENCOUNTER — Other Ambulatory Visit: Payer: Self-pay | Admitting: Internal Medicine

## 2016-10-01 LAB — HEMOGLOBIN A1C: Hemoglobin A1C: 7.5

## 2016-10-03 ENCOUNTER — Encounter: Payer: Self-pay | Admitting: Student

## 2016-10-03 ENCOUNTER — Other Ambulatory Visit (HOSPITAL_COMMUNITY): Payer: Self-pay | Admitting: *Deleted

## 2016-10-03 LAB — CMP14+EGFR
ALK PHOS: 122 (ref 25–125)
ALT: 11 (ref 10–40)
ANION GAP: 17 (ref ?–30)
Albumin/Globulin Ratio: 1.1
Albumin: 3.3 — AB (ref 3.5–5.2)
Aster: 13 (ref 0–40)
BUN / CREAT RATIO: 15.6 (ref 6.0–25.0)
BUN: 56 — AB (ref 4–21)
CALCIUM: 7.9 — AB (ref 8.7–10.7)
CHLORIDE: 106 (ref 99–108)
CREATININE: 3.6 — AB (ref 0.6–1.3)
Carbon Dioxide, Total: 22 (ref 22–31)
GFR CALC AF AMER: 18.54
GLUCOSE: 138 — AB (ref 70–99)
Globulin: 3 (ref 2.0–3.5)
OSMOLALITY MEAS: 298.8 mosm/kg — AB (ref 285–295)
Potassium: 4.8 (ref 3.4–5.3)
Sodium: 141 (ref 137–147)
TOTAL PROTEIN: 6.3 — AB (ref 6.4–8.2)
TOTBILIFLUID: 0.2 (ref 0.2–1.2)

## 2016-10-03 LAB — BASIC METABOLIC PANEL
BUN: 51 — AB (ref 4–21)
CREATININE: 4 — AB (ref 0.6–1.3)
GLUCOSE: 156
POTASSIUM: 5.9 — AB (ref 3.4–5.3)
SODIUM: 139 (ref 137–147)

## 2016-10-03 LAB — HEMOGLOBIN A1C: Hemoglobin A1C: 7.5

## 2016-10-03 LAB — MICROALBUMIN / CREATININE URINE RATIO
CREATININE, URINE: 107 — AB (ref 0.5–1.2)
MICROALB UR: 71
MICROALB/CREAT RATIO: 663 (ref 30.0–30.0)

## 2016-10-03 NOTE — Progress Notes (Signed)
Abstracted his CMP, A1c and urine microalbumin into his chart.

## 2016-10-04 ENCOUNTER — Inpatient Hospital Stay (HOSPITAL_COMMUNITY): Admission: RE | Admit: 2016-10-04 | Payer: Medicare Other | Source: Ambulatory Visit

## 2016-10-04 ENCOUNTER — Encounter: Payer: Self-pay | Admitting: Internal Medicine

## 2016-10-04 NOTE — Progress Notes (Signed)
Documentation:   Patient's RFP resulted and has Cr 4 (from 3.6) and K 5.9. We recently increased Lasix to 80mg  daily (from 91). Will repeat RFP on Monday and if K > 6, will start [place].   Smiley Houseman, MD

## 2016-10-04 NOTE — Progress Notes (Signed)
Late Entry  Geriatrics Rounds 7/11:  Noted weight gain from low to mid 160s to 170lb on 7/9. On exam patient does have 2+ pitting edema to mid shin bilaterally. Increased Lasix from 60mg  daily to 80mg  daily. Ordered Renal Function Panel. Will obtain RFPs regularly to closely monitor kidneys as he is CKD and not a candidate for dialysis.   Smiley Houseman, MD PGY 3 Family Medicine

## 2016-10-08 ENCOUNTER — Encounter: Payer: Self-pay | Admitting: Internal Medicine

## 2016-10-08 ENCOUNTER — Non-Acute Institutional Stay (SKILLED_NURSING_FACILITY): Payer: Medicare Other | Admitting: Internal Medicine

## 2016-10-08 DIAGNOSIS — E039 Hypothyroidism, unspecified: Secondary | ICD-10-CM

## 2016-10-08 DIAGNOSIS — Z860100 Personal history of colon polyps, unspecified: Secondary | ICD-10-CM

## 2016-10-08 DIAGNOSIS — E1151 Type 2 diabetes mellitus with diabetic peripheral angiopathy without gangrene: Secondary | ICD-10-CM

## 2016-10-08 DIAGNOSIS — I10 Essential (primary) hypertension: Secondary | ICD-10-CM

## 2016-10-08 DIAGNOSIS — R4189 Other symptoms and signs involving cognitive functions and awareness: Secondary | ICD-10-CM

## 2016-10-08 DIAGNOSIS — N189 Chronic kidney disease, unspecified: Secondary | ICD-10-CM

## 2016-10-08 NOTE — Assessment & Plan Note (Addendum)
09/25/16 MMSE score 13 out of 30 as per [employer] No behavioral issues reported by staff.

## 2016-10-08 NOTE — Patient Instructions (Signed)
See Current Assessment & Plan in Problem List under specific Diagnosis 

## 2016-10-08 NOTE — Progress Notes (Signed)
NURSING HOME LOCATION:  [facility] ROOM NUMBER:  130-A  CODE STATUS:  Full Code  PCP:  Hendricks Limes, MD  [place] [address]   This is a nursing facility follow up of chronic medical diagnoses  Interim medical record and care since last [place] visit was updated with review of diagnostic studies and change in clinical status since last visit were documented.  HPI: This patient has been followed by [facility] but has transferred to [facility] as of 09/22/16. His past medical history is long & complicated with stroke on at least 2 occasions in 2008 & 2016. He has insulin-dependent diabetes, hypertension, dyslipidemia, history of hepatitis C, chronic kidney disease stage IV, chronic combined systolic and diastolic heart failure, and history of depression. Echo 03/2014 revealed ejection fraction 45-50 percent, grade 1 diastolic dysfunction Labs are current. His creatinine is 4, BUN 51, GFR less than 20, and A1c 7.5%. On basal as well as pre-meal insulin his fasting glucoses range from 87-235. Other random glucoses vary from 177 before the evening meal to 427 after lunch. [employer] NP performed  MMSE 09/25/16 , score was 13 out of 30. No behavioral issues reported by staff   He's had a history of polypectomy and is due for repeat colonoscopy. Last colonoscopy was negative for polyps 04/28/2000 by Dr. Everlean Cherry. On 09/20/16 hemoglobin had dropped to 9.9 from prior values range from 10-12 0.3.  He has never smoked or drunk.  Review of systems: He states that the left lower extremity will be asleep in the mornings upon awakening. He denies any other significant neurologic or neuromuscular symptoms. Constitutional: No fever,significant weight change, fatigue  Eyes: No redness, discharge, pain, vision change ENT/mouth: No nasal congestion,  purulent discharge, earache,change in hearing ,sore throat  Cardiovascular: No chest pain, palpitations,paroxysmal  nocturnal dyspnea, claudication, edema  Respiratory: No cough, sputum production,hemoptysis, DOE , significant snoring,apnea  Gastrointestinal: No heartburn,dysphagia,abdominal pain, nausea / vomiting,rectal bleeding, melena,change in bowels Genitourinary: No dysuria,hematuria, pyuria,  incontinence, nocturia Musculoskeletal: No joint stiffness, joint swelling, weakness,pain Dermatologic: No rash, pruritus, change in appearance of skin Neurologic: No dizziness,headache,syncope, seizures Psychiatric: No significant anxiety , depression, insomnia, anorexia Endocrine: No change in hair/skin/ nails, excessive thirst, excessive hunger, excessive urination  Hematologic/lymphatic: No significant bruising, lymphadenopathy,abnormal bleeding Allergy/immunology: No itchy/ watery eyes, significant sneezing, urticaria, angioedema  Physical exam:  Pertinent or positive findings:Note hypotension and bradycardia as recorded. He is asymptomatic  Despite MMSE score 13 of 30 he gave the date as "the week after 09/25/2016" (6 days off). The president was named.  Resting exotropia of the right eye is present. Ptosis is present on the left. He has pattern alopecia. Left nasolabial fold slightly decreased. Left upper anterior incisor is missing, the right upper anterior incisor is slightly hyperpigmented. Abdomen distended but soft. He has tense edema of the lower extremities with slight pitting to 1+. He has jerking and rolling tremor activity of the upper extremities with range of motion testing. Range of motion lower extremities markedly decreased. He exhibits a speech pattern which is somewhat stammering with starts and stops and rhythm irregularity.  General appearance:Adequately nourished; no acute distress , increased work of breathing is present.   Lymphatic: No lymphadenopathy about the head, neck, axilla . Eyes: No conjunctival inflammation or lid edema is present. There is no scleral icterus. Ears:   External ear exam shows no significant lesions or deformities.   Nose:  External nasal examination shows no  deformity or inflammation. Nasal mucosa are pink and moist without lesions ,exudates Oral exam: lips and gums are healthy appearing.There is no oropharyngeal erythema or exudate . Neck:  No thyromegaly, masses, tenderness noted.    Heart:  Normal rate and regular rhythm. S1 and S2 normal without gallop, murmur, click, rub .  Lungs:Chest clear to auscultation without wheezes, rhonchi,rales , rubs. Abdomen:Bowel sounds are normal. Abdomen is soft and nontender with no organomegaly, hernias,masses. GU: deferred  Extremities:  No cyanosis, clubbing  Skin: Warm & dry w/o tenting. No significant lesions or rash.  See summary under each active problem in the Problem List with associated updated therapeutic plan

## 2016-10-08 NOTE — Assessment & Plan Note (Signed)
10/08/16 blood pressure 79/56 appears to be aberration. Other blood pressures have ranged from 113/76-196/91. No change in antihypertensive medications

## 2016-10-08 NOTE — Assessment & Plan Note (Signed)
Fasting and random glucose are markedly variable A1c of 7.5% and collates adequate diabetic control in the context of his multiple comorbidities Critical is to avoid hypoglycemia in the setting of his cerebrovascular disease history

## 2016-10-08 NOTE — Assessment & Plan Note (Addendum)
Hemoglobin 9.9 on 6/29, prior values 10-12.3 Repeat CBC  Contact Dr.McKie as to potentially repeating the colonoscopy

## 2016-10-08 NOTE — Assessment & Plan Note (Signed)
No change L-thyroxine dose, recheck TSH in 3-6 months

## 2016-10-10 LAB — BASIC METABOLIC PANEL
BUN: 51 — AB (ref 4–21)
BUN: 56 — AB (ref 4–21)
CREATININE: 4 — AB (ref 0.6–1.3)
CREATININE: 3.5 — AB (ref 0.6–1.3)
Glucose: 138
Glucose: 156
POTASSIUM: 5.9 — AB (ref 3.4–5.3)
POTASSIUM: 5.4 — AB (ref 3.4–5.3)
SODIUM: 139 (ref 137–147)
Sodium: 141 (ref 137–147)

## 2016-10-10 LAB — CBC AND DIFFERENTIAL
HCT: 32 — AB (ref 41–53)
HEMOGLOBIN: 10.2 — AB (ref 13.5–17.5)
Neutrophils Absolute: 4
Platelets: 159 (ref 150–399)
WBC: 6.1

## 2016-10-10 LAB — TSH: TSH: 5.57 (ref 0.41–5.90)

## 2016-10-11 ENCOUNTER — Ambulatory Visit (HOSPITAL_COMMUNITY)
Admission: RE | Admit: 2016-10-11 | Discharge: 2016-10-11 | Disposition: A | Discharge status: Home / Self Care | Payer: Medicare Other | Source: Ambulatory Visit | Attending: Nephrology | Admitting: Nephrology

## 2016-10-11 DIAGNOSIS — D631 Anemia in chronic kidney disease: Secondary | ICD-10-CM | POA: Diagnosis not present

## 2016-10-11 DIAGNOSIS — N184 Chronic kidney disease, stage 4 (severe): Secondary | ICD-10-CM | POA: Insufficient documentation

## 2016-10-11 MED ORDER — SODIUM CHLORIDE 0.9 % IV SOLN
510.0000 mg | INTRAVENOUS | Status: DC
  Administered 2016-10-11: 13:00:00 510 mg via INTRAVENOUS
  Filled 2016-10-11: qty 17

## 2016-10-15 LAB — BASIC METABOLIC PANEL
BUN: 50 — AB (ref 4–21)
CREATININE: 3.2 — AB (ref 0.6–1.3)
Glucose: 184
Potassium: 5.1 (ref 3.4–5.3)
Sodium: 139 (ref 137–147)

## 2016-10-17 ENCOUNTER — Encounter (HOSPITAL_COMMUNITY): Payer: Medicare Other

## 2016-10-17 ENCOUNTER — Inpatient Hospital Stay (HOSPITAL_COMMUNITY): Admission: RE | Admit: 2016-10-17 | Payer: Medicare Other | Source: Ambulatory Visit

## 2016-10-17 LAB — HM DIABETES EYE EXAM

## 2016-10-18 ENCOUNTER — Encounter (HOSPITAL_COMMUNITY): Payer: Medicare Other

## 2016-10-24 ENCOUNTER — Encounter: Payer: Self-pay | Admitting: Internal Medicine

## 2016-10-25 ENCOUNTER — Ambulatory Visit (HOSPITAL_COMMUNITY)
Admission: RE | Admit: 2016-10-25 | Discharge: 2016-10-25 | Disposition: A | Discharge status: Home / Self Care | Payer: Medicare Other | Source: Ambulatory Visit | Attending: Nephrology | Admitting: Nephrology

## 2016-10-25 DIAGNOSIS — Z79899 Other long term (current) drug therapy: Secondary | ICD-10-CM | POA: Insufficient documentation

## 2016-10-25 DIAGNOSIS — N189 Chronic kidney disease, unspecified: Secondary | ICD-10-CM

## 2016-10-25 DIAGNOSIS — N184 Chronic kidney disease, stage 4 (severe): Secondary | ICD-10-CM | POA: Diagnosis present

## 2016-10-25 DIAGNOSIS — D631 Anemia in chronic kidney disease: Secondary | ICD-10-CM | POA: Insufficient documentation

## 2016-10-25 DIAGNOSIS — Z5181 Encounter for therapeutic drug level monitoring: Secondary | ICD-10-CM | POA: Insufficient documentation

## 2016-10-25 LAB — POCT HEMOGLOBIN-HEMACUE: HEMOGLOBIN: 10.3 g/dL — AB (ref 13.0–17.0)

## 2016-10-25 MED ORDER — SODIUM CHLORIDE 0.9 % IV SOLN
510.0000 mg | INTRAVENOUS | Status: AC
  Administered 2016-10-25: 510 mg via INTRAVENOUS
  Filled 2016-10-25: qty 17

## 2016-10-25 MED ORDER — DARBEPOETIN ALFA 100 MCG/0.5ML IJ SOSY: 100.0000 ug | PREFILLED_SYRINGE | INTRAMUSCULAR | Status: DC

## 2016-10-25 MED ORDER — DARBEPOETIN ALFA 100 MCG/0.5ML IJ SOSY
PREFILLED_SYRINGE | INTRAMUSCULAR | Status: AC
  Administered 2016-10-25: 100 ug via SUBCUTANEOUS
  Filled 2016-10-25: qty 0.5

## 2016-11-13 LAB — BASIC METABOLIC PANEL
BUN: 49 — AB (ref 4–21)
Creatinine: 3.5 — AB (ref 0.6–1.3)
Glucose: 159
Potassium: 4.9 (ref 3.4–5.3)
Sodium: 142 (ref 137–147)

## 2016-11-13 LAB — CBC AND DIFFERENTIAL
HEMATOCRIT: 36 — AB (ref 41–53)
HEMOGLOBIN: 11.2 — AB (ref 13.5–17.5)
Neutrophils Absolute: 3
PLATELETS: 115 — AB (ref 150–399)
WBC: 5

## 2016-11-22 ENCOUNTER — Encounter (HOSPITAL_COMMUNITY)
Admission: RE | Admit: 2016-11-22 | Discharge: 2016-11-22 | Disposition: A | Discharge status: Home / Self Care | Payer: Medicare Other | Source: Ambulatory Visit | Attending: Nephrology | Admitting: Nephrology

## 2016-11-22 DIAGNOSIS — N184 Chronic kidney disease, stage 4 (severe): Secondary | ICD-10-CM

## 2016-11-22 DIAGNOSIS — D631 Anemia in chronic kidney disease: Secondary | ICD-10-CM | POA: Diagnosis present

## 2016-11-22 DIAGNOSIS — N189 Chronic kidney disease, unspecified: Secondary | ICD-10-CM

## 2016-11-22 LAB — IRON AND TIBC
Iron: 58 ug/dL (ref 45–182)
Saturation Ratios: 27 % (ref 17.9–39.5)
TIBC: 216 ug/dL — ABNORMAL LOW (ref 250–450)
UIBC: 158 ug/dL

## 2016-11-22 LAB — POCT HEMOGLOBIN-HEMACUE: Hemoglobin: 10.7 g/dL — ABNORMAL LOW (ref 13.0–17.0)

## 2016-11-22 LAB — FERRITIN: FERRITIN: 314 ng/mL (ref 24–336)

## 2016-11-22 MED ORDER — DARBEPOETIN ALFA 100 MCG/0.5ML IJ SOSY
PREFILLED_SYRINGE | INTRAMUSCULAR | Status: AC
  Administered 2016-11-22: 13:00:00 100 ug via SUBCUTANEOUS
  Filled 2016-11-22: qty 0.5

## 2016-11-22 MED ORDER — DARBEPOETIN ALFA 100 MCG/0.5ML IJ SOSY
100.0000 ug | PREFILLED_SYRINGE | INTRAMUSCULAR | Status: DC
  Administered 2016-11-22: 100 ug via SUBCUTANEOUS

## 2016-11-26 ENCOUNTER — Non-Acute Institutional Stay (SKILLED_NURSING_FACILITY): Payer: Medicare Other

## 2016-11-26 ENCOUNTER — Non-Acute Institutional Stay (SKILLED_NURSING_FACILITY): Payer: Medicare Other | Admitting: Internal Medicine

## 2016-11-26 DIAGNOSIS — N184 Chronic kidney disease, stage 4 (severe): Secondary | ICD-10-CM

## 2016-11-26 DIAGNOSIS — I10 Essential (primary) hypertension: Secondary | ICD-10-CM

## 2016-11-26 DIAGNOSIS — Z0000 Encounter for general adult medical examination without abnormal findings: Secondary | ICD-10-CM | POA: Diagnosis not present

## 2016-11-26 DIAGNOSIS — M79672 Pain in left foot: Secondary | ICD-10-CM

## 2016-11-26 DIAGNOSIS — E1151 Type 2 diabetes mellitus with diabetic peripheral angiopathy without gangrene: Secondary | ICD-10-CM

## 2016-11-26 NOTE — Progress Notes (Signed)
Subjective:   [name] is a 71 y.o. male who presents for an Initial Medicare Annual Wellness Visit at [facility]      Objective:    Today's Vitals   11/26/16 1513 11/26/16 1514  BP: (!) 160/90   Pulse: 88   Temp: 98 F (36.7 C)   TempSrc: Oral   SpO2: 99%   Weight: 165 lb (74.8 kg)   Height: 5\' 5"  (1.651 m)   PainSc:  2    Body mass index is 27.46 kg/m.  Current Medications (verified) Outpatient Encounter Prescriptions as of 11/26/2016  Medication Sig  . acetaminophen (TYLENOL) 325 MG tablet Take 650 mg by mouth every 6 (six) hours as needed.  Marland Kitchen atorvastatin (LIPITOR) 80 MG tablet Take 1 tablet (80 mg total) by mouth daily.  . bimatoprost (LUMIGAN) 0.01 % SOLN Place 1 drop into the right eye at bedtime.  . calcitRIOL (ROCALTROL) 0.25 MCG capsule Take 0.25 mcg by mouth daily.   . Calcium Citrate 200 MG TABS Take 1 tablet by mouth 2 (two) times daily.  . carvedilol (COREG) 25 MG tablet Take 25 mg by mouth 2 (two) times daily with a meal.  . Cholecalciferol (VITAMIN D3) 50000 units TABS Take 50,000 Units by mouth every 30 (thirty) days.  . clopidogrel (PLAVIX) 75 MG tablet Take 1 tablet (75 mg total) by mouth daily.  . furosemide (LASIX) 80 MG tablet Take 80 mg by mouth daily.  Marland Kitchen gabapentin (NEURONTIN) 100 MG capsule Take 100 mg by mouth 3 (three) times daily.   Marland Kitchen glucagon (GLUCAGON EMERGENCY) 1 MG injection Inject 1 mg into the vein once as needed (hypoglycemia).   . insulin glargine (LANTUS) 100 UNIT/ML injection Inject 10 Units into the skin daily.  . insulin lispro (HUMALOG KWIKPEN) 100 UNIT/ML KiwkPen 6 units [place] before breakfast, 5 units [place] before lunch, 4 units [place] before supper  . ketorolac (ACULAR) 0.4 % SOLN Place 1 drop into the left eye 4 (four) times daily.  Marland Kitchen levothyroxine (SYNTHROID, LEVOTHROID) 100 MCG tablet Take 100 mcg by mouth daily before breakfast.   . linagliptin (TRADJENTA) 5 MG TABS tablet Take 5 mg by mouth daily.  . Menthol,  Topical Analgesic, 4 % GEL Apply 1 application topically 3 (three) times daily as needed.  Marland Kitchen OVER THE COUNTER MEDICATION Take 1 scoop by mouth 2 (two) times daily. Protein powder mixed with food/drink (25 g of protein per serving) PURE PROTEIN  . oxyCODONE (OXY IR/ROXICODONE) 5 MG immediate release tablet Take 5 mg by mouth every 8 (eight) hours as needed for moderate pain (heel pain).   . polyethylene glycol (MIRALAX / GLYCOLAX) packet Take 17 g by mouth 2 (two) times daily.  . prednisoLONE acetate (PRED FORTE) 1 % ophthalmic suspension Place 1 drop into the left eye 4 (four) times daily.  Marland Kitchen PRESCRIPTION MEDICATION Take 1 each by mouth 2 (two) times daily. Magic cup. Twice daily  . sennosides-docusate sodium (SENOKOT-S) 8.6-50 MG tablet Take 2 tablets by mouth 2 (two) times daily.   . sodium bicarbonate 650 MG tablet Take 650 mg by mouth 2 (two) times daily.  . tamsulosin (FLOMAX) 0.4 MG CAPS capsule Take 0.8 mg by mouth at bedtime.    No facility-administered encounter medications on file as of 11/26/2016.     Allergies (verified) Patient has no known allergies.   History: Past Medical History:  Diagnosis Date  . AKI (acute kidney injury) ([place])   . Altered mental status 08/31/2015  .  Bedridden    transfers to chair  . Cerebrovascular disease    a. 01/2007 MRI/A: L MCA 05-69%, R MCA 79%, LICA 48%.  . Chronic combined systolic and diastolic CHF, NYHA class 2 ([place])    a. 01/2007 Echo: EF 60-65%;  b. 03/2014 Echo: EF 45-50%, Gr 1 DD, severe LVH.  Marland Kitchen Chronic kidney disease (CKD), stage IV (severe) ([place]) 05/27/2014  . Chronic kidney disease with active medical management without dialysis, unspecified stage 09/23/2016  . Depression   . GERD (gastroesophageal reflux disease)   . Glaucoma   . Headache   . Heme positive stool 06/18/2016  . Hepatitis dx'd 03/2014   C  . Hepatitis C infection 01/04/2015   - Pt with HCV (+) antibody and HCV RNA Quant Log = 5.87 (H) (04/10/14)  . History of  cerebrovascular accident with residual effects    a. 01/2007 MRI/A: L MCA 01-65%, R MCA 53%, LICA 74%.   Marland Kitchen Hypercholesterolemia   . Hypertension   . Hypoglycemia 02/06/2015  . Hypothyroidism   . Pneumonia 10/2006  . Stroke ([place]) 01/2007   L corona radiata infarct; "left his right side weak" (09/16/2014)  . Stroke [facility]) 03/2014   "while in hospital; left side extremely weak since" (09/16/2014)  . Type II diabetes mellitus ([place])    Type II   Past Surgical History:  Procedure Laterality Date  . CATARACT EXTRACTION EXTRACAPSULAR Left 12/21/2014   Procedure: CATARACT EXTRACTION EXTRACAPSULAR WITH INTRAOCULAR LENS PLACEMENT LEFT EYE;  Surgeon: [name], MD;  Location: [facility];  Service: Ophthalmology;  Laterality: Left;  . COLON SURGERY     Polyps removed- surgically  . COLONOSCOPY  2015  . COLONOSCOPY W/ BIOPSIES AND POLYPECTOMY  2002  . TRABECULECTOMY Left 08/18/2013   Procedure: TRABECULECTOMY WITH TUBE WITH [facility];  Surgeon: [name], MD;  Location: [facility];  Service: Ophthalmology;  Laterality: Left;   Family History  Problem Relation Age of Onset  . Hypertension Father    Social History   Occupational History  . Hospital Administrator     in [place] Topics  . Smoking status: Never Smoker  . Smokeless tobacco: Never Used  . Alcohol use No  . Drug use: No  . Sexual activity: Yes   Tobacco Counseling Counseling given: Not Answered   Activities of Daily Living In your present state of health, do you have any difficulty performing the following activities: 11/22/2016 10/11/2016  Hearing? N N  Vision? N N  Difficulty concentrating or making decisions? N N  Walking or climbing stairs? Y N  Dressing or bathing? N N  Doing errands, shopping? - -  Some recent data might be hidden    Immunizations and Health Maintenance Immunization History  Administered Date(s) Administered  . Influenza-Unspecified 01/04/2015, 12/22/2015  . PPD Test 09/01/2015    . Pneumococcal Conjugate-13 09/27/2016  . Pneumococcal-Unspecified 03/25/2014   Health Maintenance Due  Topic Date Due  . COLONOSCOPY  04/28/2010  . INFLUENZA VACCINE  10/23/2016    Patient Care Team: Hendricks Limes, MD as PCP - General (Internal Medicine) Nolene Ebbs, MD (Internal Medicine) [name], MD as Consulting Physician (Ophthalmology) Jamal Maes, MD as Consulting Physician (Nephrology) Josue Hector, MD as Consulting Physician (Cardiology)  Indicate any recent Medical Services you may have received from other than [facility] providers in the past year (date may be approximate).    Assessment:   This is a routine wellness examination for [name].  Hearing/Vision screen No exam data present  Dietary issues and exercise activities discussed:    Goals    None     Depression Screen PHQ 2/9 Scores 02/22/2016 02/22/2016 08/23/2015  PHQ - 2 Score 0 0 0    Fall Risk Fall Risk  02/22/2016 02/22/2016 08/23/2015  Falls in the past year? No No No    Cognitive Function:        Screening Tests Health Maintenance  Topic Date Due  . COLONOSCOPY  04/28/2010  . INFLUENZA VACCINE  10/23/2016  . TETANUS/TDAP  10/09/2026 (Originally 05/18/1964)  . HEMOGLOBIN A1C  03/29/2017  . FOOT EXAM  07/11/2017  . URINE MICROALBUMIN  09/27/2017  . OPHTHALMOLOGY EXAM  10/17/2017  . Hepatitis C Screening  Completed  . PNA vac Low Risk Adult  Completed        Plan:    I have personally reviewed and addressed the Medicare Annual Wellness questionnaire and have noted the following in the patient's chart:  A. Medical and social history B. Use of alcohol, tobacco or illicit drugs  C. Current medications and supplements D. Functional ability and status E.  Nutritional status F.  Physical activity G. Advance directives H. List of other physicians I.  Hospitalizations, surgeries, and ER visits in previous 12 months J.  [place] to include hearing, vision,  cognitive, depression L. Referrals and appointments - none  In addition, I have reviewed and discussed with patient certain preventive protocols, quality metrics, and best practice recommendations. A written personalized care plan for preventive services as well as general preventive health recommendations were provided to patient.  See attached scanned questionnaire for additional information.   Signed,   Rich Reining, RN Nurse Health Advisor   Quick Notes   Health Maintenance: TDAP due. Pt will get flu vaccine when nursing home receives it.     Abnormal Screen: see BP; follow up with Nephrology as to ACE-I resumption @ low dose vs Hydralazine     Patient Concerns: none     Nurse Concerns: see above I have personally reviewed the health advisor's clinical note, was available for consultation, and agree with the assessment and plan as written. Hendricks Limes M.D., FACP, [facility]

## 2016-11-26 NOTE — Progress Notes (Signed)
NURSING HOME LOCATION:  [facility] ROOM NUMBER:  [id number]  CODE STATUS:  Full Code  PCP:  Hendricks Limes, MD  [place] [address]  This is a nursing facility follow up of chronic medical diagnoses  Interim medical record and care since last [place] visit was updated with review of diagnostic studies and change in clinical status since last visit were documented.  HPI: He has exhibited intermittent hypertension Fasting glucoses range from 119 -150 indicating good control as did the A1c of 7.5% approximately 8 weeks ago. The lunch time glucoses range from 183 up to 222. The glucoses around the evening meal vary from 156 up to 268. Renal function has worsened with creatinine 3.5. He has an associated anemia which is basically stable. The last nephrology note was 6/11; stage IV severe kidney disease was diagnosed. He validates he was last seen several months ago by nephrology. He also restates he does not plan dialysis. Bactrim 80 mg every 12 hours for 7 days was initiated 11/14/16 for urinalysis revealing too numerous to count white cells, 30-50 red cells and 2+ bacteria. The culture actually revealed 75,000 colonies of non-lactose fermenting gram-negative rods with Proteus mirabilis isolate.  Review of systems: He denies any GI, GU or endocrinologic symptoms at this time. His main complaint is pain in the left heel and all 5 left toes. He states this has been present for one year without associated  injury or predisposition. Describes it as worse with weightbearing. As the pain exacerbates he will go to bed with some decrease in discomfort.  Constitutional: No fever,significant weight change, fatigue  Eyes: No redness, discharge, pain, vision change ENT/mouth: No nasal congestion,  purulent discharge, earache,change in hearing ,sore throat  Cardiovascular: No chest pain, palpitations,paroxysmal nocturnal dyspnea, claudication, edema  Respiratory: No cough,  sputum production,hemoptysis, DOE , significant snoring,apnea  Gastrointestinal: No heartburn,dysphagia,abdominal pain, nausea / vomiting,rectal bleeding, melena,change in bowels Genitourinary: No dysuria,hematuria, pyuria,  incontinence, nocturia Dermatologic: No rash, pruritus, change in appearance of skin Neurologic: No dizziness,headache,syncope, seizures, numbness , tingling Psychiatric: No significant anxiety , depression, insomnia, anorexia Endocrine: No change in hair/skin/ nails, excessive thirst, excessive hunger, excessive urination  Hematologic/lymphatic: No significant bruising, lymphadenopathy,abnormal bleeding  Physical exam:  Pertinent or positive findings: Pattern baldness is present; he has ptosis on the left. He's has scattered missing teeth. He has dental plaque as well as scattered dental caries. 1+ edema of the extremities is present. Homans sign is negative. He has a small subcutaneous deficit of the right shin . There is scarring over the left shin. The toenails are thick and dark. He describes pain with compression of the toes. He also describes pain with percussion of the heel. Replies are slightly slow but he was oriented except for the day of the month  General appearance:Adequately nourished; no acute distress , increased work of breathing is present.   Lymphatic: No lymphadenopathy about the head, neck, axilla . Eyes: No conjunctival inflammation or lid edema is present. There is no scleral icterus. Ears:  External ear exam shows no significant lesions or deformities.   Nose:  External nasal examination shows no deformity or inflammation. Nasal mucosa are pink and moist without lesions ,exudates Oral exam: lips and gums are healthy appearing.There is no oropharyngeal erythema or exudate . Neck:  No thyromegaly, masses, tenderness noted.    Heart:  Normal rate and regular rhythm. S1 and S2 normal without gallop, murmur, click, rub .  Lungs:  without wheezes,  rhonchi,rales , rubs. Abdomen:Bowel sounds are normal. Abdomen is soft and nontender with no organomegaly, hernias,masses. GU: deferred  Extremities:  No cyanosis, clubbing  Neurologic exam : Balance,Rhomberg,finger to nose testing could not be completed due to clinical state Skin: Warm & dry w/o tenting. No significant lesions or rash.  See summary under each active problem in the Problem List with associated updated therapeutic plan New issue which has not been evaluated is actually chronic pain in the left toes and heel.  Plan: Uric acid and imaging will be ordered

## 2016-11-26 NOTE — Progress Notes (Signed)
    This is a nursing facility follow up of chronic medical diagnoses  Interim medical record and care since last [place] visit was updated with review of diagnostic studies and change in clinical status since last visit were documented.  HPI: Fasting glucose range from 119 up to 165. The glucoses at lunchtime range from 161 up to 222. Glucoses around the evening meal range 156 up to 268. A1c approximately 8 weeks ago was 7.5% which is adequate control based on his multiple comorbidities. Renal insufficiency has progressed, most recent creatinine was 3.5 on 11/13/16. He has an expected associated anemia with recent hemoglobin 10.7.  Review of systems: Dementia invalidated responses. Date given as   Constitutional: No fever,significant weight change, fatigue  Eyes: No redness, discharge, pain, vision change ENT/mouth: No nasal congestion,  purulent discharge, earache,change in hearing ,sore throat  Cardiovascular: No chest pain, palpitations,paroxysmal nocturnal dyspnea, claudication, edema  Respiratory: No cough, sputum production,hemoptysis, DOE , significant snoring,apnea  Gastrointestinal: No heartburn,dysphagia,abdominal pain, nausea / vomiting,rectal bleeding, melena,change in bowels Genitourinary: No dysuria,hematuria, pyuria,  incontinence, nocturia Musculoskeletal: No joint stiffness, joint swelling, weakness,pain Dermatologic: No rash, pruritus, change in appearance of skin Neurologic: No dizziness,headache,syncope, seizures, numbness , tingling Psychiatric: No significant anxiety , depression, insomnia, anorexia Endocrine: No change in hair/skin/ nails, excessive thirst, excessive hunger, excessive urination  Hematologic/lymphatic: No significant bruising, lymphadenopathy,abnormal bleeding Allergy/immunology: No itchy/ watery eyes, significant sneezing, urticaria, angioedema  Physical exam:  Pertinent or positive findings: General appearance:Adequately  nourished; no acute distress , increased work of breathing is present.   Lymphatic: No lymphadenopathy about the head, neck, axilla . Eyes: No conjunctival inflammation or lid edema is present. There is no scleral icterus. Ears:  External ear exam shows no significant lesions or deformities.   Nose:  External nasal examination shows no deformity or inflammation. Nasal mucosa are pink and moist without lesions ,exudates Oral exam: lips and gums are healthy appearing.There is no oropharyngeal erythema or exudate . Neck:  No thyromegaly, masses, tenderness noted.    Heart:  Normal rate and regular rhythm. S1 and S2 normal without gallop, murmur, click, rub .  Lungs:Chest clear to auscultation without wheezes, rhonchi,rales , rubs. Abdomen:Bowel sounds are normal. Abdomen is soft and nontender with no organomegaly, hernias,masses. GU: deferred  Extremities:  No cyanosis, clubbing,edema  Neurologic exam : Cn 2-7 intact Strength equal  in upper & lower extremities Balance,Rhomberg,finger to nose testing could not be completed due to clinical state Deep tendon reflexes are equal Skin: Warm & dry w/o tenting. No significant lesions or rash.  See summary under each active problem in the Problem List with associated updated therapeutic plan

## 2016-11-26 NOTE — Patient Instructions (Signed)
[name] , Thank you for taking time to come for your Medicare Wellness Visit. I appreciate your ongoing commitment to your health goals. Please review the following plan we discussed and let me know if I can assist you in the future.   Screening recommendations/referrals: Colonoscopy excluded long term pt. Recommended yearly ophthalmology/optometry visit for glaucoma screening and checkup Recommended yearly dental visit for hygiene and checkup  Vaccinations: Influenza vaccine due Pneumococcal vaccine up to date Tdap vaccine due Shingles vaccine not in records  Advanced directives: Need a copy for chart  Conditions/risks identified: None  Next appointment: Dr. Linna [name] makes rounds  Preventive Care 28 Years and Older, Male Preventive care refers to lifestyle choices and visits with your health care provider that can promote health and wellness. What does preventive care include?  A yearly physical exam. This is also called an annual well check.  Dental exams once or twice a year.  Routine eye exams. Ask your health care provider how often you should have your eyes checked.  Personal lifestyle choices, including:  Daily care of your teeth and gums.  Regular physical activity.  Eating a healthy diet.  Avoiding tobacco and drug use.  Limiting alcohol use.  Practicing safe sex.  Taking low doses of aspirin every day.  Taking vitamin and mineral supplements as recommended by your health care provider. What happens during an annual well check? The services and screenings done by your health care provider during your annual well check will depend on your age, overall health, lifestyle risk factors, and family history of disease. Counseling  Your health care provider may ask you questions about your:  Alcohol use.  Tobacco use.  Drug use.  Emotional well-being.  Home and relationship well-being.  Sexual activity.  Eating habits.  History of  falls.  Memory and ability to understand (cognition).  Work and work Statistician. Screening  You may have the following tests or measurements:  Height, weight, and BMI.  Blood pressure.  Lipid and cholesterol levels. These may be checked every 5 years, or more frequently if you are over 72 years old.  Skin check.  Lung cancer screening. You may have this screening every year starting at age 17 if you have a 30-pack-year history of smoking and currently smoke or have quit within the past 15 years.  Fecal occult blood test (FOBT) of the stool. You may have this test every year starting at age 33.  Flexible sigmoidoscopy or colonoscopy. You may have a sigmoidoscopy every 5 years or a colonoscopy every 10 years starting at age 21.  Prostate cancer screening. Recommendations will vary depending on your family history and other risks.  Hepatitis C blood test.  Hepatitis B blood test.  Sexually transmitted disease (STD) testing.  Diabetes screening. This is done by checking your blood sugar (glucose) after you have not eaten for a while (fasting). You may have this done every 1-3 years.  Abdominal aortic aneurysm (AAA) screening. You may need this if you are a current or former smoker.  Osteoporosis. You may be screened starting at age 62 if you are at high risk. Talk with your health care provider about your test results, treatment options, and if necessary, the need for more tests. Vaccines  Your health care provider may recommend certain vaccines, such as:  Influenza vaccine. This is recommended every year.  Tetanus, diphtheria, and acellular pertussis (Tdap, Td) vaccine. You may need a Td booster every 10 years.  Zoster vaccine. You may need  this after age 51.  Pneumococcal 13-valent conjugate (PCV13) vaccine. One dose is recommended after age 56.  Pneumococcal polysaccharide (PPSV23) vaccine. One dose is recommended after age 30. Talk to your health care provider about  which screenings and vaccines you need and how often you need them. This information is not intended to replace advice given to you by your health care provider. Make sure you discuss any questions you have with your health care provider. Document Released: 04/07/2015 Document Revised: 11/29/2015 Document Reviewed: 01/10/2015 Elsevier Interactive Patient Education  2017 [employer] Prevention in the Home Falls can cause injuries. They can happen to people of all ages. There are many things you can do to make your home safe and to help prevent falls. What can I do on the outside of my home?  Regularly fix the edges of walkways and driveways and fix any cracks.  Remove anything that might make you trip as you walk through a door, such as a raised step or threshold.  Trim any bushes or trees on the path to your home.  Use bright outdoor lighting.  Clear any walking paths of anything that might make someone trip, such as rocks or tools.  Regularly check to see if handrails are loose or broken. Make sure that both sides of any steps have handrails.  Any raised decks and porches should have guardrails on the edges.  Have any leaves, snow, or ice cleared regularly.  Use sand or salt on walking paths during winter.  Clean up any spills in your garage right away. This includes oil or grease spills. What can I do in the bathroom?  Use night lights.  Install grab bars by the toilet and in the tub and shower. Do not use towel bars as grab bars.  Use non-skid mats or decals in the tub or shower.  If you need to sit down in the shower, use a plastic, non-slip stool.  Keep the floor dry. Clean up any water that spills on the floor as soon as it happens.  Remove soap buildup in the tub or shower regularly.  Attach bath mats securely with double-sided non-slip rug tape.  Do not have throw rugs and other things on the floor that can make you trip. What can I do in the  bedroom?  Use night lights.  Make sure that you have a light by your bed that is easy to reach.  Do not use any sheets or blankets that are too big for your bed. They should not hang down onto the floor.  Have a firm chair that has side arms. You can use this for support while you get dressed.  Do not have throw rugs and other things on the floor that can make you trip. What can I do in the kitchen?  Clean up any spills right away.  Avoid walking on wet floors.  Keep items that you use a lot in easy-to-reach places.  If you need to reach something above you, use a strong step stool that has a grab bar.  Keep electrical cords out of the way.  Do not use floor polish or wax that makes floors slippery. If you must use wax, use non-skid floor wax.  Do not have throw rugs and other things on the floor that can make you trip. What can I do with my stairs?  Do not leave any items on the stairs.  Make sure that there are handrails on both sides of  the stairs and use them. Fix handrails that are broken or loose. Make sure that handrails are as long as the stairways.  Check any carpeting to make sure that it is firmly attached to the stairs. Fix any carpet that is loose or worn.  Avoid having throw rugs at the top or bottom of the stairs. If you do have throw rugs, attach them to the floor with carpet tape.  Make sure that you have a light switch at the top of the stairs and the bottom of the stairs. If you do not have them, ask someone to add them for you. What else can I do to help prevent falls?  Wear shoes that:  Do not have high heels.  Have rubber bottoms.  Are comfortable and fit you well.  Are closed at the toe. Do not wear sandals.  If you use a stepladder:  Make sure that it is fully opened. Do not climb a closed stepladder.  Make sure that both sides of the stepladder are locked into place.  Ask someone to hold it for you, if possible.  Clearly mark and make  sure that you can see:  Any grab bars or handrails.  First and last steps.  Where the edge of each step is.  Use tools that help you move around (mobility aids) if they are needed. These include:  Canes.  Walkers.  Scooters.  Crutches.  Turn on the lights when you go into a dark area. Replace any light bulbs as soon as they burn out.  Set up your furniture so you have a clear path. Avoid moving your furniture around.  If any of your floors are uneven, fix them.  If there are any pets around you, be aware of where they are.  Review your medicines with your doctor. Some medicines can make you feel dizzy. This can increase your chance of falling. Ask your doctor what other things that you can do to help prevent falls. This information is not intended to replace advice given to you by your health care provider. Make sure you discuss any questions you have with your health care provider. Document Released: 01/05/2009 Document Revised: 08/17/2015 Document Reviewed: 04/15/2014 Elsevier Interactive Patient Education  2017 [employer].

## 2016-11-27 NOTE — Assessment & Plan Note (Addendum)
Schedule F/U Nephrology evaluation as to restarting low dose ACE-I vs Hydralazine Decrease Gabapentin to 150 mg once daily

## 2016-11-29 ENCOUNTER — Encounter: Payer: Self-pay | Admitting: Internal Medicine

## 2016-11-29 NOTE — Assessment & Plan Note (Signed)
There is no apparent discordance between glucoses & A1c despite CKD. No change indicated in therapy or A1c goal of < 8% due to co-morbidities Prognosis mainly dependent on CKD progression

## 2016-11-29 NOTE — Patient Instructions (Signed)
See assessment and plan under each diagnosis in the problem list and acutely for this visit 

## 2016-11-29 NOTE — Assessment & Plan Note (Signed)
BP poorly controlled; request Nephrology reassessment as to restarting low dose ACE-I vs Hydralazine

## 2016-11-29 NOTE — Progress Notes (Signed)
Quick Notes     Abnormal Screen: 6 CIT-7     Patient Concerns: none     Nurse Concerns: none

## 2016-12-20 ENCOUNTER — Ambulatory Visit (HOSPITAL_COMMUNITY)
Admission: RE | Admit: 2016-12-20 | Discharge: 2016-12-20 | Disposition: A | Discharge status: Home / Self Care | Payer: Medicare Other | Source: Ambulatory Visit | Attending: Nephrology | Admitting: Nephrology

## 2016-12-20 DIAGNOSIS — D631 Anemia in chronic kidney disease: Secondary | ICD-10-CM | POA: Diagnosis present

## 2016-12-20 DIAGNOSIS — N189 Chronic kidney disease, unspecified: Secondary | ICD-10-CM

## 2016-12-20 DIAGNOSIS — N184 Chronic kidney disease, stage 4 (severe): Secondary | ICD-10-CM | POA: Insufficient documentation

## 2016-12-20 LAB — IRON AND TIBC
IRON: 40 ug/dL — AB (ref 45–182)
SATURATION RATIOS: 18 % (ref 17.9–39.5)
TIBC: 217 ug/dL — AB (ref 250–450)
UIBC: 177 ug/dL

## 2016-12-20 LAB — FERRITIN: FERRITIN: 178 ng/mL (ref 24–336)

## 2016-12-20 LAB — POCT HEMOGLOBIN-HEMACUE: HEMOGLOBIN: 11.3 g/dL — AB (ref 13.0–17.0)

## 2016-12-20 MED ORDER — DARBEPOETIN ALFA 100 MCG/0.5ML IJ SOSY
100.0000 ug | PREFILLED_SYRINGE | INTRAMUSCULAR | Status: DC
  Administered 2016-12-20: 100 ug via SUBCUTANEOUS

## 2016-12-20 MED ORDER — DARBEPOETIN ALFA 100 MCG/0.5ML IJ SOSY
PREFILLED_SYRINGE | INTRAMUSCULAR | Status: AC
  Administered 2016-12-20: 13:00:00 100 ug via SUBCUTANEOUS
  Filled 2016-12-20: qty 0.5

## 2017-01-01 LAB — HEMOGLOBIN A1C: HEMOGLOBIN A1C: 5.6

## 2017-01-17 ENCOUNTER — Ambulatory Visit (HOSPITAL_COMMUNITY)
Admission: RE | Admit: 2017-01-17 | Discharge: 2017-01-17 | Disposition: A | Discharge status: Home / Self Care | Payer: Medicare Other | Source: Ambulatory Visit | Attending: Nephrology | Admitting: Nephrology

## 2017-01-17 DIAGNOSIS — D631 Anemia in chronic kidney disease: Secondary | ICD-10-CM | POA: Diagnosis not present

## 2017-01-17 DIAGNOSIS — N184 Chronic kidney disease, stage 4 (severe): Secondary | ICD-10-CM | POA: Insufficient documentation

## 2017-01-17 DIAGNOSIS — N189 Chronic kidney disease, unspecified: Secondary | ICD-10-CM

## 2017-01-17 LAB — IRON AND TIBC
IRON: 33 ug/dL — AB (ref 45–182)
Saturation Ratios: 17 % — ABNORMAL LOW (ref 17.9–39.5)
TIBC: 197 ug/dL — ABNORMAL LOW (ref 250–450)
UIBC: 164 ug/dL

## 2017-01-17 LAB — FERRITIN: FERRITIN: 152 ng/mL (ref 24–336)

## 2017-01-17 MED ORDER — DARBEPOETIN ALFA 100 MCG/0.5ML IJ SOSY
PREFILLED_SYRINGE | INTRAMUSCULAR | Status: AC
  Administered 2017-01-17: 13:00:00 100 ug via SUBCUTANEOUS
  Filled 2017-01-17: qty 0.5

## 2017-01-17 MED ORDER — DARBEPOETIN ALFA 100 MCG/0.5ML IJ SOSY
100.0000 ug | PREFILLED_SYRINGE | INTRAMUSCULAR | Status: DC
  Administered 2017-01-17: 100 ug via SUBCUTANEOUS

## 2017-01-20 LAB — POCT HEMOGLOBIN-HEMACUE: HEMOGLOBIN: 11.7 g/dL — AB (ref 13.0–17.0)

## 2017-01-23 ENCOUNTER — Encounter: Payer: Self-pay | Admitting: Internal Medicine

## 2017-01-23 NOTE — Progress Notes (Signed)
    NURSING HOME LOCATION:  [facility] ROOM NUMBER:    CODE STATUS:    PCP:   Hendricks Limes, [place] [address]  This is a nursing facility follow up for specific acute issue of  of chronic medical diagnoses  [place] readmission within 30 days  Interim medical record and care since last [place] visit was updated with review of diagnostic studies and change in clinical status since last visit were documented.  HPI:  Review of systems: Dementia invalidated responses. Date given as   Constitutional: No fever,significant weight change, fatigue  Eyes: No redness, discharge, pain, vision change ENT/mouth: No nasal congestion,  purulent discharge, earache,change in hearing ,sore throat  Cardiovascular: No chest pain, palpitations,paroxysmal nocturnal dyspnea, claudication, edema  Respiratory: No cough, sputum production,hemoptysis, DOE , significant snoring,apnea  Gastrointestinal: No heartburn,dysphagia,abdominal pain, nausea / vomiting,rectal bleeding, melena,change in bowels Genitourinary: No dysuria,hematuria, pyuria,  incontinence, nocturia Musculoskeletal: No joint stiffness, joint swelling, weakness,pain Dermatologic: No rash, pruritus, change in appearance of skin Neurologic: No dizziness,headache,syncope, seizures, numbness , tingling Psychiatric: No significant anxiety , depression, insomnia, anorexia Endocrine: No change in hair/skin/ nails, excessive thirst, excessive hunger, excessive urination  Hematologic/lymphatic: No significant bruising, lymphadenopathy,abnormal bleeding Allergy/immunology: No itchy/ watery eyes, significant sneezing, urticaria, angioedema  Physical exam:  Pertinent or positive findings: General appearance:Adequately nourished; no acute distress , increased work of breathing is present.   Lymphatic: No lymphadenopathy about the head, neck, axilla . Eyes: No conjunctival inflammation or lid edema is  present. There is no scleral icterus. Ears:  External ear exam shows no significant lesions or deformities.   Nose:  External nasal examination shows no deformity or inflammation. Nasal mucosa are pink and moist without lesions ,exudates Oral exam: lips and gums are healthy appearing.There is no oropharyngeal erythema or exudate . Neck:  No thyromegaly, masses, tenderness noted.    Heart:  Normal rate and regular rhythm. S1 and S2 normal without gallop, murmur, click, rub .  Lungs:Chest clear to auscultation without wheezes, rhonchi,rales , rubs. Abdomen:Bowel sounds are normal. Abdomen is soft and nontender with no organomegaly, hernias,masses. GU: deferred  Extremities:  No cyanosis, clubbing,edema  Neurologic exam : Cn 2-7 intact Strength equal  in upper & lower extremities Balance,Rhomberg,finger to nose testing could not be completed due to clinical state Deep tendon reflexes are equal Skin: Warm & dry w/o tenting. No significant lesions or rash.  See summary under each active problem in the Problem List with associated updated therapeutic plan     This encounter was created in error - please disregard.

## 2017-01-28 ENCOUNTER — Encounter: Payer: Self-pay | Admitting: Internal Medicine

## 2017-01-28 ENCOUNTER — Non-Acute Institutional Stay (SKILLED_NURSING_FACILITY): Payer: Medicare Other | Admitting: Internal Medicine

## 2017-01-28 DIAGNOSIS — E039 Hypothyroidism, unspecified: Secondary | ICD-10-CM

## 2017-01-28 DIAGNOSIS — E1151 Type 2 diabetes mellitus with diabetic peripheral angiopathy without gangrene: Secondary | ICD-10-CM

## 2017-01-28 DIAGNOSIS — N184 Chronic kidney disease, stage 4 (severe): Secondary | ICD-10-CM

## 2017-01-28 DIAGNOSIS — M79672 Pain in left foot: Secondary | ICD-10-CM

## 2017-01-28 DIAGNOSIS — G8929 Other chronic pain: Secondary | ICD-10-CM

## 2017-01-28 DIAGNOSIS — B182 Chronic viral hepatitis C: Secondary | ICD-10-CM

## 2017-01-28 NOTE — Assessment & Plan Note (Addendum)
01/28/17 discordance between reported glucoses and A1c, most likely due to CKD but chronic liver issues may be playing a role

## 2017-01-28 NOTE — Assessment & Plan Note (Signed)
01/28/17 TSH 5.57 on 10/10/16 Will check TSH

## 2017-01-28 NOTE — Progress Notes (Signed)
NURSING HOME LOCATION:  [facility] ROOM NUMBER:  130-A  CODE STATUS:  Full Code  PCP:  Hendricks Limes, MD  [place] [address]  This is a nursing facility follow up of chronic medical diagnoses  Interim medical record and care since last [facility] visit was updated with review of diagnostic studies and change in clinical status since last visit were documented.  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 He was seen by [facility] 01/16/17, Dr. Lorrene Reid. CKD stage IV with heavy proteinuria was felt to be most likely due to his diabetes with associated peripheral vascular disease. Dr. Lorrene Reid discontinued his ACE inhibitor and initiate hydralazine for poorly controlled hypertension. CKD was first evaluated 1/16 when he was hospitalized for a stroke. Because of his multiple comorbidities including strokes with  limited physical cognitive issues , other conditions noted above and permanent SNF placement; he was not appropriate candidate for dialysis. He definitively stated he never wanted dialysis because the time commitment and logistics involved. He simply wanted comfort measures only.  He is on high-dose statin because of his history of stroke. His most recent liver function tests were normal despite a history of chronic hepatitis C, status post treatment. His anemia has been stable with hemoglobin 11.7.Hemoglobin was 11.3 on 10/3. Iron is low at 33 but ferritin is normal.The patient is on Aranesp 100 mg every 4 weeks.  Random glucoses have been in the 140-180 range. The lowest reported value is 104 & the high is 334. The isolated highs are typically  around the lunch and evening meals and are outliers. A1c is 5.6. This discordance is due to his CKD with possible contribution of his hepatic disease. TSH was high normal at 5.57 on 10/10/16.Serially it has been risinmg  Review of systems: His major complaint is pain in the left foot when he awakens in the morning. Initially he seemed to be indicating his right foot as if denying the paralyzed left lower extremity. When last seen September 4 he was complaining of pain in the foot as well, mainly in the heel and toes. He also specifically denies any clay colored stool or Coca-Cola colored urine. He also denies any symptoms related to the diabetes.  Constitutional: No fever,significant weight change, fatigue  Eyes: No redness, discharge, pain, vision change ENT/mouth: No nasal congestion,  purulent discharge, earache,change in hearing ,sore throat  Cardiovascular: No chest pain, palpitations,paroxysmal nocturnal dyspnea, claudication, edema  Respiratory: No cough, sputum production,hemoptysis, DOE , significant snoring,apnea  Gastrointestinal: No heartburn,dysphagia,abdominal pain, nausea / vomiting,rectal bleeding, melena,change in bowels Genitourinary: No dysuria,hematuria, pyuria,  incontinence, nocturia Dermatologic: No rash, pruritus, change in appearance of skin Neurologic: No dizziness,headache,syncope, seizures, numbness , tingling Psychiatric: No significant anxiety , depression, insomnia, anorexia Endocrine: No change in hair/skin/ nails, excessive thirst, excessive hunger, excessive urination  Hematologic/lymphatic: No significant bruising, lymphadenopathy,abnormal bleeding Allergy/immunology: No itchy/ watery eyes, significant sneezing, urticaria, angioedema  Physical exam:  Pertinent or positive findings: Pattern alopecia is present. He has resting exotropia on the right. Heart sounds are heard best in the epigastrium. Breath sounds are decreased with minimal scattered rales. Pedal  pulses are decreased. He has 1+ pedal edema. With ROM testing he exhibits athetoid movements of the upper extremities with elevation.  Strength is surprisingly good in the upper and right lower extremity. There is minimal range of motion the left lower extremity which remains flexed @ the knee.  General appearance:Adequately nourished; no acute distress , increased work of breathing is present.   Lymphatic: No lymphadenopathy about the head, neck, axilla . Eyes: No conjunctival inflammation or lid edema is present. There is no scleral icterus. Ears:  External ear exam shows no significant lesions or deformities.   Nose:  External nasal examination shows no deformity or inflammation. Nasal mucosa are pink and moist without lesions ,exudates Oral exam: lips and gums are healthy appearing.There is no oropharyngeal erythema or exudate . Neck:  No thyromegaly, masses, tenderness noted.    Heart:  Normal rate and regular rhythm. S1 and S2 normal without gallop, murmur, click, rub .  Lungs: without wheezes, rhonchi, rubs. Abdomen:Bowel sounds are normal. Abdomen is soft and nontender with no organomegaly, hernias,masses. GU: deferred  Extremities:  No cyanosis, clubbing  Skin: Warm & dry w/o tenting. No significant lesions or rash.  See summary under each active problem in the Problem List with associated updated therapeutic plan

## 2017-01-28 NOTE — Assessment & Plan Note (Signed)
On high-dose statin, last LFTs were normal in June 2018.

## 2017-01-28 NOTE — Assessment & Plan Note (Signed)
Imaging PT/OT assessment, ? brace trial

## 2017-01-28 NOTE — Assessment & Plan Note (Signed)
Monitor blood pressure on hydralazine in lieu of ACE inhibitor

## 2017-01-29 ENCOUNTER — Encounter: Payer: Self-pay | Admitting: Internal Medicine

## 2017-01-29 NOTE — Patient Instructions (Signed)
See assessment and plan under each diagnosis in the problem list and acutely for this visit 

## 2017-02-10 ENCOUNTER — Ambulatory Visit: Payer: Medicare Other | Admitting: Infectious Diseases

## 2017-02-14 ENCOUNTER — Ambulatory Visit (HOSPITAL_COMMUNITY)
Admission: RE | Admit: 2017-02-14 | Discharge: 2017-02-14 | Disposition: A | Discharge status: Home / Self Care | Payer: Medicare Other | Source: Ambulatory Visit | Attending: Nephrology | Admitting: Nephrology

## 2017-02-14 VITALS — BP 161/78 | HR 75 | Temp 98.3°F | Resp 20

## 2017-02-14 DIAGNOSIS — D631 Anemia in chronic kidney disease: Secondary | ICD-10-CM | POA: Diagnosis not present

## 2017-02-14 DIAGNOSIS — N184 Chronic kidney disease, stage 4 (severe): Secondary | ICD-10-CM | POA: Insufficient documentation

## 2017-02-14 DIAGNOSIS — N189 Chronic kidney disease, unspecified: Secondary | ICD-10-CM

## 2017-02-14 LAB — IRON AND TIBC
Iron: 42 ug/dL — ABNORMAL LOW (ref 45–182)
SATURATION RATIOS: 19 % (ref 17.9–39.5)
TIBC: 217 ug/dL — ABNORMAL LOW (ref 250–450)
UIBC: 175 ug/dL

## 2017-02-14 LAB — FERRITIN: Ferritin: 90 ng/mL (ref 24–336)

## 2017-02-14 MED ORDER — DARBEPOETIN ALFA 100 MCG/0.5ML IJ SOSY
PREFILLED_SYRINGE | INTRAMUSCULAR | Status: AC
  Administered 2017-02-14: 100 ug via SUBCUTANEOUS
  Filled 2017-02-14: qty 0.5

## 2017-02-14 MED ORDER — DARBEPOETIN ALFA 100 MCG/0.5ML IJ SOSY
100.0000 ug | PREFILLED_SYRINGE | INTRAMUSCULAR | Status: DC
  Administered 2017-02-14: 100 ug via SUBCUTANEOUS

## 2017-02-17 LAB — POCT HEMOGLOBIN-HEMACUE: Hemoglobin: 10.8 g/dL — ABNORMAL LOW (ref 13.0–17.0)

## 2017-03-06 ENCOUNTER — Ambulatory Visit: Payer: Medicare Other | Admitting: Infectious Diseases

## 2017-03-10 LAB — CBC AND DIFFERENTIAL
HEMATOCRIT: 33 — AB (ref 41–53)
Hemoglobin: 10.2 — AB (ref 13.5–17.5)
NEUTROS ABS: 2
Platelets: 137 — AB (ref 150–399)
WBC: 4.6

## 2017-03-10 LAB — LIPID PANEL
Cholesterol: 91 (ref 0–200)
HDL: 34 — AB (ref 35–70)
LDL CALC: 45
Triglycerides: 61 (ref 40–160)

## 2017-03-10 LAB — BASIC METABOLIC PANEL
BUN: 68 — AB (ref 4–21)
Creatinine: 4 — AB (ref 0.6–1.3)
Glucose: 108
Potassium: 5.3 (ref 3.4–5.3)
SODIUM: 142 (ref 137–147)

## 2017-03-10 LAB — HEPATIC FUNCTION PANEL
ALT: 10 (ref 10–40)
AST: 11 — AB (ref 14–40)
Alkaline Phosphatase: 102 (ref 25–125)
BILIRUBIN, TOTAL: 0.2

## 2017-03-10 LAB — TSH: TSH: 2.47 (ref 0.41–5.90)

## 2017-03-10 LAB — VITAMIN D 25 HYDROXY (VIT D DEFICIENCY, FRACTURES): VIT D 25 HYDROXY: 16.37

## 2017-03-10 LAB — VITAMIN B12: Vitamin B-12: 831

## 2017-03-10 LAB — HEMOGLOBIN A1C: Hemoglobin A1C: 6.7

## 2017-03-11 ENCOUNTER — Ambulatory Visit (INDEPENDENT_AMBULATORY_CARE_PROVIDER_SITE_OTHER): Payer: Medicare Other | Admitting: Infectious Diseases

## 2017-03-11 ENCOUNTER — Encounter: Payer: Self-pay | Admitting: Infectious Diseases

## 2017-03-11 DIAGNOSIS — R8271 Bacteriuria: Secondary | ICD-10-CM

## 2017-03-11 NOTE — Patient Instructions (Signed)
No further bactrim for UTI - would use either Linezolid 600 mg BID x 5 days MAX (this can cause thrombocytopenia long term) or Fosfomycin 3gm PO QD x 1 dose (up to 3 doses if fails 1x dose).   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   Could consider adding Cranberry capsules to help prevent bacteria adhering to bladder wall - would discuss with urologist / nephrologist.   Encourage frequent bladder emptying and good blood sugar control.   Return to ID clinic as needed if further information needed.

## 2017-03-11 NOTE — Progress Notes (Signed)
[name]"  06-23-1945 [id number] PCP: Hendricks Limes, MD  Referring Provider: Hendricks Limes, MD   Reason for Visit: MRSA in urine  HPI: [name] is a 71 y.o. male that presents today for evaluation of MRSA isolate detected in urine.   [name] is a resident at [facility]. He was treated 10/2016 for UTI per records and completed course of Bactrim. On 11/2016 follow up urine was collected and showed > 100k colony count MRSA (R-cipro, bactrim, oxacillin, levofloxacin, bactrim; no inducible clindamycin resistance); he denied any urinary symptoms, fevers and appeared well at this time but appears to have been treated with 10 day course of Bactrim. Last hospitalized in 08/2015 when he was admitted d/t AMS. Found to have MRSA isolate (R-cipro, oxacillin, bactrim) in urine. UTI vs asymptomatic bacteriuria. In 2016 during hospitalization was also found to have same urine MRSA isolate per chart review.    Today he tells me he feels very well, although he is uncertain as to why he is here. He denies any flank pain, suprapubic/abdominal pain, urinary frequency, dysuria, or retention symptoms. No fevers. He is a [employer] park manager and has a daughter that visits with him locally. Tells me he is very much against the idea of dialysis (CKD-IV and follows with Nephrologist).   Patient Active Problem List   Diagnosis Date Noted  . Asymptomatic bacteriuria, MRSA  03/11/2017  . Chronic foot pain, left 01/28/2017  . Chronic kidney disease with active medical management without dialysis, unspecified stage 09/23/2016  . PAD (peripheral artery disease) ([place]) 07/11/2016  . Rectal bleeding 06/18/2016  . Antiplatelet or antithrombotic long-term use 06/18/2016  . History of colon polyps 06/18/2016  . Hyperparathyroidism ([place]) 11/28/2015  . BPH (benign prostatic hyperplasia) 11/24/2015  . Tinea pedis of both feet 11/24/2015  . 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  Nephrotic syndrome 09/11/2015  . Anemia of renal disease 09/11/2015  . Hypothyroidism 06/27/2015  . Glaucoma 02/01/2015  . Chronic hepatitis C without hepatic coma ([place]) 01/04/2015  . Person living in residential institution 12/28/2014  . HLD (hyperlipidemia) 06/16/2014  . Peripheral vision loss 06/16/2014  . 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 Chronic kidney disease (CKD), stage IV (severe) ([place]) 05/27/2014  . Essential hypertension   . Diabetes mellitus type 2 with peripheral artery disease ([place])   . Congestive dilated cardiomyopathy ([place]) 04/11/2014   Review of Systems  Constitutional: Negative for chills, fever and malaise/fatigue.  HENT: Negative for tinnitus.   Eyes: Negative for blurred vision and photophobia.  Respiratory: Negative for cough and sputum production.   Cardiovascular: Negative for chest pain.  Gastrointestinal: Negative for diarrhea, nausea and vomiting.  Genitourinary: Negative for dysuria.  Musculoskeletal: Positive for back pain. Joint pain: chronic.       Reports he is not ambulatory and stays in wheelchair.   Skin: Negative for rash.  Neurological: Negative for headaches.   Past Medical History:  Diagnosis Date  . AKI (acute kidney injury) ([place])   . Altered mental status 08/31/2015  . Bedridden    transfers to chair  . Cerebrovascular disease    a. 01/2007 MRI/A: L MCA 40-98%, R MCA 11%, LICA 91%.  . Chronic combined systolic and diastolic CHF, NYHA class 2 ([place])    a. 01/2007 Echo: EF 60-65%;  b. 03/2014 Echo: EF 45-50%, Gr 1 DD, severe LVH.  Marland Kitchen Chronic kidney disease (CKD), stage IV (severe) ([place]) 05/27/2014  .  Chronic kidney disease with active medical management without dialysis, unspecified stage 09/23/2016  . Depression   . GERD (gastroesophageal reflux disease)   . Glaucoma   . Headache   . Heme positive stool 06/18/2016  . Hepatitis dx'd 03/2014   C  . Hepatitis C infection 01/04/2015   - Pt with HCV  (+) antibody and HCV RNA Quant Log = 5.87 (H) (04/10/14)  . History of cerebrovascular accident with residual effects    a. 01/2007 MRI/A: L MCA 33-29%, R MCA 51%, LICA 88%.   Marland Kitchen Hypercholesterolemia   . Hypertension   . Hypoglycemia 02/06/2015  . Hypothyroidism   . Pneumonia 10/2006  . Stroke (Normal) 01/2007   L corona radiata infarct; "left his right side weak" (09/16/2014)  . Stroke [facility]) 03/2014   "while in hospital; left side extremely weak since" (09/16/2014)  . Type II diabetes mellitus (H[facility])    Type II   Social History   Tobacco Use  . Smoking status: Never Smoker  . Smokeless tobacco: Never Used  Substance Use Topics  . Alcohol use: No    Alcohol/week: 0.0 oz  . Drug use: No   Family History  Problem Relation Age of Onset  . Hypertension Father     No Known Allergies  OBJECTIVE: Vitals:   03/11/17 0848  BP: 126/80  Pulse: 76  Temp: 98.4 F (36.9 C)  TempSrc: Oral  Weight: 157 lb 3.2 oz (71.3 kg)  Height: 5\' 5"  (1.651 m)   Body mass index is 26.16 kg/m.  Physical Exam  Constitutional: He is oriented to person, place, and time.  Very pleasant AA male in wheelchair. Appears to be feeling well today.   HENT:  Mouth/Throat: Oropharynx is clear and moist.  Eyes: Pupils are equal, round, and reactive to light.  Cardiovascular: Normal rate and regular rhythm.  Murmur heard. Pulmonary/Chest: Effort normal and breath sounds normal.  Abdominal: Soft. Bowel sounds are normal. There is no tenderness.  Neurological: He is alert and oriented to person, place, and time.  Skin: Skin is warm and dry.   Clinical documentation reviewed prior to visit.   ASSESSMENT & PLAN:  Problem List Items Addressed This Visit      Other   Asymptomatic bacteriuria, MRSA     Same MRSA isolate noted in urine since 2015. Isolate is resistant to bactrim, however he has received this at least once recently for treatment of what seems to have been asymptomatic bacteruria.   Would recommend  only checking urine when symptoms present concerning for UTI, and would advise against follow up cultures for "cure" as I have a strong assumption he is likely colonized with MRSA at this point.   If treatment is needed for UTI would recommend against further Bactrim use. Doxycyline is not an effective option for UTI.   Would use either Fosfomycin 3gm packet QD x 1 (up to 3 doses if fails to have improvement) or linezolid 600 mg BID x 5 days maximum. Reviewed with our clinical ID pharmacists and there is data supporting use of linezolid in cystitis. I would strongly limit use to 3 - 5 days however due to side effects of thrombocytopenia with longer term use.   Will defer to urology team if cranberry capsules would be helpful addition to prevent adherence of bacteria to bladder wall as there is data to support use in prevention.   Encourage frequent bladder emptying and good blood sugar control.   He is welcome to follow up here  as needed.         Janene Madeira, MSN, [facility]  03/12/2017  11:55 AM

## 2017-03-12 NOTE — Assessment & Plan Note (Addendum)
Same MRSA isolate noted in urine since 2015. Isolate is resistant to bactrim, however he has received this at least once recently for treatment of what seems to have been asymptomatic bacteruria.   Would recommend only checking urine when symptoms present concerning for UTI, and would advise against follow up cultures for "cure" as I have a strong assumption he is likely colonized with MRSA at this point.   If treatment is needed for UTI would recommend against further Bactrim use. Doxycyline is not an effective option for UTI.   Would use either Fosfomycin 3gm packet QD x 1 (up to 3 doses if fails to have improvement) or linezolid 600 mg BID x 5 days maximum. Reviewed with our clinical ID pharmacists and there is data supporting use of linezolid in cystitis. I would strongly limit use to 3 - 5 days however due to side effects of thrombocytopenia with longer term use.   Will defer to urology team if cranberry capsules would be helpful addition to prevent adherence of bacteria to bladder wall as there is data to support use in prevention.   Encourage frequent bladder emptying and good blood sugar control.   He is welcome to follow up here as needed.

## 2017-03-13 ENCOUNTER — Encounter: Payer: Self-pay | Admitting: Internal Medicine

## 2017-03-13 DIAGNOSIS — Z22322 Carrier or suspected carrier of Methicillin resistant Staphylococcus aureus: Secondary | ICD-10-CM | POA: Insufficient documentation

## 2017-03-14 ENCOUNTER — Ambulatory Visit (HOSPITAL_COMMUNITY)
Admission: RE | Admit: 2017-03-14 | Discharge: 2017-03-14 | Disposition: A | Discharge status: Home / Self Care | Payer: Medicare Other | Source: Ambulatory Visit | Attending: Nephrology | Admitting: Nephrology

## 2017-03-14 VITALS — BP 149/90 | HR 80 | Temp 98.1°F | Resp 20

## 2017-03-14 DIAGNOSIS — Z79899 Other long term (current) drug therapy: Secondary | ICD-10-CM | POA: Diagnosis not present

## 2017-03-14 DIAGNOSIS — D631 Anemia in chronic kidney disease: Secondary | ICD-10-CM

## 2017-03-14 DIAGNOSIS — N184 Chronic kidney disease, stage 4 (severe): Secondary | ICD-10-CM | POA: Diagnosis present

## 2017-03-14 DIAGNOSIS — N189 Chronic kidney disease, unspecified: Secondary | ICD-10-CM

## 2017-03-14 DIAGNOSIS — Z5181 Encounter for therapeutic drug level monitoring: Secondary | ICD-10-CM | POA: Insufficient documentation

## 2017-03-14 LAB — FERRITIN: FERRITIN: 91 ng/mL (ref 24–336)

## 2017-03-14 LAB — POCT HEMOGLOBIN-HEMACUE: HEMOGLOBIN: 10.3 g/dL — AB (ref 13.0–17.0)

## 2017-03-14 LAB — IRON AND TIBC
IRON: 32 ug/dL — AB (ref 45–182)
Saturation Ratios: 14 % — ABNORMAL LOW (ref 17.9–39.5)
TIBC: 228 ug/dL — ABNORMAL LOW (ref 250–450)
UIBC: 196 ug/dL

## 2017-03-14 MED ORDER — DARBEPOETIN ALFA 100 MCG/0.5ML IJ SOSY
100.0000 ug | PREFILLED_SYRINGE | INTRAMUSCULAR | Status: DC
  Administered 2017-03-14: 100 ug via SUBCUTANEOUS

## 2017-03-14 MED ORDER — DARBEPOETIN ALFA 100 MCG/0.5ML IJ SOSY
PREFILLED_SYRINGE | INTRAMUSCULAR | Status: AC
  Administered 2017-03-14: 14:00:00 100 ug via SUBCUTANEOUS
  Filled 2017-03-14: qty 0.5

## 2017-04-01 ENCOUNTER — Encounter: Payer: Self-pay | Admitting: Internal Medicine

## 2017-04-01 ENCOUNTER — Non-Acute Institutional Stay (SKILLED_NURSING_FACILITY): Payer: Medicare Other | Admitting: Internal Medicine

## 2017-04-01 DIAGNOSIS — N189 Chronic kidney disease, unspecified: Secondary | ICD-10-CM

## 2017-04-01 DIAGNOSIS — R5383 Other fatigue: Secondary | ICD-10-CM | POA: Diagnosis not present

## 2017-04-01 DIAGNOSIS — Z22322 Carrier or suspected carrier of Methicillin resistant Staphylococcus aureus: Secondary | ICD-10-CM | POA: Diagnosis not present

## 2017-04-01 DIAGNOSIS — M79604 Pain in right leg: Secondary | ICD-10-CM

## 2017-04-01 LAB — CBC AND DIFFERENTIAL
HCT: 34 — AB (ref 41–53)
HEMOGLOBIN: 10.1 — AB (ref 13.5–17.5)
Neutrophils Absolute: 3
PLATELETS: 137 — AB (ref 150–399)
WBC: 5.1

## 2017-04-01 LAB — BASIC METABOLIC PANEL
BUN: 56 — AB (ref 4–21)
CREATININE: 3.9 — AB (ref 0.6–1.3)
Glucose: 155
POTASSIUM: 4.7 (ref 3.4–5.3)
Sodium: 143 (ref 137–147)

## 2017-04-01 NOTE — Assessment & Plan Note (Addendum)
BMET Unfortunately family conflict exists between  [facility] mother's wishes and the adult children's wishes  [facility] Mortal by Dr Eda Keys will be recommended to family

## 2017-04-01 NOTE — Progress Notes (Signed)
This is a nursing facility follow up for specific acute issue of mental status changes.  Interim medical record and care since last [facility] visit was updated with review of diagnostic studies and change in clinical status since last visit were documented.  HPI: Staff reported that the patient was difficult to arouse and poorly responsive. This is in the context of progressive renal insufficiency for which the patient has declined dialysis. On 03/10/17 BUN was 68 & creatinine 4.  His anemia was essentially stable, iron was started because of an iron level 32. Ferritin was normal at 91. AST was slightly reduced with value of 11. He is on statin.Lipids were at goal with an LDL of 45. HDL was reduced to 34.  He was also found to have vitamin D deficiency with a value of 16.37. TSH was therapeutic. He was on no nephrotoxic drugs based on mid review, he was on low-dose gabapentin. Blood pressure has remained elevated requiring multi occasion therapy including hydralazine 50 mg 3 times a day. This is at the upper limits of normal for his present GFR. Glucoses @ the SNF range 107 fasting to 234.A1c was 6.7% on 03/10/17.Discordance expected with ESRD.  Review of systems: His mental state demises ability to take review of systems. He was unable to give me the date. He alternated between describing pain in the right lower versus the left lower extremity. It appears he is having discomfort in the right lower extremity calf area. He said it is worse in the morning but then he states that it's is intermittent. He is vague as to exacerbating factors. He denies any associated cardiopulmonary symptoms.  Constitutional: No fever,significant weight change, fatigue  Eyes: No redness, discharge, pain, vision change ENT/mouth: No nasal congestion,  purulent discharge, earache,change in hearing ,sore throat  Cardiovascular: No chest pain, palpitations,paroxysmal nocturnal dyspnea, claudication,  edema  Respiratory: No cough, sputum production,hemoptysis, DOE , significant snoring,apnea  Gastrointestinal: No heartburn,dysphagia,abdominal pain, nausea / vomiting,rectal bleeding, melena,change in bowels Genitourinary: No dysuria,hematuria, pyuria,  incontinence, nocturia Dermatologic: No rash, pruritus, change in appearance of skin Neurologic: No dizziness,headache,syncope, seizures, numbness , tingling Psychiatric: No significant anxiety , depression, insomnia, anorexia Endocrine: No change in hair/skin/ nails, excessive thirst, excessive hunger, excessive urination  Hematologic/lymphatic: No significant bruising, lymphadenopathy,abnormal bleeding Allergy/immunology: No itchy eyes, significant sneezing, urticaria, angioedema  Physical exam:  Pertinent or positive findings: He is markedly lethargic and slow to respond. Exotropia is present on the right, ptosis present on the left. He has scattered caries. Low-grade rhonchi are noted. Bowel sounds are decreased. [facility] NP stated she found some suprapubic tenderness. 1/2+ edema up to the right knee. There is atrophy of the left calf and the left lower extremity is flexed toward the body. The left upper extremity is flexed across the thorax. He has decreased range of motion in the right lower extremity. Fusiform changes in the right knee are present. Equivocal Bevelyn Buckles is present on the right.  General appearance:Adequately nourished; no acute distress , increased work of breathing is present.   Lymphatic: No lymphadenopathy about the head, neck, axilla . Eyes: No conjunctival inflammation or lid edema is present. There is no scleral icterus. Ears:  External ear exam shows no significant lesions or deformities.   Nose:  External nasal examination shows no deformity or inflammation. Nasal mucosa are pink and moist without lesions ,exudates Oral exam: lips and gums are healthy appearing.There is no oropharyngeal erythema or exudate . Neck:  No  thyromegaly,  masses, tenderness noted.    Heart:  Normal rate and regular rhythm. S1 and S2 normal without gallop, murmur, click, rub .  Lungs: without wheezes,rales , rubs. Abdomen:Bowel sounds are normal. Abdomen is soft with no organomegaly, hernias,masses. GU: deferred  Extremities:  No cyanosis, clubbing  Neurologic exam : Balance,Rhomberg,finger to nose testing could not be completed due to clinical state Skin: Warm & dry w/o tenting. No significant lesions or rash.  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 No CHF or HCAP

## 2017-04-01 NOTE — Patient Instructions (Signed)
See assessment and plan under each diagnosis in the problem list and acutely for this visit 

## 2017-04-01 NOTE — Assessment & Plan Note (Addendum)
04/01/17 clinically there is no definite UTI but low grade fever & suprapubic tenderness warrant C&S

## 2017-04-10 ENCOUNTER — Other Ambulatory Visit (HOSPITAL_COMMUNITY): Payer: Self-pay

## 2017-04-11 ENCOUNTER — Non-Acute Institutional Stay (SKILLED_NURSING_FACILITY): Payer: Medicare Other | Admitting: Internal Medicine

## 2017-04-11 ENCOUNTER — Encounter: Payer: Self-pay | Admitting: Internal Medicine

## 2017-04-11 ENCOUNTER — Ambulatory Visit (HOSPITAL_COMMUNITY): Payer: Medicare Other

## 2017-04-11 DIAGNOSIS — G40A09 Absence epileptic syndrome, not intractable, without status epilepticus: Secondary | ICD-10-CM

## 2017-04-11 DIAGNOSIS — N184 Chronic kidney disease, stage 4 (severe): Secondary | ICD-10-CM | POA: Diagnosis not present

## 2017-04-11 DIAGNOSIS — R4020 Unspecified coma: Secondary | ICD-10-CM | POA: Diagnosis not present

## 2017-04-11 LAB — BASIC METABOLIC PANEL
BUN: 104 — AB (ref 4–21)
Creatinine: 5.1 — AB (ref 0.6–1.3)
GLUCOSE: 234
POTASSIUM: 5.1 (ref 3.4–5.3)
SODIUM: 149 — AB (ref 137–147)

## 2017-04-11 LAB — CBC AND DIFFERENTIAL
HEMATOCRIT: 30 — AB (ref 41–53)
Hemoglobin: 9 — AB (ref 13.5–17.5)
NEUTROS ABS: 4
Platelets: 103 — AB (ref 150–399)
WBC: 5.3

## 2017-04-11 NOTE — Patient Instructions (Signed)
See assessment and plan under each diagnosis in the problem list and acutely for this visit Total time 46  minutes; greater than 50% of the visit spent counseling patient's family and coordinating care for problems addressed at this encounter

## 2017-04-11 NOTE — Assessment & Plan Note (Signed)
Trial of Keppra 250 mg bid

## 2017-04-11 NOTE — Assessment & Plan Note (Signed)
Decision had not changed as to dialysis. He has exhibited progressive renal insufficiency. The pathologic significance of this in relation to his mental status was discussed with his wife, daughter, and sister-in-law. Sister-in-law is PA.

## 2017-04-11 NOTE — Progress Notes (Signed)
    This is a nursing facility follow up for specific acute issue of acute mental status changes  Interim medical record and care since last [place] visit was updated with review of diagnostic studies and change in clinical status since last visit were documented.  HPI: The patient had similar symptoms 04/01/17 and was found to have a UTI which was treated. The last several days his oral intake has been decreasing and he has been somewhat more lethargic. After eating today he became unresponsive prompting the request for assessment. He has had progressive renal insufficiency. He has adamantly declined to pursue dialysis. This is a difficult decision for his adult children to accept; his wife has acquiesced to his wishes. Creatinine was 3.2 in July 2018. It has progressed to 3.9-4 range. He's had at least 2 strokes in 2008 and 2016. He has a thyroid disorder, TSH was therapeutic 03/10/17.  Review of systems: Could not be completed as the patient was comatose  Physical exam:  Pertinent or positive findings: He does not respond to noxious stimuli such as pressure at the superior orbit or pinching the skin over the upper chest. He exhibits  pattern alopecia. When eyelids are retracted he exhibits intermittent disconjugate gaze. There is no focus. Pupils are small, almost pinpoint. There is subtle anisocoria with the left pupil bigger than the right. Reaction to light is very sluggish. He resisted oral exam. Heart sounds are distant but heard in the epigastrium. The rate is slow and slightly irregular. He exhibits diffuse low-grade expiratory rhonchi. Abdomen is soft . Upper extremities are completely limp. There is increased tone in the right lower extremity. The left lower extremity is atrophied and contracted. Pedal pulses are decreased.  General appearance:Adequately nourished; no acute distress , increased work of breathing is present.   Lymphatic: No lymphadenopathy about the  head, neck, axilla . Eyes: No conjunctival inflammation or lid edema is present. There is no scleral icterus. Ears:  External ear exam shows no significant lesions or deformities.   Nose:  External nasal examination shows no deformity or inflammation. Nasal mucosa are pink and moist without lesions ,exudates Oral exam: lips and gums are healthy appearing.There is no oropharyngeal erythema or exudate . Neck:  No thyromegaly, masses, tenderness noted.    Heart:  No gallop, murmur, click, rub .  Lungs: without wheezes,rales , rubs. Abdomen:Bowel sounds are normal. Abdomen is soft and nontender with no organomegaly, hernias,masses. GU: deferred  Extremities:  No cyanosis, clubbing,edema  Skin: Warm & dry w/o tenting. No significant lesions or rash.  See summary under each active problem in the Problem List with associated updated therapeutic plan   Marnee Guarneri [facility] NP & I were called back to his room by his family. His wife gave him coffee and he became dramatically more alert. On exam he was still not conversant but was swallowing food and liquids without apparent difficulty. When the lids were retracted he exhibits slow intermittent right lateral nystagmus. His chest was significantly clearer without rhonchi. Heart sounds were audible rather than distant. The clinical features suggest an absence seizure clinical picture rather than TIA or catastrophic stroke. Also mental status improvement would not be expected if he did have irreversible,progressive end-stage kidney disease. Chemistries are still pending.

## 2017-04-29 ENCOUNTER — Ambulatory Visit: Payer: Medicare Other | Admitting: Infectious Diseases

## 2017-05-02 ENCOUNTER — Inpatient Hospital Stay (HOSPITAL_COMMUNITY): Admission: RE | Admit: 2017-05-02 | Payer: Medicare Other | Source: Ambulatory Visit

## 2017-06-24 ENCOUNTER — Non-Acute Institutional Stay (SKILLED_NURSING_FACILITY): Payer: Medicare Other | Admitting: Internal Medicine

## 2017-06-24 ENCOUNTER — Encounter: Payer: Self-pay | Admitting: Internal Medicine

## 2017-06-24 DIAGNOSIS — E1151 Type 2 diabetes mellitus with diabetic peripheral angiopathy without gangrene: Secondary | ICD-10-CM | POA: Diagnosis not present

## 2017-06-24 DIAGNOSIS — G40A09 Absence epileptic syndrome, not intractable, without status epilepticus: Secondary | ICD-10-CM | POA: Diagnosis not present

## 2017-06-24 DIAGNOSIS — N189 Chronic kidney disease, unspecified: Secondary | ICD-10-CM | POA: Diagnosis not present

## 2017-06-24 DIAGNOSIS — D631 Anemia in chronic kidney disease: Secondary | ICD-10-CM | POA: Diagnosis not present

## 2017-06-24 DIAGNOSIS — I69319 Unspecified symptoms and signs involving cognitive functions following cerebral infarction: Secondary | ICD-10-CM | POA: Diagnosis not present

## 2017-06-24 DIAGNOSIS — I63031 Cerebral infarction due to thrombosis of right carotid artery: Secondary | ICD-10-CM

## 2017-06-24 NOTE — Progress Notes (Signed)
    NURSING HOME LOCATION:  [facility] ROOM NUMBER:  130-A  CODE STATUS:  DNR  PCP:  Hendricks Limes, MD  [place] [address]  This is a nursing facility follow up of chronic medical diagnoses  Interim medical record and care since last [facility] visit was updated with review of diagnostic studies and change in clinical status since last visit were documented.  HPI: He has had no recurrences of the dramatic mental status change of unresponsiveness. Empirically he was treated for possible seizure disorder with altered mental status suggesting postictal state. He did see Dr Lorrene Reid, Nephrologist who recommended no further intervention. That note was reviewed. He has exhibited some weight loss despite improved oral intake. Random glucoses have ranged from a low fasting of 124 up to 247. For the most part they are less than 200. A1c was 6.7% on 03/10/17. His most recent creatinine was 5.1 and BUN 104 on 1/18. His anemia had progressed slightly with hemoglobin value of 9 and hematocrit of 30.This is attributed to progressive CKD as no bleeding dyscrasias reported.  Review of systems: Initially his said "no" to all questions; shaking his head side to side. Shortly thereafter he became nonverbal.  Cardiovascular: No chest pain, palpitations, paroxysmal nocturnal dyspnea, claudication, edema  Respiratory: No cough, sputum production, hemoptysis, DOE , significant snoring, apnea   Gastrointestinal: No heartburn, dysphagia, abdominal pain, nausea /vomiting, rectal bleeding, melena, change in bowels Genitourinary: No dysuria, hematuria, pyuria, incontinence, nocturia Musculoskeletal: No joint stiffness, joint swelling, weakness, pain Dermatologic: No rash, pruritus, change in appearance of skin Psychiatric: No significant anxiety, depression, insomnia, anorexia Hematologic/lymphatic: No significant bruising, lymphadenopathy, abnormal bleeding  Physical exam:  Pertinent  or positive findings: After the initial monosyllabic answers, he declined to answer further questions. He also refused to follow commands. He exhibited bizarre behavior of rotating his head laterally side-to-side and rubbing the crown of his head. He exhibited no spontaneous movement of the lower extremities. Pattern alopecia is present. Exotropia is present on the right. He has scattered caries. He has expiratory rhonchi which are symmetric. Rhythm is regular. Breath sounds are decreased. Clinically abdomen is nontender.  Pulses are decreased. General appearance: Adequately nourished; no acute distress, increased work of breathing is present.   Lymphatic: No lymphadenopathy about the head, neck, axilla. Eyes: No conjunctival inflammation or lid edema is present. There is no scleral icterus. Ears:  External ear exam shows no significant lesions or deformities.   Nose:  External nasal examination shows no deformity or inflammation. Nasal mucosa are pink and moist without lesions, exudates Oral exam:  Lips and gums are healthy appearing. There is no oropharyngeal erythema or exudate. Neck:  No thyromegaly, masses, tenderness noted.    Heart:  No gallop, murmur, click, rub .  Lungs: without wheezes, rhonchi,rales , rubs. Abdomen:Bowel sounds are normal. Abdomen is soft with no organomegaly, hernias,masses. GU: deferred  Extremities:  No cyanosis, clubbing,edema  Skin: Warm & dry w/o tenting. No significant lesions or rash.  See summary under each active problem in the Problem List with associated updated therapeutic plan

## 2017-06-24 NOTE — Assessment & Plan Note (Addendum)
B12 and thyroid functions are current and within normal limits Present mental status may reflect progressive renal insufficiency; BMET ordered

## 2017-06-24 NOTE — Assessment & Plan Note (Signed)
CBC

## 2017-06-24 NOTE — Assessment & Plan Note (Addendum)
06/24/17 mental status changes may reflect progressive microvascular disease or renal insufficiency; BMET will be checked.

## 2017-06-24 NOTE — Assessment & Plan Note (Signed)
BMET, A1c

## 2017-06-25 NOTE — Assessment & Plan Note (Signed)
Continue Plavix.  ?

## 2017-06-25 NOTE — Patient Instructions (Signed)
See assessment and plan under each diagnosis in the problem list and acutely for this visit 

## 2017-06-26 LAB — CBC AND DIFFERENTIAL
HCT: 27 — AB (ref 41–53)
HEMOGLOBIN: 8.2 — AB (ref 13.5–17.5)
NEUTROS ABS: 4
PLATELETS: 120 — AB (ref 150–399)
WBC: 6.1

## 2017-06-26 LAB — BASIC METABOLIC PANEL
BUN: 66 — AB (ref 4–21)
Creatinine: 4.5 — AB (ref 0.6–1.3)
GLUCOSE: 156
Potassium: 5.5 — AB (ref 3.4–5.3)
Sodium: 137 (ref 137–147)

## 2017-06-26 LAB — HEMOGLOBIN A1C: Hemoglobin A1C: 7.3

## 2017-07-23 LAB — LIPID PANEL
Cholesterol: 110 (ref 0–200)
HDL: 40 (ref 35–70)
LDL Cholesterol: 63
LDl/HDL Ratio: 2.8
Triglycerides: 38 — AB (ref 40–160)

## 2017-08-22 LAB — HM DIABETES EYE EXAM

## 2017-10-14 ENCOUNTER — Non-Acute Institutional Stay (SKILLED_NURSING_FACILITY): Payer: Medicare Other | Admitting: Adult Health

## 2017-10-14 ENCOUNTER — Encounter: Payer: Self-pay | Admitting: Adult Health

## 2017-10-14 DIAGNOSIS — N189 Chronic kidney disease, unspecified: Secondary | ICD-10-CM

## 2017-10-14 DIAGNOSIS — E039 Hypothyroidism, unspecified: Secondary | ICD-10-CM

## 2017-10-14 DIAGNOSIS — D631 Anemia in chronic kidney disease: Secondary | ICD-10-CM

## 2017-10-14 DIAGNOSIS — N400 Enlarged prostate without lower urinary tract symptoms: Secondary | ICD-10-CM

## 2017-10-14 DIAGNOSIS — E1151 Type 2 diabetes mellitus with diabetic peripheral angiopathy without gangrene: Secondary | ICD-10-CM

## 2017-10-14 DIAGNOSIS — I10 Essential (primary) hypertension: Secondary | ICD-10-CM

## 2017-10-14 DIAGNOSIS — E782 Mixed hyperlipidemia: Secondary | ICD-10-CM

## 2017-10-14 DIAGNOSIS — E875 Hyperkalemia: Secondary | ICD-10-CM

## 2017-10-14 DIAGNOSIS — Z8673 Personal history of transient ischemic attack (TIA), and cerebral infarction without residual deficits: Secondary | ICD-10-CM

## 2017-10-14 DIAGNOSIS — D696 Thrombocytopenia, unspecified: Secondary | ICD-10-CM

## 2017-10-14 DIAGNOSIS — G40A09 Absence epileptic syndrome, not intractable, without status epilepticus: Secondary | ICD-10-CM

## 2017-10-14 LAB — CBC AND DIFFERENTIAL
HEMATOCRIT: 32 — AB (ref 41–53)
HEMOGLOBIN: 9.8 — AB (ref 13.5–17.5)
NEUTROS ABS: 2
PLATELETS: 79 — AB (ref 150–399)
WBC: 2.9

## 2017-10-14 LAB — BASIC METABOLIC PANEL
BUN: 99 — AB (ref 4–21)
Creatinine: 4.8 — AB (ref 0.6–1.3)
Glucose: 104
Potassium: 5.7 — AB (ref 3.4–5.3)
Sodium: 154 — AB (ref 137–147)

## 2017-10-14 LAB — TSH: TSH: 2.96 (ref 0.41–5.90)

## 2017-10-14 LAB — HEPATIC FUNCTION PANEL
ALT: 26 (ref 10–40)
AST: 17 (ref 14–40)
Alkaline Phosphatase: 113 (ref 25–125)
Bilirubin, Total: 0.2

## 2017-10-14 LAB — HEMOGLOBIN A1C: HEMOGLOBIN A1C: 6.8

## 2017-10-14 NOTE — Progress Notes (Signed)
Location:  [facility] Room Number: 259-D Place of Service:  SNF (31) Provider:  Durenda Age, NP  Patient Care Team: Hendricks Limes, MD as PCP - General (Internal Medicine) Nolene Ebbs, MD (Internal Medicine) [name], MD as Consulting Physician (Ophthalmology) Jamal Maes, MD as Consulting Physician (Nephrology) Josue Hector, MD as Consulting Physician (Cardiology)  Extended Emergency Contact Information Primary Emergency Contact: Belizaire,Victoria Address: [place], [place] [address] [place] Phone: [phone] Mobile Phone: [phone] Relation: Spouse Secondary Emergency Contact: Martindale,Temilola Address: [address], [place] [address] [place] Mobile Phone: [phone] Relation: Daughter  Code Status:  DNR  Goals of care: Advanced Directive information Advanced Directives 10/14/2017  Does Patient Have a Medical Advance Directive? Yes  Type of Advance Directive Out of facility DNR (pink MOST or yellow form)  Does patient want to make changes to medical advance directive? No - Patient declined  Would patient like information on creating a medical advance directive? -     Chief Complaint  Patient presents with  . Medical Management of Chronic Issues    The patient is seen for a routine [facility] visit    HPI:  Pt is a 72 y.o. male seen today for medical management of chronic diseases.  He is a long-term care resident of [facility].  He is also under the care of [facility].  He has a PMH of CKD stage IV and refuses dialysis, IDDM, chronic combined systolic and diastolic heart failure, history of hepatitis C, hypertension, and depression. He was seen in his bed with his eyes closed. He is verbally responsive but words are not clear. Latest labs showed with K  5.7, BUN 98.7, creatinine 4.78, GFR <15. He does not  wish to have dialysis and currently followed by hospice. BPs noted to be elevated - 178/81, 151/80, 169/88, 145/75. Platelet 79, low.    Past Medical History:  Diagnosis Date  . AKI (acute kidney injury) ([facility])   . Altered mental status 08/31/2015  . Bedridden    transfers to chair  . Cerebrovascular disease    a. 01/2007 MRI/A: L MCA 54-27%, R MCA 06%, LICA 23%.  . Chronic combined systolic and diastolic CHF, NYHA class 2 ([facility])    a. 01/2007 Echo: EF 60-65%;  b. 03/2014 Echo: EF 45-50%, Gr 1 DD, severe LVH.  Marland Kitchen Chronic kidney disease (CKD), stage IV (severe) ([facility] [place]) 05/27/2014  . Chronic kidney disease with active medical management without dialysis, unspecified stage 09/23/2016  . Depression   . GERD (gastroesophageal reflux disease)   . Glaucoma   . Headache   . Heme positive stool 06/18/2016  . Hepatitis dx'd 03/2014   C  . Hepatitis C infection 01/04/2015   - Pt with HCV (+) antibody and HCV RNA Quant Log = 5.87 (H) (04/10/14)  . History of cerebrovascular accident with residual effects    a. 01/2007 MRI/A: L MCA 76-28%, R MCA 31%, LICA 51%.   Marland Kitchen Hypercholesterolemia   . Hypertension   . Hypoglycemia 02/06/2015  . Hypothyroidism   . Pneumonia 10/2006  . Stroke ([facility]) 01/2007   L corona radiata infarct; "left his right side weak" (09/16/2014)  . Stroke [facility]) 03/2014   "while in hospital; left side extremely weak since" (09/16/2014)  . Type II diabetes mellitus ([facility])  Type II   Past Surgical History:  Procedure Laterality Date  . CATARACT EXTRACTION EXTRACAPSULAR Left 12/21/2014   Procedure: CATARACT EXTRACTION EXTRACAPSULAR WITH INTRAOCULAR LENS PLACEMENT LEFT EYE;  Surgeon: [name], MD;  Location: [facility];  Service: Ophthalmology;  Laterality: Left;  . COLON SURGERY     Polyps removed- surgically  . COLONOSCOPY  2015  . COLONOSCOPY W/ BIOPSIES AND POLYPECTOMY  2002  . TRABECULECTOMY Left 08/18/2013   Procedure: TRABECULECTOMY WITH TUBE WITH [facility];  Surgeon: [name], MD;   Location: [facility];  Service: Ophthalmology;  Laterality: Left;    No Known Allergies  Outpatient Encounter Medications as of 10/14/2017  Medication Sig  . acetaminophen (TYLENOL) 325 MG tablet Take 650 mg by mouth every 6 (six) hours as needed.  Marland Kitchen amLODipine (NORVASC) 2.5 MG tablet Take 2.5 mg by mouth daily.  Marland Kitchen atorvastatin (LIPITOR) 40 MG tablet Take 40 mg by mouth daily.  . bimatoprost (LUMIGAN) 0.01 % SOLN Place 1 drop into the right eye at bedtime.  . brimonidine-timolol (COMBIGAN) 0.2-0.5 % ophthalmic solution Place 1 drop into the right eye every 12 (twelve) hours.  . calcitRIOL (ROCALTROL) 0.25 MCG capsule Take 0.25 mcg by mouth daily.   . Calcium Citrate 200 MG TABS Take 1 tablet by mouth 2 (two) times daily.  . carvedilol (COREG) 25 MG tablet Take 25 mg by mouth 2 (two) times daily with a meal.   . Cholecalciferol (VITAMIN D3) 50000 units TABS Take 50,000 Units by mouth every 30 (thirty) days.  . cloNIDine (CATAPRES - DOSED IN MG/24 HR) 0.1 mg/24hr patch Place 0.1 mg onto the skin once a week.  . clopidogrel (PLAVIX) 75 MG tablet Take 1 tablet (75 mg total) by mouth daily.  . ferrous sulfate 325 (65 FE) MG tablet Take 325 mg by mouth 2 (two) times daily with a meal. To be taken with ascorbic acid  . furosemide (LASIX) 80 MG tablet Take 80 mg by mouth daily.  Marland Kitchen glucagon (GLUCAGON EMERGENCY) 1 MG injection Inject 1 mg into the vein once as needed (hypoglycemia).   . hydrALAZINE (APRESOLINE) 50 MG tablet Take 50 mg by mouth 3 (three) times daily.   . insulin glargine (LANTUS) 100 UNIT/ML injection Inject 10 Units into the skin daily.   Marland Kitchen ketorolac (ACULAR) 0.4 % SOLN Place 1 drop into the left eye 4 (four) times daily.  Marland Kitchen levETIRAcetam (KEPPRA) 250 MG tablet Take 1 tablet (250 mg total) by mouth 2 (two) times daily.  Marland Kitchen levothyroxine (SYNTHROID, LEVOTHROID) 100 MCG tablet Take 100 mcg by mouth daily before breakfast.   . linagliptin (TRADJENTA) 5 MG TABS tablet Take 5 mg by mouth daily.    . Menthol, Topical Analgesic, 4 % GEL Apply 1 application topically 3 (three) times daily as needed (Pain). Apply to legs  . NUTRITIONAL SUPPLEMENT LIQD Take 120 mLs by mouth 2 (two) times daily. MedPass  . Nutritional Supplements (NUTRITIONAL SUPPLEMENT PO) Take 1 each by mouth 2 (two) times daily. Magic Cup  . ondansetron (ZOFRAN) 4 MG tablet Take 4 mg by mouth every 8 (eight) hours as needed for nausea or vomiting.  . polyethylene glycol (MIRALAX / GLYCOLAX) packet Take 17 g by mouth 2 (two) times daily.  . sennosides-docusate sodium (SENOKOT-S) 8.6-50 MG tablet Take 2 tablets by mouth 2 (two) times daily.   . sodium bicarbonate 650 MG tablet Take 650 mg by mouth 2 (two) times daily.  . tamsulosin (FLOMAX) 0.4 MG CAPS capsule Take 0.8 mg by  mouth at bedtime.   . vitamin C (ASCORBIC ACID) 500 MG tablet Take 500 mg by mouth 2 (two) times daily.  . [DISCONTINUED] OVER THE COUNTER MEDICATION Take 1 scoop by mouth 2 (two) times daily. Protein powder mixed with food/drink (25 g of protein per serving) PURE PROTEIN  . [DISCONTINUED] prednisoLONE acetate (PRED FORTE) 1 % ophthalmic suspension Place 1 drop into the left eye 4 (four) times daily.   No facility-administered encounter medications on file as of 10/14/2017.     Review of Systems  Unable to obtain, does not answer queries    Immunization History  Administered Date(s) Administered  . Influenza-Unspecified 01/04/2015, 12/22/2015, 02/19/2017  . PPD Test 09/01/2015  . Pneumococcal Conjugate-13 09/27/2016  . Pneumococcal-Unspecified 03/25/2014   Pertinent  Health Maintenance Due  Topic Date Due  . FOOT EXAM  07/11/2017  . URINE MICROALBUMIN  09/27/2017  . COLONOSCOPY  06/25/2018 (Originally 04/28/2010)  . INFLUENZA VACCINE  10/23/2017  . HEMOGLOBIN A1C  12/26/2017  . OPHTHALMOLOGY EXAM  08/23/2018  . PNA vac Low Risk Adult  Completed   Fall Risk  03/11/2017 11/26/2016 02/22/2016 02/22/2016 08/23/2015  Falls in the past year? No No No  No No      Vitals:   10/14/17 1649 10/14/17 1819  BP: (!) 178/81 (!) 167/81  Pulse: 65   Resp: 16   Temp: (!) 97.4 F (36.3 C)   TempSrc: Oral   SpO2: 100%   Weight: 150 lb (68 kg)   Height: 5\' 5"  (1.651 m)    Body mass index is 24.96 kg/m.  Physical Exam  GENERAL APPEARANCE: Well nourished. In no acute distress. Normal body habitus SKIN:  Skin is warm and dry.  MOUTH and THROAT: Lips are without lesions. Oral mucosa is moist and without lesions. Tongue is normal in shape, size, and color and without lesions RESPIRATORY: Breathing is even & unlabored, BS CTAB CARDIAC: RRR, no murmur,no extra heart sounds GI: Abdomen soft, normal BS, no masses, no tenderness EXTREMITIES:  No edema, no cyanosis PSYCHIATRIC:  Affect and behavior are appropriate   Labs reviewed: Recent Labs    04/01/17 04/11/17 06/26/17  NA 143 149* 137  K 4.7 5.1 5.5*  BUN 56* 104* 66*  CREATININE 3.9* 5.1* 4.5*   Recent Labs    03/10/17  AST 11*  ALT 10  ALKPHOS 102   Recent Labs    04/01/17 04/11/17 06/26/17  WBC 5.1 5.3 6.1  NEUTROABS 3 4 4   HGB 10.1* 9.0* 8.2*  HCT 34* 30* 27*  PLT 137* 103* 120*   Lab Results  Component Value Date   TSH 2.47 03/10/2017   Lab Results  Component Value Date   HGBA1C 7.3 06/26/2017   Lab Results  Component Value Date   CHOL 110 07/23/2017   HDL 40 07/23/2017   LDLCALC 63 07/23/2017   TRIG 38 (A) 07/23/2017   CHOLHDL 4.6 04/12/2014    Assessment/Plan  1. Uncontrolled hypertension -  Will increase Amlodipine from 2.5 mg to 5 mg daily, continue Hydralazine 50 mg TID, Carvedilol 25 mg BID, Furosemide 80 mg daily, Clonidine 0.1 mg 1 patch/week   2. Diabetes mellitus type 2 with peripheral artery disease (HCC) - well-controlled, continue Lantus 100 units/ml inject 10 units SQ Q AM, Tradjenta 5 mg daily Lab Results  Component Value Date   HGBA1C 7.3 06/26/2017     3. Hypothyroidism, unspecified type - continue Levothyroxine 100 mcg 1 tab  daily Lab Results  Component Value Date  TSH 2.47 03/10/2017     4. Benign prostatic hyperplasia, unspecified whether lower urinary tract symptoms present - continue Tamsulosin 0.4 mg 1 capsule Q HS   5. Mixed hyperlipidemia - continue Lipitor 40 mg daily Lab Results  Component Value Date   CHOL 110 07/23/2017   HDL 40 07/23/2017   LDLCALC 63 07/23/2017   TRIG 38 (A) 07/23/2017   CHOLHDL 4.6 04/12/2014     6. Anemia of renal disease - GFR <15, on CKD stage 5, continue Sodium Bicarbonate 650 mg BID, Calcitriol 0.25 mg 1 capsule daily, and FeSO4 325 mg BID   7. History of cerebral infarction - continue Plavix 75 mg daily, Lipitor, Clonidine, Lasix, Amlodipine which will be increased to 5 mg daily, Hydralazine, Carvedilol   8. 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 Hyperkalemia -  K 5.7,  Will start Kayexalate 15 gm/60 ml PO X 1    10. Thrombocytopenia -  Platelet 79, no noted bleeding, will re-check in 1 week    Family/ staff Communication: Discussed plan of care with resident.  Labs/tests ordered:  BMP on 10/15/17, CBC in 1 week  Goals of care:   Long-term care/hospice.   Durenda Age, NP [facility] [phone] (Monday-Friday 8:00 a.m. - 5:00 p.m.) [phone] (after hours)

## 2017-10-15 LAB — BASIC METABOLIC PANEL
BUN: 97 — AB (ref 4–21)
Creatinine: 4.7 — AB (ref 0.6–1.3)
Glucose: 61
Potassium: 5.4 — AB (ref 3.4–5.3)
Sodium: 154 — AB (ref 137–147)

## 2017-10-16 LAB — BASIC METABOLIC PANEL
BUN: 97 — AB (ref 4–21)
Creatinine: 4.4 — AB (ref 0.6–1.3)
GLUCOSE: 97
Potassium: 4.6 (ref 3.4–5.3)
Sodium: 153 — AB (ref 137–147)

## 2017-10-17 ENCOUNTER — Other Ambulatory Visit: Payer: Self-pay | Admitting: Internal Medicine

## 2017-10-22 LAB — CBC AND DIFFERENTIAL
HEMATOCRIT: 28 — AB (ref 41–53)
HEMOGLOBIN: 8.8 — AB (ref 13.5–17.5)
Platelets: 64 — AB (ref 150–399)
WBC: 4.2

## 2017-11-23 DEATH — deceased
